# Patient Record
Sex: Male | Born: 1955 | Race: White | Hispanic: No | Marital: Single | Smoking: Former smoker
Health system: Southern US, Community
[De-identification: ages and names within clinical notes are randomized; demographics above are authoritative.]

## PROBLEM LIST (undated history)

## (undated) DIAGNOSIS — I2510 Atherosclerotic heart disease of native coronary artery without angina pectoris: Secondary | ICD-10-CM

## (undated) DIAGNOSIS — I209 Angina pectoris, unspecified: Secondary | ICD-10-CM

## (undated) DIAGNOSIS — I10 Essential (primary) hypertension: Secondary | ICD-10-CM

## (undated) DIAGNOSIS — J45909 Unspecified asthma, uncomplicated: Secondary | ICD-10-CM

## (undated) DIAGNOSIS — J449 Chronic obstructive pulmonary disease, unspecified: Secondary | ICD-10-CM

## (undated) DIAGNOSIS — D649 Anemia, unspecified: Secondary | ICD-10-CM

## (undated) DIAGNOSIS — G709 Myoneural disorder, unspecified: Secondary | ICD-10-CM

## (undated) DIAGNOSIS — G4730 Sleep apnea, unspecified: Secondary | ICD-10-CM

## (undated) HISTORY — PX: CORONARY ANGIOPLASTY: SHX604

## (undated) HISTORY — PX: BARIATRIC SURGERY: SHX1103

## (undated) HISTORY — PX: PNEUMONECTOMY: SHX168

## (undated) SURGERY — Surgical Case
Anesthesia: *Unknown

---

## 2008-10-10 ENCOUNTER — Inpatient Hospital Stay: Payer: Self-pay | Admitting: Internal Medicine

## 2008-10-10 ENCOUNTER — Ambulatory Visit: Payer: Self-pay | Admitting: Family Medicine

## 2008-10-25 ENCOUNTER — Ambulatory Visit: Payer: Self-pay | Admitting: Internal Medicine

## 2008-10-28 ENCOUNTER — Ambulatory Visit: Payer: Self-pay | Admitting: Internal Medicine

## 2008-12-21 ENCOUNTER — Ambulatory Visit: Payer: Self-pay | Admitting: Internal Medicine

## 2008-12-25 ENCOUNTER — Ambulatory Visit: Payer: Self-pay

## 2009-01-24 ENCOUNTER — Inpatient Hospital Stay: Payer: Self-pay | Admitting: General Surgery

## 2009-01-24 ENCOUNTER — Ambulatory Visit: Payer: Self-pay | Admitting: Internal Medicine

## 2009-02-06 ENCOUNTER — Ambulatory Visit: Payer: Self-pay | Admitting: General Surgery

## 2009-02-07 ENCOUNTER — Inpatient Hospital Stay: Payer: Self-pay | Admitting: General Surgery

## 2009-02-16 ENCOUNTER — Ambulatory Visit: Payer: Self-pay | Admitting: General Surgery

## 2009-02-19 ENCOUNTER — Ambulatory Visit: Payer: Self-pay | Admitting: General Surgery

## 2009-02-22 ENCOUNTER — Ambulatory Visit: Payer: Self-pay | Admitting: General Surgery

## 2009-02-26 ENCOUNTER — Ambulatory Visit: Payer: Self-pay | Admitting: General Surgery

## 2009-02-26 ENCOUNTER — Ambulatory Visit: Payer: Self-pay | Admitting: Unknown Physician Specialty

## 2009-02-27 ENCOUNTER — Ambulatory Visit: Payer: Self-pay | Admitting: General Surgery

## 2009-02-28 ENCOUNTER — Inpatient Hospital Stay: Payer: Self-pay | Admitting: General Surgery

## 2009-03-15 ENCOUNTER — Ambulatory Visit: Payer: Self-pay | Admitting: General Surgery

## 2009-04-13 ENCOUNTER — Ambulatory Visit: Payer: Self-pay | Admitting: General Surgery

## 2009-12-12 ENCOUNTER — Ambulatory Visit: Payer: Self-pay | Admitting: Pain Medicine

## 2010-01-18 IMAGING — CR DG CHEST 2V
1 series · 2 of 2 positions shown · non-contrast
Comparison: none

REASON FOR EXAM: pneumothorax, dyspnea
COMMENTS:

[Series 1: view not recorded · 0.17mm/px · 2 of 2 slices shown]
[im 1/2]
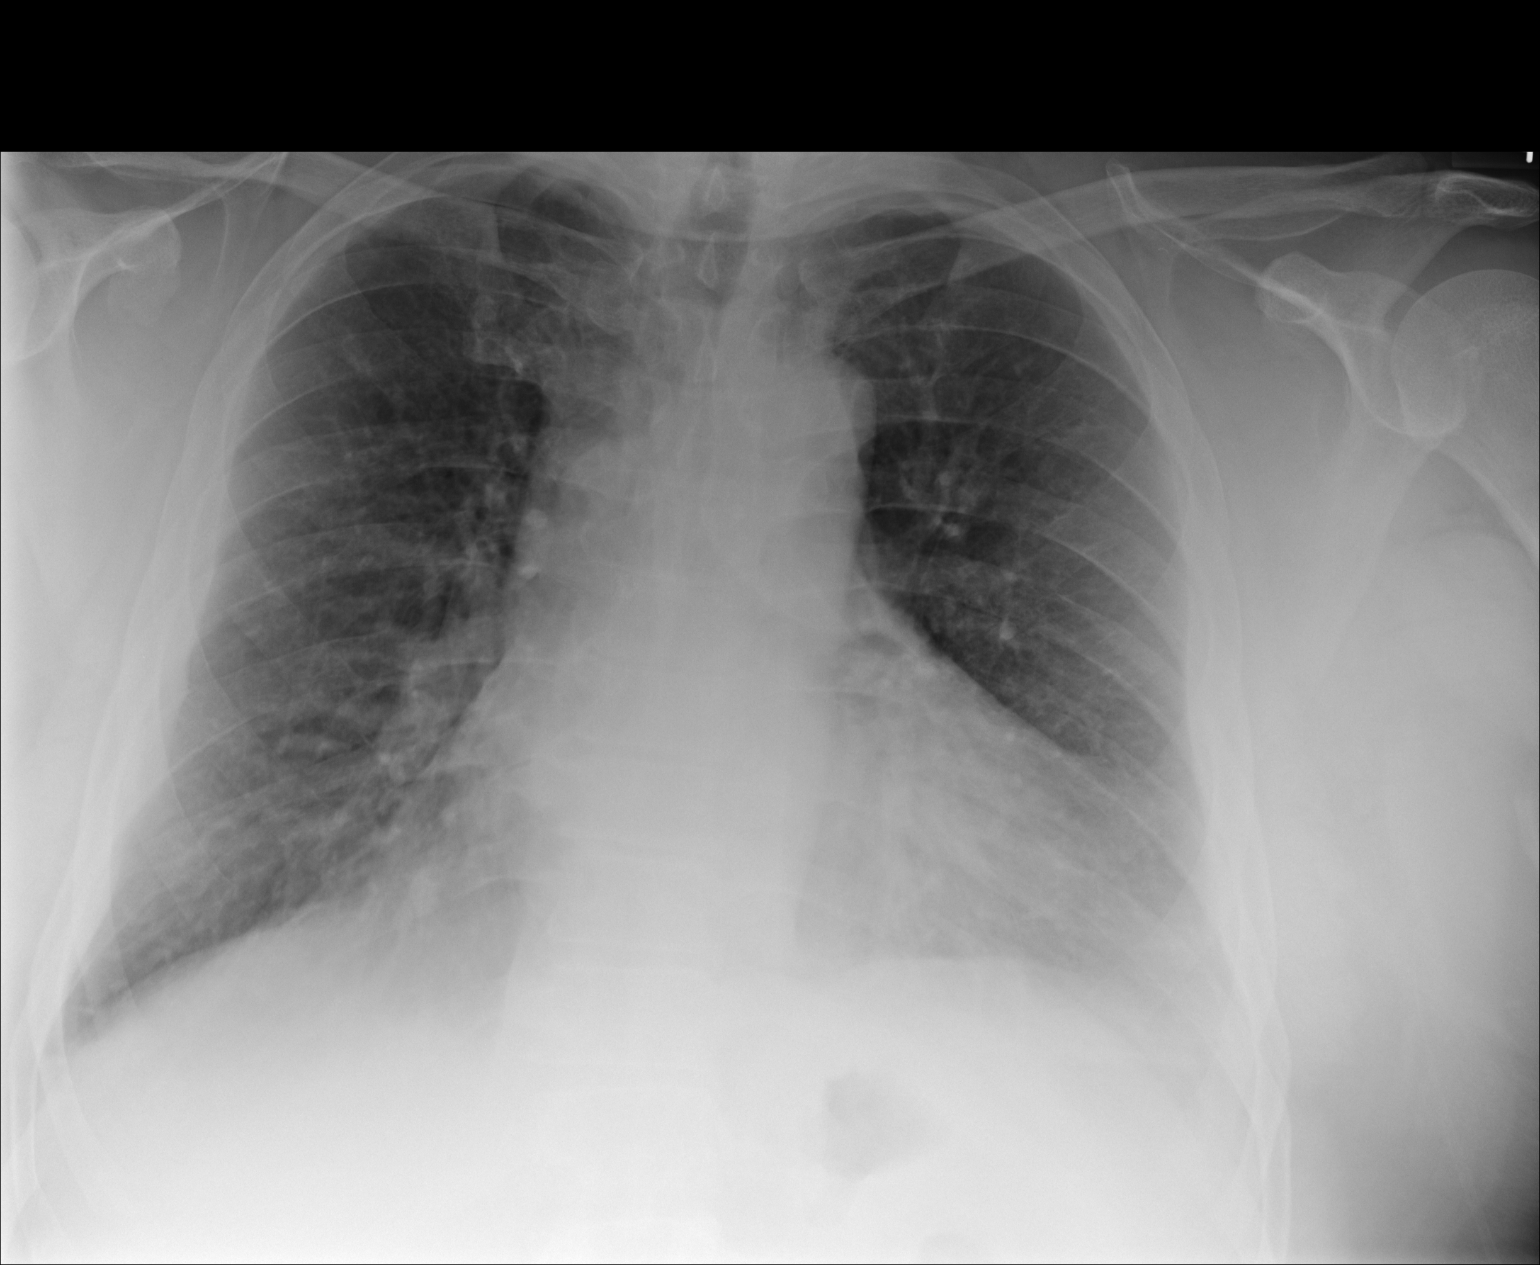
[im 2/2]
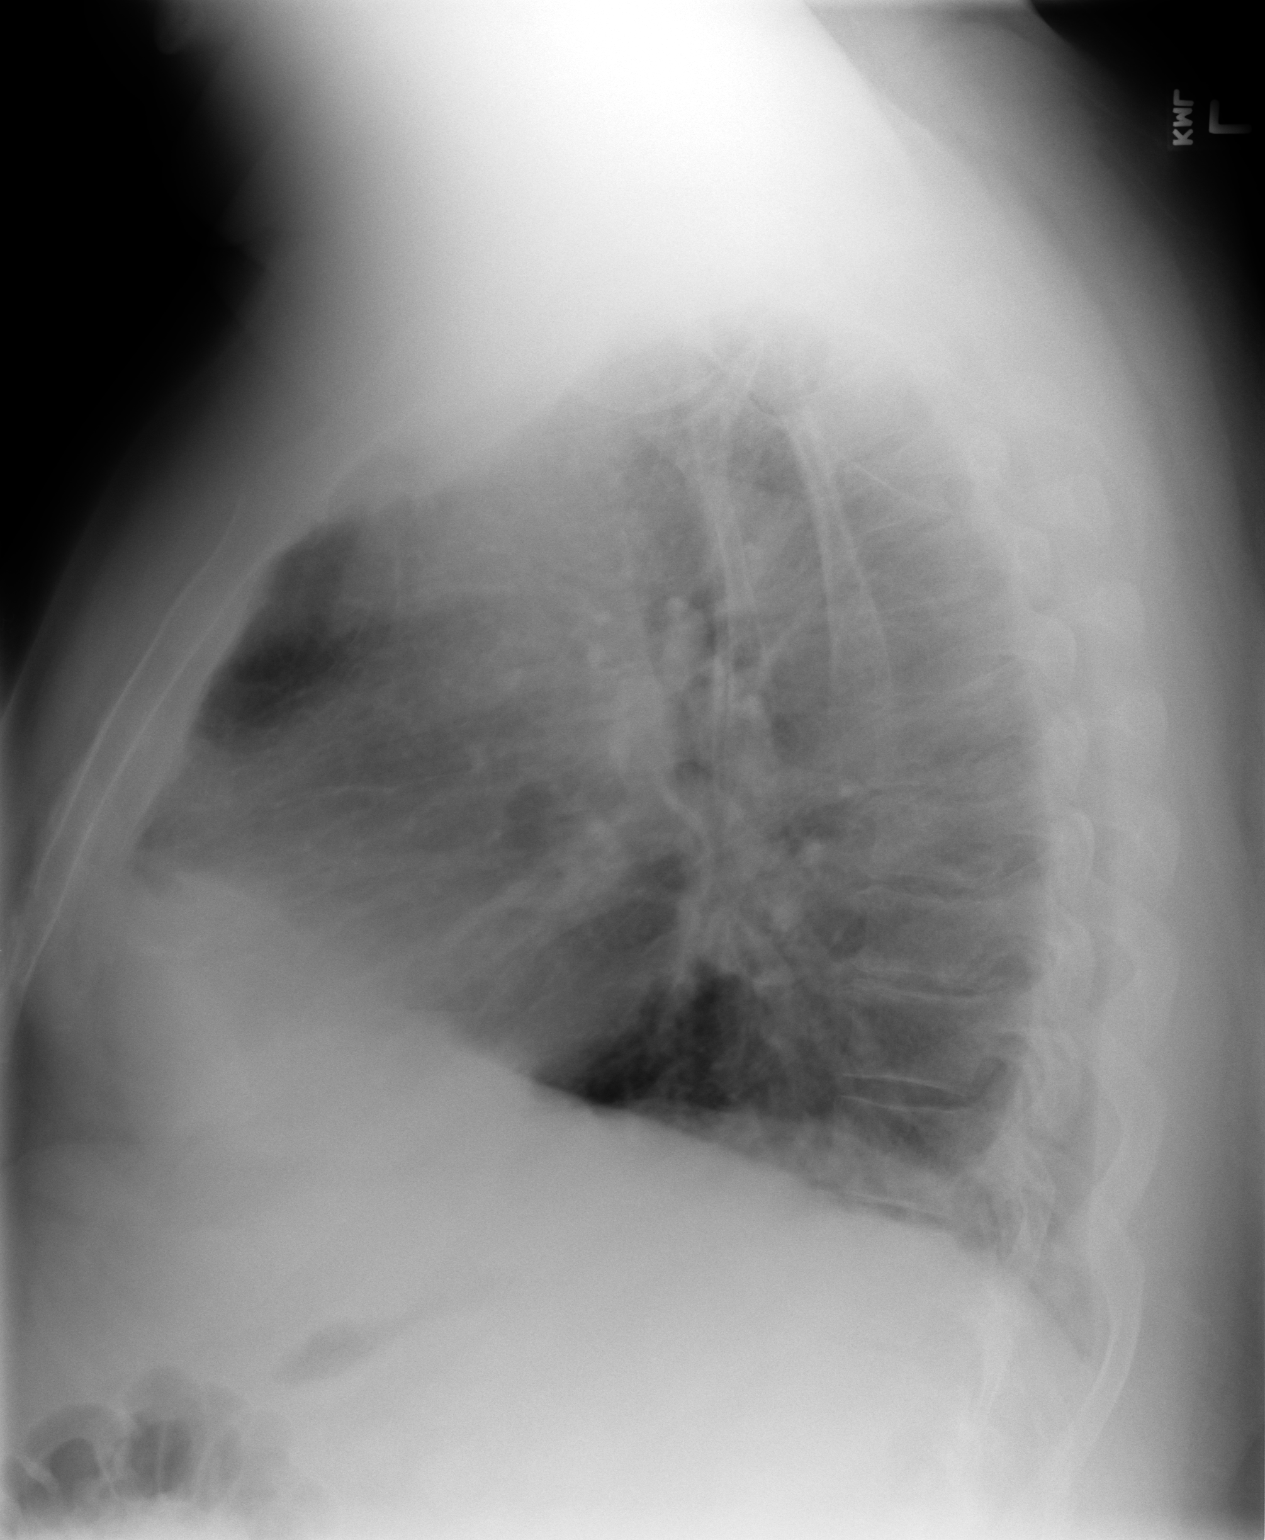

[2 of 2 positions shown; findings below may reference images not displayed]

PROCEDURE:     MDR - MDR CHEST PA(OR AP) AND LATERAL  - October 25, 2008  [DATE]

RESULT:     Comparison is made to the examination of 10/14/2008.

The cardiac silhouette is enlarged. The pulmonary markings are somewhat
prominent. There is some mild peribronchial cuffing and there is evidence of
interstitial edema. There is no significant effusion.
IMPRESSION: Cardiomegaly with mild pulmonary edema. No significant
change.

## 2010-05-05 IMAGING — CR DG CHEST 1V PORT
1 series · 1 of 1 positions shown · non-contrast
Comparison: none

REASON FOR EXAM: chest tube removed today, possible leak
COMMENTS:

PROCEDURE:     DXR - DXR PORTABLE CHEST SINGLE VIEW  - February 09, 2009  [DATE]
RESULT:     Comparison: 02/09/2009

[view not recorded]
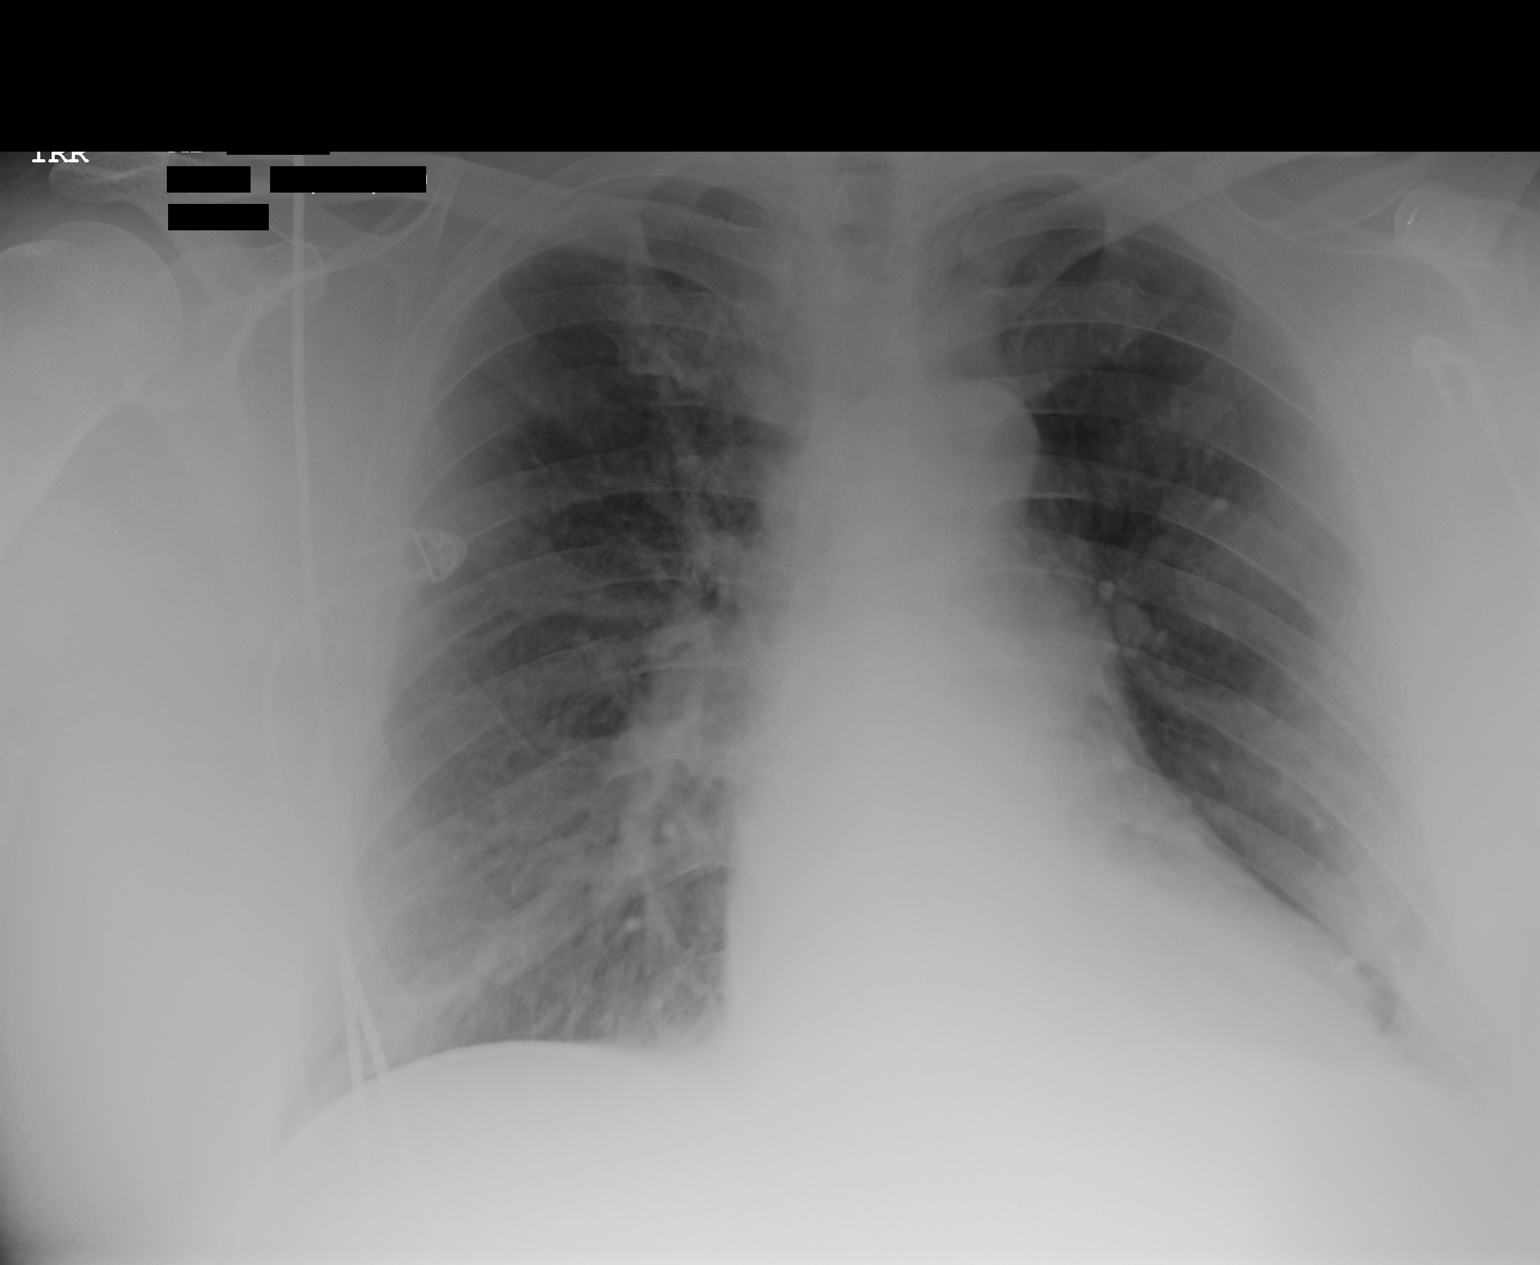

[1 of 1 positions shown; findings below may reference images not displayed]

FINDINGS: Single portable AP chest radiograph is provided. There has been interval
removal of an endotracheal tube. There is left basilar airspace disease
likely representing atelectasis. There is no focal parenchymal opacity,
pleural effusion, or pneumothorax. Normal cardiomediastinal silhouette. The
osseous structures are unremarkable.
IMPRESSION: No left pneumothorax.

## 2010-05-08 IMAGING — CR DG CHEST 1V PORT
1 series · 1 of 1 positions shown · non-contrast
Comparison: none

REASON FOR EXAM: hypoxia
COMMENTS:

PROCEDURE:     DXR - DXR PORTABLE CHEST SINGLE VIEW  - February 12, 2009 [DATE]
RESULT:     There is a minimal hazy density in the left mid lung field
compatible with mild atelectasis. No pneumothorax is seen. No acute changes
of the heart or pulmonary vasculature are identified.

[view not recorded]
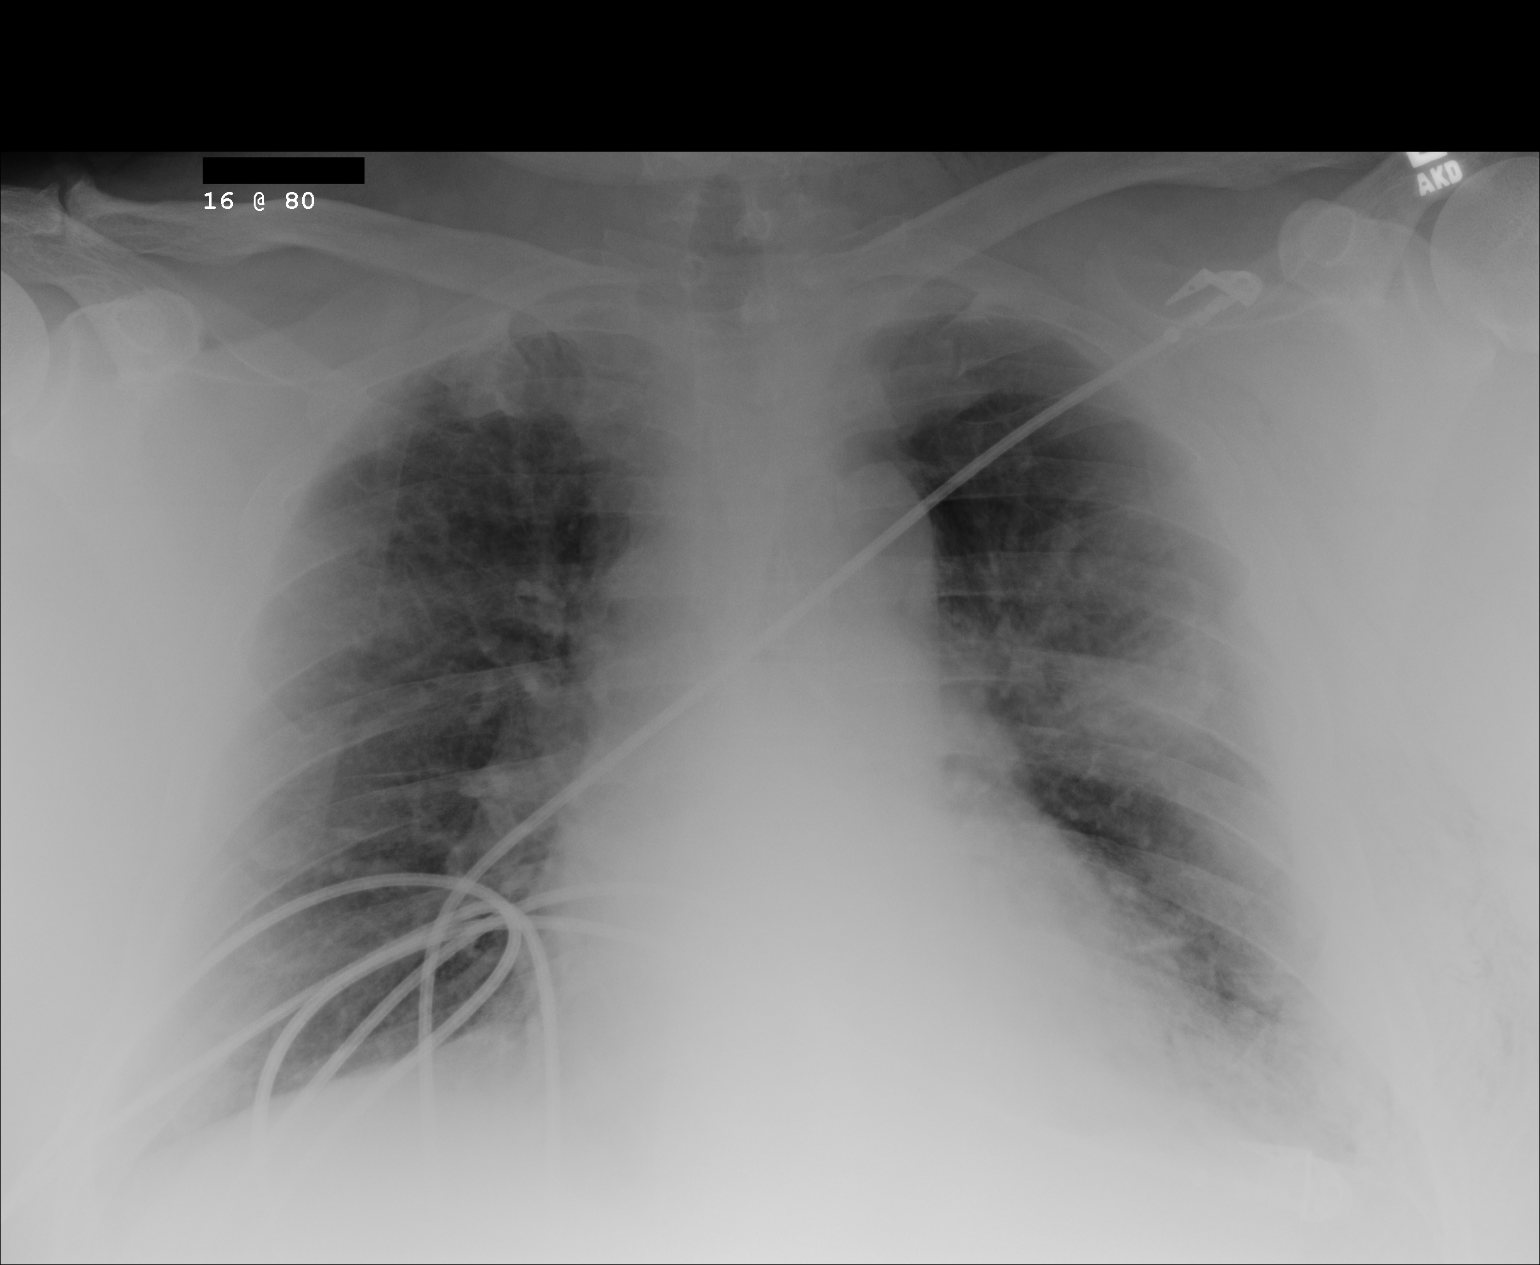

[1 of 1 positions shown; findings below may reference images not displayed]

IMPRESSION: There is a minimal hazy increase in density in the left mid
lung field most compatible with mild atelectasis. The lung fields otherwise
are clear.

## 2010-05-23 IMAGING — CR DG CHEST 2V
1 series · 3 of 3 positions shown · non-contrast
Comparison: none

REASON FOR EXAM: Shortness of breath, chest tube placement
COMMENTS:

[Series 1: view not recorded · 0.17mm/px · 3 of 3 slices shown]
[im 1/3]
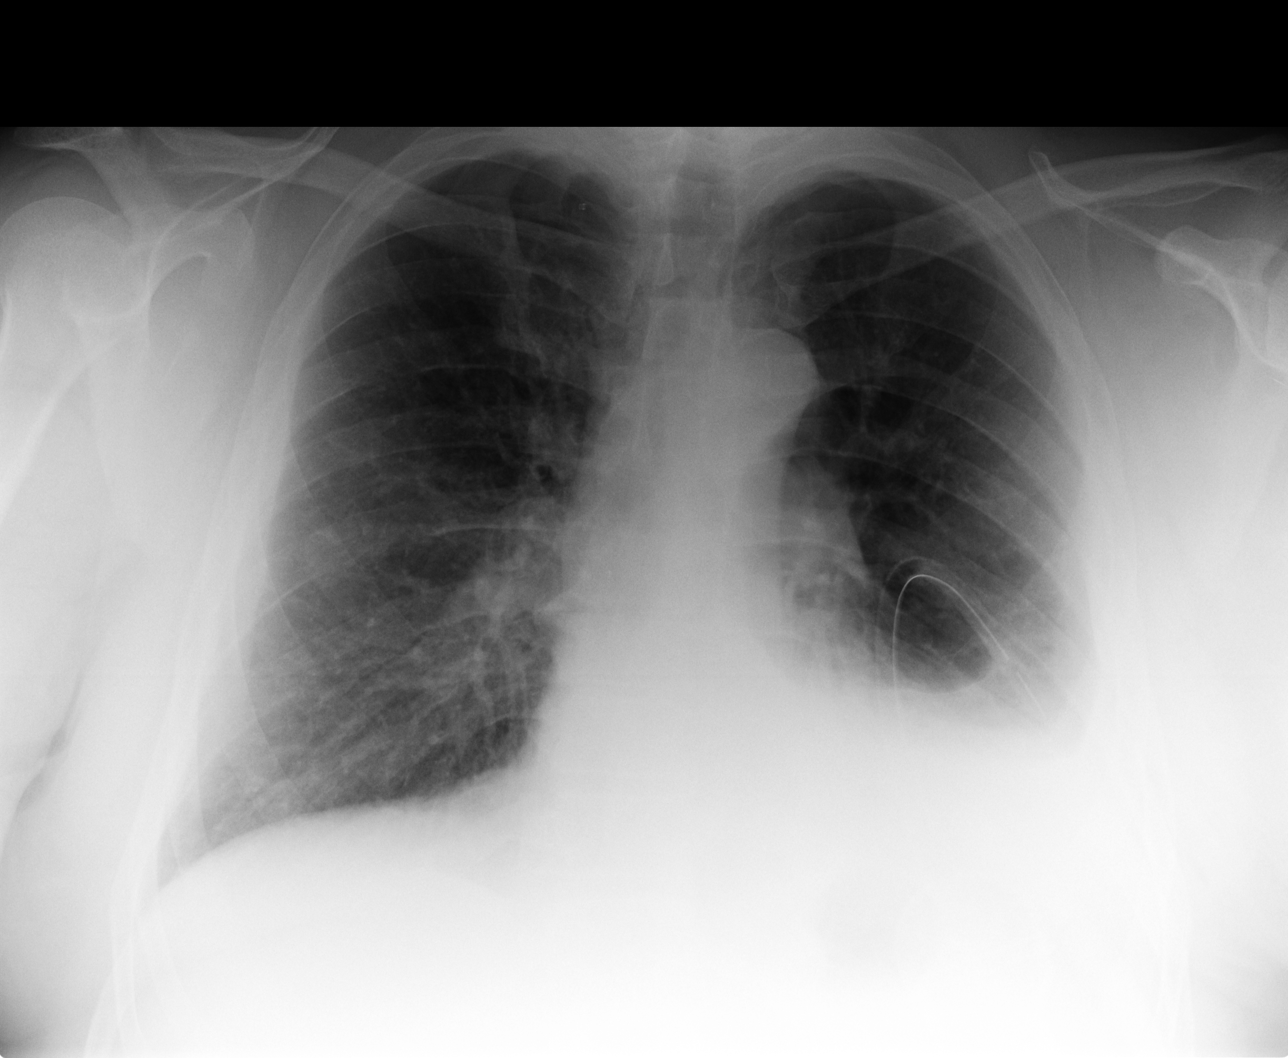
[im 2/3]
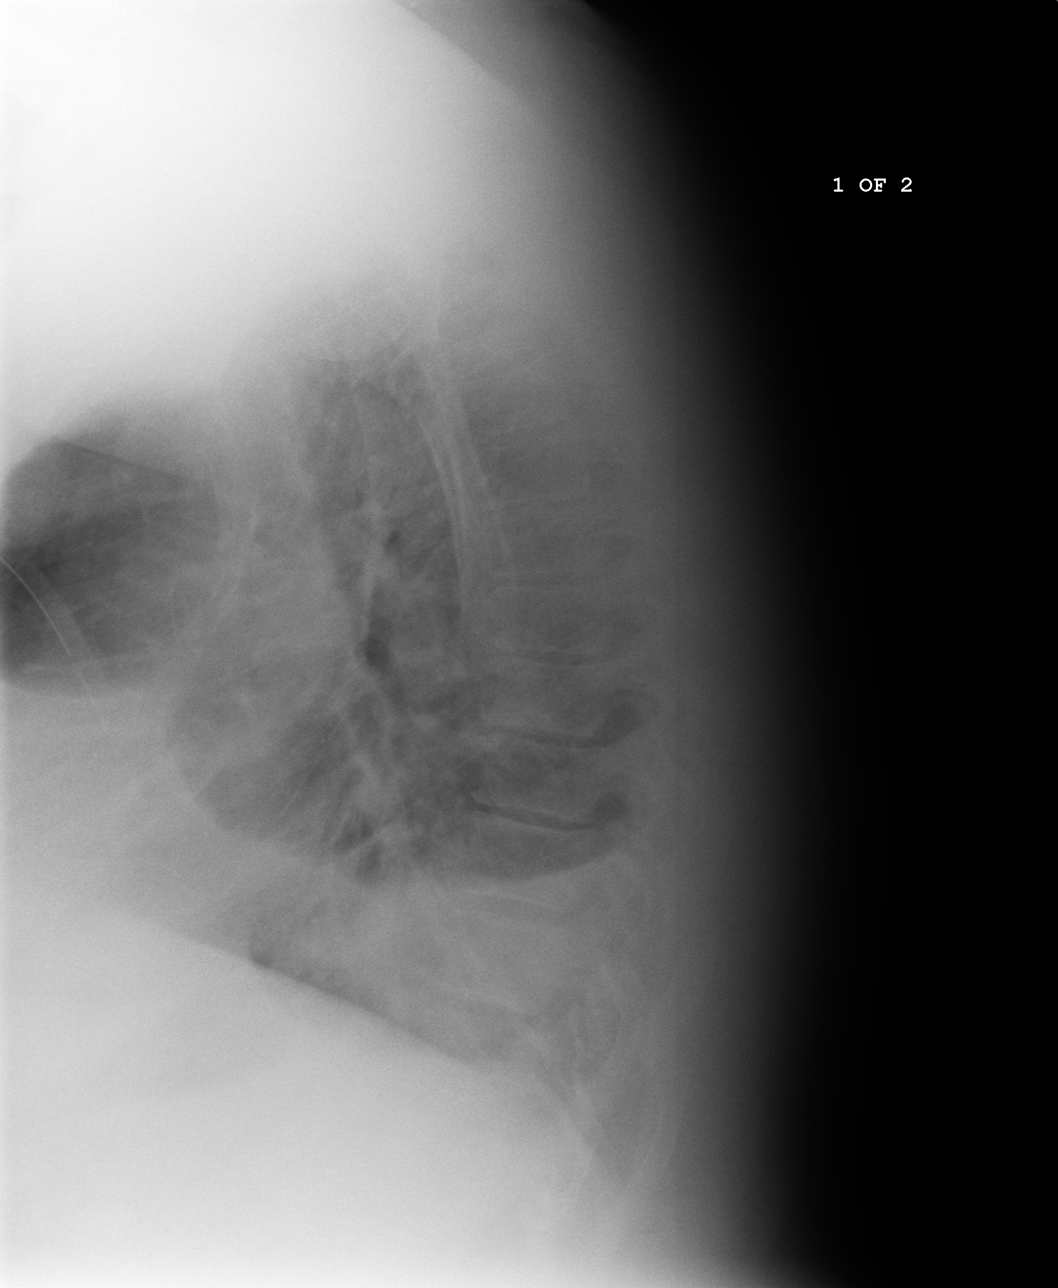
[im 3/3]
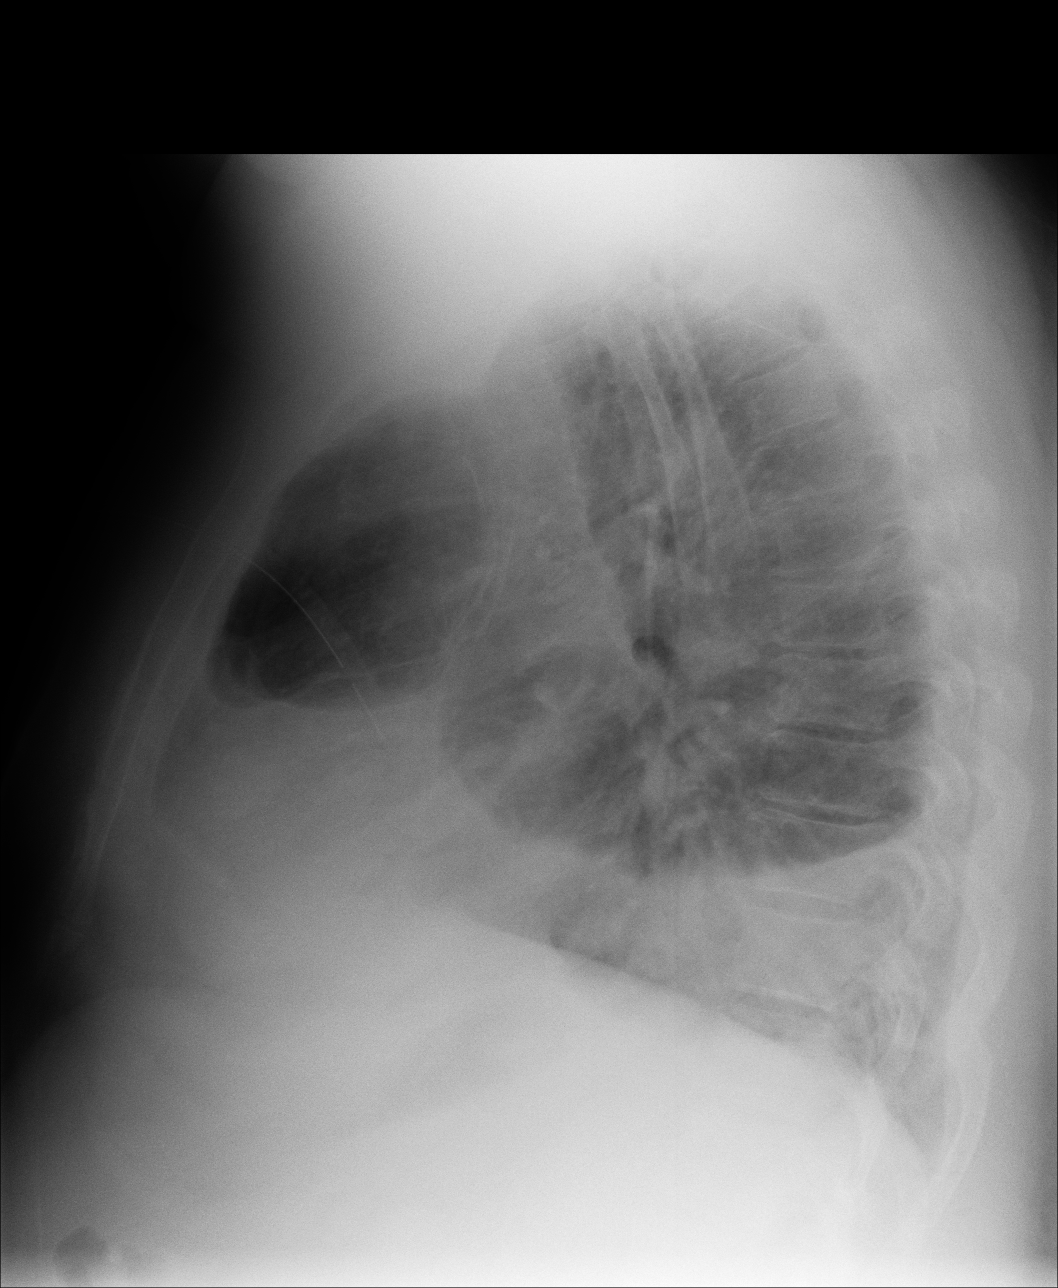

[3 of 3 positions shown; findings below may reference images not displayed]

PROCEDURE:     KDR - KDXR CHEST PA (OR AP) AND LAT  - February 27, 2009  [DATE]

RESULT:     Comparison is made to prior study dated 02/26/2009.

A left-sided chest tube has been placed in the interim. The tube tip is
positioned within the left lung base. A small residual left pleural effusion
is identified. The previously described pneumothorax is not appreciated on
the present study. The patient has taken a shallow inspiration. There is
thickening of the interstitial markings. The cardiac silhouette is within
the upper limits of normal. The visualized bony skeleton is unremarkable.
IMPRESSION: 1. Interval placement of left-sided chest tube.
2. Residual left pleural effusion without associated pneumothorax.
3. Pulmonary vascular congestion/mild edema.

## 2010-05-25 IMAGING — CR DG CHEST 1V PORT
1 series · 1 of 1 positions shown · non-contrast
Comparison: none

REASON FOR EXAM: left thoracoscopy
COMMENTS:

[view not recorded]
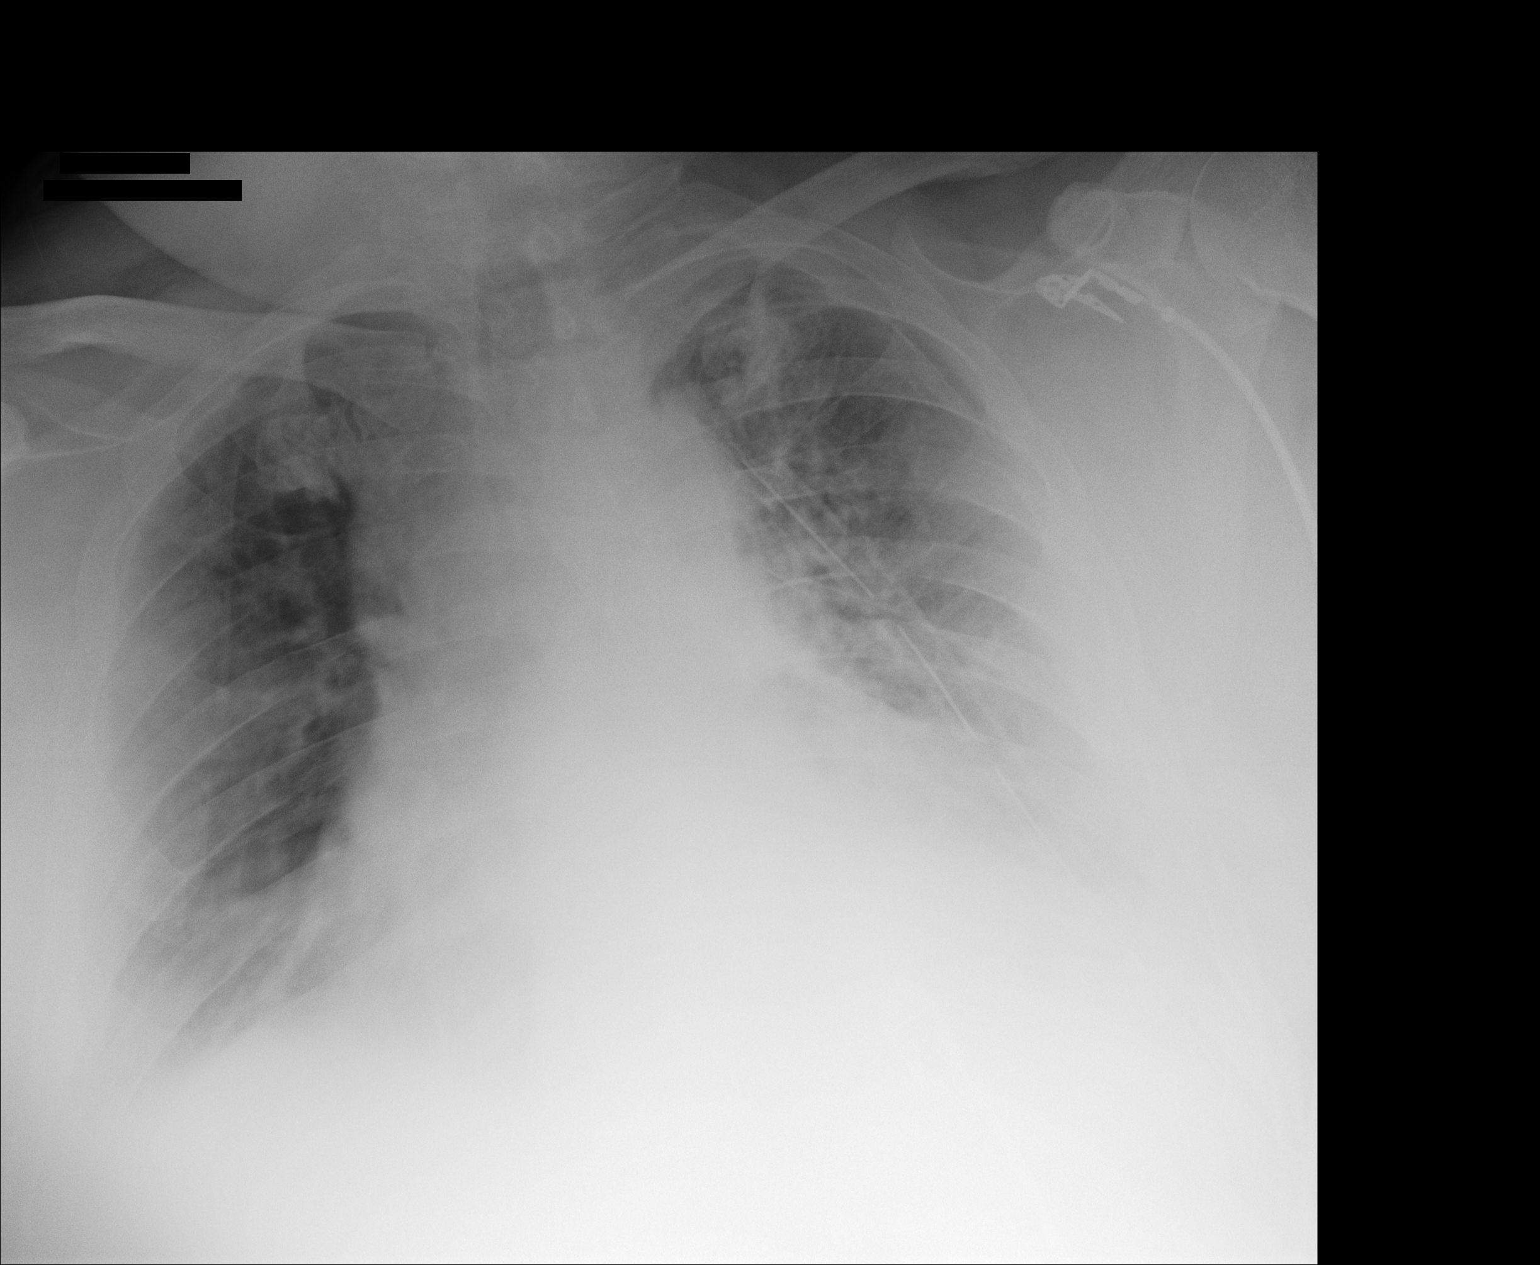

[1 of 1 positions shown; findings below may reference images not displayed]

PROCEDURE:     DXR - DXR PORTABLE CHEST SINGLE VIEW  - March 01, 2009  [DATE]

RESULT:     Portable AP view of the chest is compared to the prior exam of
02/28/2009. There persists prominence of the pulmonary vascularity compatible
with pulmonary vascular congestion and interstitial edema. The heart is
enlarged but stable in size as compared to the prior exam. A left chest tube
remains present. There is a trace pneumothorax on the left where there is
noted approximately 6 mm separation of the visceral and parietal pleura at
the left upper lobe laterally. The previously present endotracheal tube is
no longer seen.
IMPRESSION: 1.     Please see above.

## 2010-06-24 ENCOUNTER — Emergency Department: Payer: Self-pay | Admitting: Emergency Medicine

## 2011-09-12 ENCOUNTER — Ambulatory Visit: Payer: Self-pay | Admitting: Internal Medicine

## 2012-10-15 ENCOUNTER — Ambulatory Visit: Payer: Self-pay | Admitting: Specialist

## 2012-10-15 LAB — IRON AND TIBC
Iron: 96 ug/dL (ref 65–175)
Unbound Iron-Bind.Cap.: 375 ug/dL

## 2012-10-15 LAB — COMPREHENSIVE METABOLIC PANEL
Albumin: 3.8 g/dL (ref 3.4–5.0)
BUN: 21 mg/dL — ABNORMAL HIGH (ref 7–18)
Calcium, Total: 9.4 mg/dL (ref 8.5–10.1)
Co2: 30 mmol/L (ref 21–32)
Creatinine: 1.13 mg/dL (ref 0.60–1.30)
EGFR (African American): >60
EGFR (Non-African Amer.): >60
Potassium: 3.9 mmol/L (ref 3.5–5.1)
SGOT(AST): 23 U/L (ref 15–37)
Sodium: 137 mmol/L (ref 136–145)

## 2012-10-15 LAB — LIPASE, BLOOD: Lipase: 94 U/L (ref 73–393)

## 2012-10-15 LAB — MAGNESIUM: Magnesium: 1.8 mg/dL

## 2012-10-15 LAB — HEMOGLOBIN A1C: Hemoglobin A1C: 5.6 % (ref 4.2–6.3)

## 2012-10-15 LAB — TSH: Thyroid Stimulating Horm: 1.75 u[IU]/mL

## 2012-10-15 LAB — APTT: Activated PTT: 39 secs — ABNORMAL HIGH (ref 23.6–35.9)

## 2012-10-15 LAB — PROTIME-INR
INR: 1
Prothrombin Time: 13.6 secs (ref 11.5–14.7)

## 2012-11-17 ENCOUNTER — Ambulatory Visit: Payer: Self-pay | Admitting: Specialist

## 2012-11-26 ENCOUNTER — Ambulatory Visit: Payer: Self-pay | Admitting: Specialist

## 2012-12-04 ENCOUNTER — Emergency Department: Payer: Self-pay | Admitting: Emergency Medicine

## 2012-12-21 ENCOUNTER — Ambulatory Visit: Payer: Self-pay | Admitting: Internal Medicine

## 2012-12-23 ENCOUNTER — Ambulatory Visit: Payer: Self-pay | Admitting: Specialist

## 2012-12-23 LAB — CBC WITH DIFFERENTIAL/PLATELET
Basophil #: 0.1 10*3/uL (ref 0.0–0.1)
Eosinophil #: 0.2 10*3/uL (ref 0.0–0.7)
Eosinophil %: 1.6 %
HGB: 13 g/dL (ref 13.0–18.0)
Lymphocyte #: 2.8 10*3/uL (ref 1.0–3.6)
Lymphocyte %: 30.3 %
MCH: 28.1 pg (ref 26.0–34.0)
MCV: 83 fL (ref 80–100)
Monocyte #: 0.7 x10 3/mm (ref 0.2–1.0)
Monocyte %: 7.6 %
Neutrophil #: 5.6 10*3/uL (ref 1.4–6.5)
Neutrophil %: 59.8 %
Platelet: 265 10*3/uL (ref 150–440)
RDW: 15.4 % — ABNORMAL HIGH (ref 11.5–14.5)
WBC: 9.4 10*3/uL (ref 3.8–10.6)

## 2013-01-04 ENCOUNTER — Inpatient Hospital Stay: Payer: Self-pay | Admitting: Specialist

## 2013-01-05 LAB — CBC WITH DIFFERENTIAL/PLATELET
Basophil #: 0 10*3/uL (ref 0.0–0.1)
Eosinophil #: 0.2 10*3/uL (ref 0.0–0.7)
HGB: 11.9 g/dL — ABNORMAL LOW (ref 13.0–18.0)
Lymphocyte %: 27.1 %
Monocyte #: 0.8 x10 3/mm (ref 0.2–1.0)
Neutrophil #: 5.7 10*3/uL (ref 1.4–6.5)
Neutrophil %: 61.9 %
Platelet: 202 10*3/uL (ref 150–440)
RBC: 4.24 10*6/uL — ABNORMAL LOW (ref 4.40–5.90)
RDW: 15.4 % — ABNORMAL HIGH (ref 11.5–14.5)

## 2013-01-05 LAB — MAGNESIUM: Magnesium: 1.8 mg/dL

## 2013-01-05 LAB — BASIC METABOLIC PANEL
Chloride: 107 mmol/L (ref 98–107)
Co2: 29 mmol/L (ref 21–32)
EGFR (African American): >60
EGFR (Non-African Amer.): >60
Potassium: 3.6 mmol/L (ref 3.5–5.1)

## 2013-01-05 LAB — PATHOLOGY REPORT

## 2013-01-05 LAB — ALBUMIN: Albumin: 3.3 g/dL — ABNORMAL LOW (ref 3.4–5.0)

## 2013-01-31 ENCOUNTER — Ambulatory Visit: Payer: Self-pay | Admitting: Specialist

## 2013-02-26 ENCOUNTER — Ambulatory Visit: Payer: Self-pay | Admitting: Specialist

## 2013-07-13 ENCOUNTER — Emergency Department: Payer: Self-pay | Admitting: Emergency Medicine

## 2013-07-13 LAB — COMPREHENSIVE METABOLIC PANEL
ALBUMIN: 3.8 g/dL (ref 3.4–5.0)
Alkaline Phosphatase: 111 U/L
Anion Gap: 5 — ABNORMAL LOW (ref 7–16)
BUN: 14 mg/dL (ref 7–18)
Bilirubin,Total: 0.6 mg/dL (ref 0.2–1.0)
CO2: 30 mmol/L (ref 21–32)
Calcium, Total: 9.9 mg/dL (ref 8.5–10.1)
Chloride: 103 mmol/L (ref 98–107)
Creatinine: 0.84 mg/dL (ref 0.60–1.30)
EGFR (African American): >60
EGFR (Non-African Amer.): >60
Glucose: 95 mg/dL (ref 65–99)
Osmolality: 276 (ref 275–301)
Potassium: 3.7 mmol/L (ref 3.5–5.1)
SGOT(AST): 15 U/L (ref 15–37)
SGPT (ALT): 26 U/L (ref 12–78)
Sodium: 138 mmol/L (ref 136–145)
Total Protein: 8.1 g/dL (ref 6.4–8.2)

## 2013-07-13 LAB — CBC WITH DIFFERENTIAL/PLATELET
Basophil #: 0 10*3/uL (ref 0.0–0.1)
Basophil %: 0.3 %
Eosinophil #: 0.1 10*3/uL (ref 0.0–0.7)
Eosinophil %: 0.5 %
HCT: 41.3 % (ref 40.0–52.0)
HGB: 13.7 g/dL (ref 13.0–18.0)
LYMPHS ABS: 1.9 10*3/uL (ref 1.0–3.6)
Lymphocyte %: 12.2 %
MCH: 28.8 pg (ref 26.0–34.0)
MCHC: 33.2 g/dL (ref 32.0–36.0)
MCV: 87 fL (ref 80–100)
Monocyte #: 0.9 x10 3/mm (ref 0.2–1.0)
Monocyte %: 6 %
NEUTROS PCT: 81 %
Neutrophil #: 12.4 10*3/uL — ABNORMAL HIGH (ref 1.4–6.5)
PLATELETS: 242 10*3/uL (ref 150–440)
RBC: 4.76 10*6/uL (ref 4.40–5.90)
RDW: 13.8 % (ref 11.5–14.5)
WBC: 15.3 10*3/uL — ABNORMAL HIGH (ref 3.8–10.6)

## 2013-07-13 LAB — URINALYSIS, COMPLETE
Bacteria: NONE SEEN
Bilirubin,UR: NEGATIVE
Blood: NEGATIVE
Glucose,UR: NEGATIVE mg/dL (ref 0–75)
LEUKOCYTE ESTERASE: NEGATIVE
NITRITE: NEGATIVE
PH: 6 (ref 4.5–8.0)
PROTEIN: NEGATIVE
SPECIFIC GRAVITY: 1.021 (ref 1.003–1.030)
SQUAMOUS EPITHELIAL: NONE SEEN
WBC UR: <1 /HPF (ref 0–5)

## 2013-07-15 ENCOUNTER — Emergency Department: Payer: Self-pay | Admitting: Emergency Medicine

## 2013-07-15 LAB — URINALYSIS, COMPLETE
BILIRUBIN, UR: NEGATIVE
Bacteria: NONE SEEN
Glucose,UR: NEGATIVE mg/dL (ref 0–75)
KETONE: NEGATIVE
Nitrite: NEGATIVE
Ph: 6 (ref 4.5–8.0)
Protein: 30
SPECIFIC GRAVITY: 1.013 (ref 1.003–1.030)
Squamous Epithelial: NONE SEEN
WBC UR: 39 /HPF (ref 0–5)

## 2014-05-04 ENCOUNTER — Emergency Department: Payer: Self-pay | Admitting: Emergency Medicine

## 2014-05-04 LAB — CBC
HCT: 38.8 % — ABNORMAL LOW (ref 40.0–52.0)
HGB: 12.6 g/dL — ABNORMAL LOW (ref 13.0–18.0)
MCH: 28.1 pg (ref 26.0–34.0)
MCHC: 32.6 g/dL (ref 32.0–36.0)
MCV: 86 fL (ref 80–100)
Platelet: 168 10*3/uL (ref 150–440)
RBC: 4.49 10*6/uL (ref 4.40–5.90)
RDW: 13.7 % (ref 11.5–14.5)
WBC: 8.8 10*3/uL (ref 3.8–10.6)

## 2014-05-04 LAB — COMPREHENSIVE METABOLIC PANEL
ALT: 23 U/L
ANION GAP: 3 — AB (ref 7–16)
Albumin: 3.9 g/dL (ref 3.4–5.0)
Alkaline Phosphatase: 84 U/L
BUN: 21 mg/dL — AB (ref 7–18)
Bilirubin,Total: 0.4 mg/dL (ref 0.2–1.0)
CALCIUM: 9.6 mg/dL (ref 8.5–10.1)
CHLORIDE: 102 mmol/L (ref 98–107)
CO2: 32 mmol/L (ref 21–32)
Creatinine: 0.99 mg/dL (ref 0.60–1.30)
Glucose: 88 mg/dL (ref 65–99)
Osmolality: 276 (ref 275–301)
POTASSIUM: 5.5 mmol/L — AB (ref 3.5–5.1)
SGOT(AST): 15 U/L (ref 15–37)
Sodium: 137 mmol/L (ref 136–145)
Total Protein: 7.4 g/dL (ref 6.4–8.2)

## 2014-05-04 LAB — TROPONIN I: Troponin-I: <0.02 ng/mL

## 2014-08-18 NOTE — Op Note (Signed)
PATIENT NAME:  Spencer Herring, Spencer Herring MR#:  [id number] DATE OF BIRTH:  08-06-1955  DATE OF PROCEDURE:  01/04/2013  PREOPERATIVE DIAGNOSIS: Morbid obesity.   POSTOPERATIVE DIAGNOSIS: Morbid obesity.  PROCEDURE: Laparoscopic sleeve gastrectomy.   SURGEON: Kreg Shropshire, M.D.   ASSISTANT: Sarah (Dictation Anomaly) stout  COMPLICATIONS: None.   ANESTHESIA: General endotracheal.   CLINICAL HISTORY: See history and physical.   DETAILS OF PROCEDURE: PREOPERATIVE DIAGNOSIS: Morbid obesity.  POSTOPERATIVE DIAGNOSIS: Morbid obesity.  PROCEDURE:  Sleeve gastrectomy.  SURGEON: Kreg Shropshire, MD  ASSISTANT:  Zannie Kehr, PA  ANESTHESIA:  General endotracheal.  INDICATION:  See History and Physical.   COMPLICATIONS: None.  ESTIMATED BLOOD LOSS: None.  DETAILS OF PROCEDURE:  The patient was taken to the operating room and placed on the operating room table, in the supine position, with appropriate monitors and supplemental oxygen being delivered.  Broad spectrum IV antibiotics were administered. The patient was placed under general anesthesia without incident.  The abdomen was prepped and draped in the usual sterile fashion.  Access was obtained using 5 mm Optical trocar. Pneumoperitoneum was established without difficulty. Multiple other ports were placed in preparation for sleeve gastrectomy. A liver retractor was placed without incident. The entire stomach was mobilized from 5 cm from the pylorus all the way up to the fundus, and the fundus was mobilized off the left crura as well completely freeing up the posterior portion of the stomach. Posterior attachments were taken down so the crura could be visualized from both sides.  At that point, everything was removed from the stomach and a 62 [place] Bougie was placed down into the antrum. An Echelon green load stapler was used to bisect the antrum on first fire starting approximately 5 to 6 cm from the pylorus. I then continued up along the Bougie using a  blue load stapler with excellent affect with care not to get too close to the Bougie itself, with minimal traction. This continued all the way up to the left crura. The excess stomach was placed on the side and the Bougie was removed and endoscopy showed no evidence of obstruction at that time.  The excess stomach was removed through the abdominal cavity, and the wounds were closed using 4-0 Vicryl and Dermabond.   ____________________________ Kreg Shropshire, MD jb:aw D: 01/04/2013 08:56:29 ET T: 01/04/2013 09:03:29 ET JOB#: [id number]  cc: Kreg Shropshire, MD, <Dictator> Wille Glaser Langley Adie MD ELECTRONICALLY SIGNED 01/04/2013 11:17

## 2014-11-30 ENCOUNTER — Encounter: Payer: Self-pay | Admitting: *Deleted

## 2014-12-01 ENCOUNTER — Ambulatory Visit: Payer: No Typology Code available for payment source | Admitting: Anesthesiology

## 2014-12-01 ENCOUNTER — Encounter
Admission: RE | Disposition: A | Discharge status: Home / Self Care | Payer: Self-pay | Source: Ambulatory Visit | Attending: Gastroenterology

## 2014-12-01 ENCOUNTER — Ambulatory Visit: Admit: 2014-12-01 | Payer: Self-pay | Admitting: Gastroenterology

## 2014-12-01 ENCOUNTER — Encounter: Payer: Self-pay | Admitting: Anesthesiology

## 2014-12-01 ENCOUNTER — Ambulatory Visit
Admission: RE | Admit: 2014-12-01 | Discharge: 2014-12-01 | Disposition: A | Discharge status: Home / Self Care | Payer: No Typology Code available for payment source | Source: Ambulatory Visit | Attending: Gastroenterology | Admitting: Gastroenterology

## 2014-12-01 DIAGNOSIS — K5730 Diverticulosis of large intestine without perforation or abscess without bleeding: Secondary | ICD-10-CM | POA: Insufficient documentation

## 2014-12-01 DIAGNOSIS — J449 Chronic obstructive pulmonary disease, unspecified: Secondary | ICD-10-CM | POA: Insufficient documentation

## 2014-12-01 DIAGNOSIS — Z800 Family history of malignant neoplasm of digestive organs: Secondary | ICD-10-CM | POA: Insufficient documentation

## 2014-12-01 DIAGNOSIS — G4730 Sleep apnea, unspecified: Secondary | ICD-10-CM | POA: Diagnosis not present

## 2014-12-01 DIAGNOSIS — Z79899 Other long term (current) drug therapy: Secondary | ICD-10-CM | POA: Insufficient documentation

## 2014-12-01 DIAGNOSIS — J45909 Unspecified asthma, uncomplicated: Secondary | ICD-10-CM | POA: Insufficient documentation

## 2014-12-01 DIAGNOSIS — Z1211 Encounter for screening for malignant neoplasm of colon: Secondary | ICD-10-CM | POA: Diagnosis not present

## 2014-12-01 DIAGNOSIS — Z9884 Bariatric surgery status: Secondary | ICD-10-CM | POA: Diagnosis not present

## 2014-12-01 DIAGNOSIS — D649 Anemia, unspecified: Secondary | ICD-10-CM | POA: Insufficient documentation

## 2014-12-01 DIAGNOSIS — F329 Major depressive disorder, single episode, unspecified: Secondary | ICD-10-CM | POA: Insufficient documentation

## 2014-12-01 DIAGNOSIS — G709 Myoneural disorder, unspecified: Secondary | ICD-10-CM | POA: Insufficient documentation

## 2014-12-01 DIAGNOSIS — Z6830 Body mass index (BMI) 30.0-30.9, adult: Secondary | ICD-10-CM | POA: Diagnosis not present

## 2014-12-01 DIAGNOSIS — K644 Residual hemorrhoidal skin tags: Secondary | ICD-10-CM | POA: Insufficient documentation

## 2014-12-01 DIAGNOSIS — K633 Ulcer of intestine: Secondary | ICD-10-CM | POA: Insufficient documentation

## 2014-12-01 HISTORY — DX: Morbid (severe) obesity due to excess calories: E66.01

## 2014-12-01 HISTORY — DX: Anemia, unspecified: D64.9

## 2014-12-01 HISTORY — DX: Chronic obstructive pulmonary disease, unspecified: J44.9

## 2014-12-01 HISTORY — DX: Sleep apnea, unspecified: G47.30

## 2014-12-01 HISTORY — PX: COLONOSCOPY WITH PROPOFOL: SHX5780

## 2014-12-01 HISTORY — DX: Unspecified asthma, uncomplicated: J45.909

## 2014-12-01 HISTORY — DX: Myoneural disorder, unspecified: G70.9

## 2014-12-01 SURGERY — COLONOSCOPY WITH PROPOFOL
Anesthesia: General

## 2014-12-01 MED ORDER — GLYCOPYRROLATE 0.2 MG/ML IJ SOLN
INTRAMUSCULAR | Status: DC | PRN
  Administered 2014-12-01: 0.2 mg via INTRAVENOUS
  Administered 2014-12-01: .2 mg via INTRAVENOUS

## 2014-12-01 MED ORDER — LIDOCAINE HCL (CARDIAC) 20 MG/ML IV SOLN
INTRAVENOUS | Status: DC | PRN
  Administered 2014-12-01: 20 mg via INTRAVENOUS

## 2014-12-01 MED ORDER — PROPOFOL 10 MG/ML IV BOLUS
INTRAVENOUS | Status: DC | PRN
  Administered 2014-12-01: 80 mg via INTRAVENOUS

## 2014-12-01 MED ORDER — SODIUM CHLORIDE 0.9 % IV SOLN: INTRAVENOUS | Status: DC

## 2014-12-01 MED ORDER — PHENYLEPHRINE HCL 10 MG/ML IJ SOLN
INTRAMUSCULAR | Status: DC | PRN
  Administered 2014-12-01 (×4): 100 ug via INTRAVENOUS

## 2014-12-01 MED ORDER — PROPOFOL INFUSION 10 MG/ML OPTIME
INTRAVENOUS | Status: DC | PRN
  Administered 2014-12-01: 120 ug/kg/min via INTRAVENOUS

## 2014-12-01 MED ORDER — SODIUM CHLORIDE 0.9 % IV SOLN
INTRAVENOUS | Status: DC
  Administered 2014-12-01: 1000 mL via INTRAVENOUS

## 2014-12-01 NOTE — Op Note (Signed)
[facility] Gastroenterology Patient Name: Spencer Herring Procedure Date: 12/01/2014 11:50 AM MRN: [id number] Account #: [id number] Date of Birth: 26-Sep-1955 Admit Type: Outpatient Age: 59 Room: [facility] ENDO ROOM 1 Gender: Male Note Status: Finalized Procedure:         Colonoscopy Indications:       Screening in patient at increased risk: Family history of                     1st-degree relative with colorectal cancer before age 11                     years, Last colonoscopy: 1990 Patient Profile:   This is a 59 year old male. Providers:         Gerrit Heck. Rayann Heman, MD Referring MD:      Tracie Harrier, MD (Referring MD) Medicines:         Propofol per Anesthesia Complications:     No immediate complications. Procedure:         Pre-Anesthesia Assessment:                    - Prior to the procedure, a History and Physical was                     performed, and patient medications, allergies and                     sensitivities were reviewed. The patient's tolerance of                     previous anesthesia was reviewed.                    After obtaining informed consent, the colonoscope was                     passed under direct vision. Throughout the procedure, the                     patient's blood pressure, pulse, and oxygen saturations                     were monitored continuously. The Colonoscope was                     introduced through the anus and advanced to the the cecum,                     identified by appendiceal orifice and ileocecal valve. The                     colonoscopy was somewhat difficult due to a tortuous                     colon. Successful completion of the procedure was aided by                     using manual pressure. The patient tolerated the procedure                     well. The quality of the bowel preparation was excellent. Findings:      The perianal exam findings include non-thrombosed external hemorrhoids.      2 ten mm  ulcers were found at the hepatic flexure. No bleeding  was       present. Biopsies were taken with a cold forceps for histology. Area was       tattooed with an injection of 3 mL of [place] ink.      A few small-mouthed diverticula were found in the sigmoid colon.      The exam was otherwise without abnormality on direct and retroflexion       views.      Multiple biopsies were obtained with cold forceps for histology randomly       in the proximal transverse colon and in the ascending colon.      Multiple biopsies were obtained with cold forceps for histology randomly       in the rectum and in the sigmoid colon. Impression:        - Non-thrombosed external hemorrhoids found on perianal                     exam.                    - 2 ulcers at the hepatic flexure. Biopsied. Tattooed just                     distla to the ulcers. ? Ischemia vs IBD vs malignancy                    - Diverticulosis in the sigmoid colon.                    - The examination was otherwise normal on direct and                     retroflexion views.                    - Multiple biopsies were obtained in the proximal                     transverse colon and in the ascending colon.                    - Multiple biopsies were obtained in the rectum and in the                     sigmoid colon. Recommendation:    - Observe patient in GI recovery unit.                    - Resume regular diet.                    - Continue present medications.                    - Await pathology results.                    - If ulcer biopsies are non-diagnostic, will need CT vs                     repeat colonoscopy for re-biopsy.                    - Repeat colonoscopy in 5 years for screening purposes.                    - Return to GI clinic.                    -  The findings and recommendations were discussed with the                     patient.                    - The findings and recommendations were discussed with the                      patient's family. Procedure Code(s): --- Professional ---                    [id number], Colonoscopy, flexible; with biopsy, single or                     multiple                    45381, Colonoscopy, flexible; with directed submucosal                     injection(s), any substance CPT copyright 2014 American Medical Association. All rights reserved. The codes documented in this report are preliminary and upon coder review may  be revised to meet current compliance requirements. Mellody Life, MD 12/01/2014 12:36:52 PM This report has been signed electronically. Number of Addenda: 0 Note Initiated On: 12/01/2014 11:50 AM Scope Withdrawal Time: 0 hours 24 minutes 54 seconds  Total Procedure Duration: 0 hours 31 minutes 24 seconds       [facility]

## 2014-12-01 NOTE — Anesthesia Postprocedure Evaluation (Signed)
  Anesthesia Post-op Note  Patient: Spencer Herring  Procedure(s) Performed: Procedure(s): COLONOSCOPY WITH PROPOFOL (N/A)  Anesthesia type:General  Patient location: PACU  Post pain: Pain level controlled  Post assessment: Post-op Vital signs reviewed, Patient's Cardiovascular Status Stable, Respiratory Function Stable, Patent Airway and No signs of Nausea or vomiting  Post vital signs: Reviewed and stable  Last Vitals:  Filed Vitals:   12/01/14 1257  BP: 113/77  Pulse: 52  Temp:   Resp: 12    Level of consciousness: awake, alert  and patient cooperative  Complications: No apparent anesthesia complications

## 2014-12-01 NOTE — Anesthesia Preprocedure Evaluation (Addendum)
Anesthesia Evaluation  Patient identified by MRN, date of birth, ID band Patient awake    Reviewed: Allergy & Precautions, H&P , NPO status , Patient's Chart, lab work & pertinent test results, reviewed documented beta blocker date and time   Airway Mallampati: III  TM Distance: >3 FB Neck ROM: full    Dental  (+) Poor Dentition, Chipped   Pulmonary asthma , sleep apnea , COPD breath sounds clear to auscultation  Pulmonary exam normal       Cardiovascular negative cardio ROS Normal cardiovascular examRhythm:regular Rate:Normal     Neuro/Psych PSYCHIATRIC DISORDERS  Neuromuscular disease negative psych ROS   GI/Hepatic negative GI ROS, Neg liver ROS,   Endo/Other  negative endocrine ROS  Renal/GU negative Renal ROS  negative genitourinary   Musculoskeletal   Abdominal   Peds  Hematology negative hematology ROS (+)   Anesthesia Other Findings Past Medical History:   COPD (chronic obstructive pulmonary disease)                 Anemia                                                       Neuromuscular disorder                                       Sleep apnea                                                  Depression                                                   Asthma                                                       Morbid obesity                                               Reproductive/Obstetrics negative OB ROS                            Anesthesia Physical Anesthesia Plan  ASA: III  Anesthesia Plan: General   Post-op Pain Management:    Induction:   Airway Management Planned:   Additional Equipment:   Intra-op Plan:   Post-operative Plan:   Informed Consent: I have reviewed the patients History and Physical, chart, labs and discussed the procedure including the risks, benefits and alternatives for the proposed anesthesia with the patient or authorized  representative who has indicated his/her understanding and acceptance.   Dental Advisory Given  Plan Discussed with: Anesthesiologist, CRNA and  Surgeon  Anesthesia Plan Comments:         Anesthesia Quick Evaluation

## 2014-12-01 NOTE — H&P (Signed)
Primary Care Physician:  Tracie Harrier, MD  Pre-Procedure History & Physical: HPI:  Spencer Herring is a 59 y.o. male is here for an colonoscopy.   Past Medical History  Diagnosis Date  . COPD (chronic obstructive pulmonary disease)   . Anemia   . Neuromuscular disorder   . Sleep apnea   . Depression   . Asthma   . Morbid obesity     Past Surgical History  Procedure Laterality Date  . Bariatric surgery N/A     Prior to Admission medications   Medication Sig Start Date End Date Taking? Authorizing Provider  albuterol (ACCUNEB) 0.63 MG/3ML nebulizer solution Take 1 ampule by nebulization every 4 (four) hours as needed for wheezing.   Yes Historical Provider, MD  ALPRAZolam Duanne Moron) 0.25 MG tablet Take 0.25 mg by mouth at bedtime as needed for anxiety.   Yes Historical Provider, MD  ascorbic acid (VITAMIN C) 250 MG tablet Take 250 mg by mouth daily.   Yes Historical Provider, MD  budesonide-formoterol (SYMBICORT) 160-4.5 MCG/ACT inhaler Inhale 2 puffs into the lungs 2 (two) times daily.   Yes Historical Provider, MD  cholecalciferol (VITAMIN D) 1000 UNITS tablet Take 1,000 Units by mouth daily.   Yes Historical Provider, MD  doxycycline (VIBRAMYCIN) 100 MG capsule Take 100 mg by mouth 2 (two) times daily.   Yes Historical Provider, MD  escitalopram (LEXAPRO) 10 MG tablet Take 10 mg by mouth daily.   Yes Historical Provider, MD  etodolac (LODINE) 300 MG capsule Take 300 mg by mouth 3 (three) times daily as needed.   Yes Historical Provider, MD  ferrous sulfate 325 (65 FE) MG tablet Take 325 mg by mouth daily with breakfast.   Yes Historical Provider, MD  Multiple Vitamin (MULTIVITAMIN) tablet Take 1 tablet by mouth daily.   Yes Historical Provider, MD  oxyCODONE-acetaminophen (PERCOCET) 7.5-325 MG per tablet Take 1 tablet by mouth 2 (two) times daily as needed for severe pain.   Yes Historical Provider, MD  tamsulosin (FLOMAX) 0.4 MG CAPS capsule Take 0.4 mg by mouth daily.   Yes  Historical Provider, MD    Allergies as of 11/27/2014  . (Not on File)    History reviewed. No pertinent family history.  History   Social History  . Marital Status: Single    Spouse Name: N/A  . Number of Children: N/A  . Years of Education: N/A   Occupational History  . Not on file.   Social History Main Topics  . Smoking status: Not on file  . Smokeless tobacco: Not on file  . Alcohol Use: Not on file  . Drug Use: Not on file  . Sexual Activity: Not on file   Other Topics Concern  . Not on file   Social History Narrative     Physical Exam: BP 123/82 mmHg  Pulse 50  Temp(Src) 97.7 F (36.5 C) (Tympanic)  Resp 16  Ht 5\' 7"  (1.702 m)  Wt 87.998 kg (194 lb)  BMI 30.38 kg/m2  SpO2 99% General:   Alert,  pleasant and cooperative in NAD Head:  Normocephalic and atraumatic. Neck:  Supple; no masses or thyromegaly. Lungs:  Clear throughout to auscultation.    Heart:  Regular rate and rhythm. Abdomen:  Soft, nontender and nondistended. Normal bowel sounds, without guarding, and without rebound.   Neurologic:  Alert and  oriented x4;  grossly normal neurologically.  Impression/Plan: Spencer Herring is here for an colonoscopy to be performed for screening  Risks, benefits, limitations,  and alternatives regarding  colonoscopy have been reviewed with the patient.  Questions have been answered.  All parties agreeable.   Josefine Class, MD  12/01/2014, 11:55 AM

## 2014-12-01 NOTE — Discharge Instructions (Signed)

## 2014-12-01 NOTE — Transfer of Care (Signed)
Immediate Anesthesia Transfer of Care Note  Patient: Spencer Herring  Procedure(s) Performed: Procedure(s): COLONOSCOPY WITH PROPOFOL (N/A)  Patient Location: PACU and Endoscopy Unit  Anesthesia Type:General  Level of Consciousness: awake  Airway & Oxygen Therapy: Patient Spontanous Breathing and Patient connected to nasal cannula oxygen  Post-op Assessment: Report given to RN and Post -op Vital signs reviewed and stable  Post vital signs: Reviewed and stable  Last Vitals:  Filed Vitals:   12/01/14 1039  BP: 123/82  Pulse: 50  Temp: 36.5 C  Resp: 16    Complications: No apparent anesthesia complications

## 2014-12-02 ENCOUNTER — Encounter: Payer: Self-pay | Admitting: Gastroenterology

## 2014-12-05 LAB — SURGICAL PATHOLOGY

## 2015-01-23 DIAGNOSIS — D649 Anemia, unspecified: Secondary | ICD-10-CM | POA: Insufficient documentation

## 2015-04-02 ENCOUNTER — Ambulatory Visit: Payer: No Typology Code available for payment source | Admitting: Certified Registered"

## 2015-04-02 ENCOUNTER — Ambulatory Visit
Admission: RE | Admit: 2015-04-02 | Discharge: 2015-04-02 | Disposition: A | Discharge status: Home / Self Care | Payer: No Typology Code available for payment source | Source: Ambulatory Visit | Attending: Gastroenterology | Admitting: Gastroenterology

## 2015-04-02 ENCOUNTER — Encounter
Admission: RE | Disposition: A | Discharge status: Home / Self Care | Payer: Self-pay | Source: Ambulatory Visit | Attending: Gastroenterology

## 2015-04-02 ENCOUNTER — Encounter: Payer: Self-pay | Admitting: *Deleted

## 2015-04-02 DIAGNOSIS — K5730 Diverticulosis of large intestine without perforation or abscess without bleeding: Secondary | ICD-10-CM | POA: Insufficient documentation

## 2015-04-02 DIAGNOSIS — Z8711 Personal history of peptic ulcer disease: Secondary | ICD-10-CM | POA: Insufficient documentation

## 2015-04-02 DIAGNOSIS — G4733 Obstructive sleep apnea (adult) (pediatric): Secondary | ICD-10-CM | POA: Diagnosis not present

## 2015-04-02 DIAGNOSIS — G709 Myoneural disorder, unspecified: Secondary | ICD-10-CM | POA: Insufficient documentation

## 2015-04-02 DIAGNOSIS — Z79899 Other long term (current) drug therapy: Secondary | ICD-10-CM | POA: Diagnosis not present

## 2015-04-02 DIAGNOSIS — Z9884 Bariatric surgery status: Secondary | ICD-10-CM | POA: Diagnosis not present

## 2015-04-02 DIAGNOSIS — J45909 Unspecified asthma, uncomplicated: Secondary | ICD-10-CM | POA: Insufficient documentation

## 2015-04-02 DIAGNOSIS — F329 Major depressive disorder, single episode, unspecified: Secondary | ICD-10-CM | POA: Insufficient documentation

## 2015-04-02 DIAGNOSIS — Z79891 Long term (current) use of opiate analgesic: Secondary | ICD-10-CM | POA: Insufficient documentation

## 2015-04-02 DIAGNOSIS — Z6831 Body mass index (BMI) 31.0-31.9, adult: Secondary | ICD-10-CM | POA: Diagnosis not present

## 2015-04-02 DIAGNOSIS — Z800 Family history of malignant neoplasm of digestive organs: Secondary | ICD-10-CM | POA: Diagnosis not present

## 2015-04-02 DIAGNOSIS — J449 Chronic obstructive pulmonary disease, unspecified: Secondary | ICD-10-CM | POA: Insufficient documentation

## 2015-04-02 DIAGNOSIS — Z87891 Personal history of nicotine dependence: Secondary | ICD-10-CM | POA: Diagnosis not present

## 2015-04-02 HISTORY — PX: COLONOSCOPY: SHX5424

## 2015-04-02 SURGERY — COLONOSCOPY
Anesthesia: General

## 2015-04-02 MED ORDER — MIDAZOLAM HCL 5 MG/5ML IJ SOLN
INTRAMUSCULAR | Status: DC | PRN
  Administered 2015-04-02: 3 mg via INTRAVENOUS

## 2015-04-02 MED ORDER — FENTANYL CITRATE (PF) 100 MCG/2ML IJ SOLN
INTRAMUSCULAR | Status: DC | PRN
  Administered 2015-04-02: 25 ug via INTRAVENOUS

## 2015-04-02 MED ORDER — FENTANYL CITRATE (PF) 100 MCG/2ML IJ SOLN
INTRAMUSCULAR | Status: DC | PRN
  Administered 2015-04-02: 75 ug via INTRAVENOUS

## 2015-04-02 MED ORDER — FENTANYL CITRATE (PF) 100 MCG/2ML IJ SOLN
INTRAMUSCULAR | Status: AC
  Filled 2015-04-02: qty 4

## 2015-04-02 MED ORDER — MIDAZOLAM HCL 5 MG/5ML IJ SOLN
INTRAMUSCULAR | Status: DC | PRN
  Administered 2015-04-02: 2 mg via INTRAVENOUS

## 2015-04-02 MED ORDER — FENTANYL CITRATE (PF) 100 MCG/2ML IJ SOLN
INTRAMUSCULAR | Status: DC | PRN
  Administered 2015-04-02: 50 ug via INTRAVENOUS

## 2015-04-02 MED ORDER — MIDAZOLAM HCL 5 MG/5ML IJ SOLN
INTRAMUSCULAR | Status: AC
  Filled 2015-04-02: qty 10

## 2015-04-02 MED ORDER — SODIUM CHLORIDE 0.9 % IV SOLN
INTRAVENOUS | Status: DC
  Administered 2015-04-02: 11:00:00 via INTRAVENOUS

## 2015-04-02 NOTE — H&P (Signed)
Primary Care Physician:  Tracie Harrier, MD  Pre-Procedure History & Physical: HPI:  Spencer Herring is a 60 y.o. male is here for an colonoscopy.   Past Medical History  Diagnosis Date  . COPD (chronic obstructive pulmonary disease) ([place])   . Anemia   . Neuromuscular disorder ([place])   . Sleep apnea   . Depression   . Asthma   . Morbid obesity [facility])     Past Surgical History  Procedure Laterality Date  . Bariatric surgery N/A   . Colonoscopy with propofol N/A 12/01/2014    Procedure: COLONOSCOPY WITH PROPOFOL;  Surgeon: Josefine Class, MD;  Location: [facility] ENDOSCOPY;  Service: Endoscopy;  Laterality: N/A;    Prior to Admission medications   Medication Sig Start Date End Date Taking? Authorizing Provider  albuterol (ACCUNEB) 0.63 MG/3ML nebulizer solution Take 1 ampule by nebulization every 4 (four) hours as needed for wheezing.   Yes Historical Provider, MD  ALPRAZolam Duanne Moron) 0.25 MG tablet Take 0.25 mg by mouth at bedtime as needed for anxiety.   Yes Historical Provider, MD  ascorbic acid (VITAMIN C) 250 MG tablet Take 250 mg by mouth daily.   Yes Historical Provider, MD  budesonide-formoterol (SYMBICORT) 160-4.5 MCG/ACT inhaler Inhale 2 puffs into the lungs 2 (two) times daily.   Yes Historical Provider, MD  cholecalciferol (VITAMIN D) 1000 UNITS tablet Take 1,000 Units by mouth daily.   Yes Historical Provider, MD  escitalopram (LEXAPRO) 10 MG tablet Take 10 mg by mouth daily.   Yes Historical Provider, MD  ferrous sulfate 325 (65 FE) MG tablet Take 325 mg by mouth daily with breakfast.   Yes Historical Provider, MD  Multiple Vitamin (MULTIVITAMIN) tablet Take 1 tablet by mouth daily.   Yes Historical Provider, MD  oxyCODONE-acetaminophen (PERCOCET) 7.5-325 MG per tablet Take 1 tablet by mouth 2 (two) times daily as needed for severe pain.   Yes Historical Provider, MD  tamsulosin (FLOMAX) 0.4 MG CAPS capsule Take 0.4 mg by mouth daily.   Yes Historical Provider, MD   doxycycline (VIBRAMYCIN) 100 MG capsule Take 100 mg by mouth 2 (two) times daily.    Historical Provider, MD  etodolac (LODINE) 300 MG capsule Take 300 mg by mouth 3 (three) times daily as needed.    Historical Provider, MD    Allergies as of 03/26/2015  . (Not on File)    History reviewed. No pertinent family history.  Social History   Social History  . Marital Status: Single    Spouse Name: N/A  . Number of Children: N/A  . Years of Education: N/A   Occupational History  . Not on file.   Social History Main Topics  . Smoking status: Former Smoker    Types: Cigarettes    Quit date: 04/29/1999  . Smokeless tobacco: Never Used  . Alcohol Use: No  . Drug Use: No  . Sexual Activity: Not on file   Other Topics Concern  . Not on file   Social History Narrative     Physical Exam: BP 132/79 mmHg  Pulse 57  Temp(Src) 98.2 F (36.8 C) (Tympanic)  Resp 12  Ht 5\' 7"  (1.702 m)  Wt 90.266 kg (199 lb)  BMI 31.16 kg/m2  SpO2 99% General:   Alert,  pleasant and cooperative in NAD Head:  Normocephalic and atraumatic. Neck:  Supple; no masses or thyromegaly. Lungs:  Clear throughout to auscultation.    Heart:  Regular rate and rhythm. Abdomen:  Soft, nontender and nondistended. Normal  bowel sounds, without guarding, and without rebound.   Neurologic:  Alert and  oriented x4;  grossly normal neurologically.  Impression/Plan: Spencer Herring is here for an colonoscopy to be performed for f/u hepatic flexure ulcer  Risks, benefits, limitations, and alternatives regarding  colonoscopy have been reviewed with the patient.  Questions have been answered.  All parties agreeable.   Josefine Class, MD  04/02/2015, 10:57 AM

## 2015-04-02 NOTE — Discharge Instructions (Signed)

## 2015-04-02 NOTE — Op Note (Signed)
[facility] Gastroenterology Patient Name: Spencer Herring Procedure Date: 04/02/2015 11:07 AM MRN: [id number] Account #: [id number] Date of Birth: 06-04-1955 Admit Type: Outpatient Age: 59 Room: [facility] ENDO ROOM 3 Gender: Male Note Status: Finalized Procedure:         Colonoscopy Indications:       Family history of colon cancer in a first-degree relative,                     , Personal history of digestive disease ( Follow up of                     colonic ulcer, r/o malignancy), Last colon 11/2014 Patient Profile:   This is a 59 year old male. Providers:         Gerrit Heck. Rayann Heman, MD Referring MD:      Tracie Harrier, MD (Referring MD) Medicines:         Midazolam 4 mg IV, Fentanyl 150 micrograms IV Complications:     No immediate complications. Procedure:         Pre-Anesthesia Assessment:                    - Prior to the procedure, a History and Physical was                     performed, and patient medications, allergies and                     sensitivities were reviewed. The patient's tolerance of                     previous anesthesia was reviewed.                    After obtaining informed consent, the colonoscope was                     passed under direct vision. Throughout the procedure, the                     patient's blood pressure, pulse, and oxygen saturations                     were monitored continuously. The Colonoscope was                     introduced through the anus and advanced to the the                     terminal ileum. The colonoscopy was performed without                     difficulty. The patient tolerated the procedure well. The                     quality of the bowel preparation was excellent. Findings:      The perianal and digital rectal examinations were normal.      A few small and large-mouthed diverticula were found in the sigmoid       colon.      A tattoo was seen at the hepatic flexure. The tattoo site appeared      normal.      The exam was otherwise without abnormality on direct and retroflexion       views.      -  Ulcers no longer present. Impression:        - Diverticulosis in the sigmoid colon.                    - A tattoo was seen at the hepatic flexure. The tattoo                     site appeared normal.                    - The examination was otherwise normal on direct and                     retroflexion views.                    - No specimens collected.                    - Ulcers no longer present Recommendation:    - Observe patient in GI recovery unit.                    - High fiber diet.                    - Continue present medications.                    - Repeat colonoscopy in 5 years for screening purposes.                    - Return to referring physician.                    - The findings and recommendations were discussed with the                     patient.                    - The findings and recommendations were discussed with the                     patient's family. Procedure Code(s): --- Professional ---                    [phone], Colonoscopy, flexible; diagnostic, including                     collection of specimen(s) by brushing or washing, when                     performed (separate procedure) Diagnosis Code(s): --- Professional ---                    Z87.19, Personal history of other diseases of the                     digestive system                    K57.30, Diverticulosis of large intestine without                     perforation or abscess without bleeding CPT copyright 2014 American Medical Association. All rights reserved. The codes documented in this report are preliminary and upon coder review may  be revised to meet current compliance requirements. Mellody Life, MD 04/02/2015 11:42:24 AM This report has  been signed electronically. Number of Addenda: 0 Note Initiated On: 04/02/2015 11:07 AM Scope Withdrawal Time: 0 hours 11 minutes 36  seconds  Total Procedure Duration: 0 hours 17 minutes 15 seconds       [facility]

## 2015-08-31 DIAGNOSIS — M722 Plantar fascial fibromatosis: Secondary | ICD-10-CM | POA: Insufficient documentation

## 2015-08-31 DIAGNOSIS — J432 Centrilobular emphysema: Secondary | ICD-10-CM | POA: Insufficient documentation

## 2016-06-20 ENCOUNTER — Other Ambulatory Visit: Payer: Self-pay | Admitting: Internal Medicine

## 2016-06-20 DIAGNOSIS — M5440 Lumbago with sciatica, unspecified side: Principal | ICD-10-CM

## 2016-06-20 DIAGNOSIS — G8929 Other chronic pain: Secondary | ICD-10-CM

## 2016-08-28 ENCOUNTER — Other Ambulatory Visit: Payer: Self-pay | Admitting: Orthopedic Surgery

## 2017-11-02 DIAGNOSIS — K420 Umbilical hernia with obstruction, without gangrene: Secondary | ICD-10-CM | POA: Insufficient documentation

## 2017-11-03 DIAGNOSIS — E782 Mixed hyperlipidemia: Secondary | ICD-10-CM | POA: Insufficient documentation

## 2017-11-03 DIAGNOSIS — I2089 Other forms of angina pectoris: Secondary | ICD-10-CM | POA: Insufficient documentation

## 2017-11-24 DIAGNOSIS — I208 Other forms of angina pectoris: Secondary | ICD-10-CM | POA: Diagnosis present

## 2017-12-01 ENCOUNTER — Encounter: Payer: Self-pay | Admitting: *Deleted

## 2017-12-01 ENCOUNTER — Encounter
Admission: RE | Disposition: A | Discharge status: Home / Self Care | Payer: Self-pay | Source: Ambulatory Visit | Attending: Cardiology

## 2017-12-01 ENCOUNTER — Observation Stay
Admission: RE | Admit: 2017-12-01 | Discharge: 2017-12-02 | Disposition: A | Discharge status: Home / Self Care | Payer: BLUE CROSS/BLUE SHIELD | Source: Ambulatory Visit | Attending: Cardiology | Admitting: Cardiology

## 2017-12-01 ENCOUNTER — Other Ambulatory Visit: Payer: Self-pay

## 2017-12-01 DIAGNOSIS — I4510 Unspecified right bundle-branch block: Secondary | ICD-10-CM | POA: Diagnosis not present

## 2017-12-01 DIAGNOSIS — D649 Anemia, unspecified: Secondary | ICD-10-CM | POA: Insufficient documentation

## 2017-12-01 DIAGNOSIS — J449 Chronic obstructive pulmonary disease, unspecified: Secondary | ICD-10-CM | POA: Diagnosis not present

## 2017-12-01 DIAGNOSIS — Z6834 Body mass index (BMI) 34.0-34.9, adult: Secondary | ICD-10-CM | POA: Insufficient documentation

## 2017-12-01 DIAGNOSIS — I25119 Atherosclerotic heart disease of native coronary artery with unspecified angina pectoris: Secondary | ICD-10-CM | POA: Diagnosis not present

## 2017-12-01 DIAGNOSIS — Z9889 Other specified postprocedural states: Secondary | ICD-10-CM | POA: Diagnosis not present

## 2017-12-01 DIAGNOSIS — Z91040 Latex allergy status: Secondary | ICD-10-CM | POA: Diagnosis not present

## 2017-12-01 DIAGNOSIS — G4733 Obstructive sleep apnea (adult) (pediatric): Secondary | ICD-10-CM | POA: Diagnosis not present

## 2017-12-01 DIAGNOSIS — Z7982 Long term (current) use of aspirin: Secondary | ICD-10-CM | POA: Insufficient documentation

## 2017-12-01 DIAGNOSIS — Z87891 Personal history of nicotine dependence: Secondary | ICD-10-CM | POA: Diagnosis not present

## 2017-12-01 DIAGNOSIS — E782 Mixed hyperlipidemia: Secondary | ICD-10-CM | POA: Insufficient documentation

## 2017-12-01 DIAGNOSIS — F329 Major depressive disorder, single episode, unspecified: Secondary | ICD-10-CM | POA: Insufficient documentation

## 2017-12-01 DIAGNOSIS — Z8249 Family history of ischemic heart disease and other diseases of the circulatory system: Secondary | ICD-10-CM | POA: Diagnosis not present

## 2017-12-01 DIAGNOSIS — Z7951 Long term (current) use of inhaled steroids: Secondary | ICD-10-CM | POA: Diagnosis not present

## 2017-12-01 DIAGNOSIS — I208 Other forms of angina pectoris: Secondary | ICD-10-CM | POA: Diagnosis present

## 2017-12-01 DIAGNOSIS — Z79899 Other long term (current) drug therapy: Secondary | ICD-10-CM | POA: Insufficient documentation

## 2017-12-01 DIAGNOSIS — I2510 Atherosclerotic heart disease of native coronary artery without angina pectoris: Secondary | ICD-10-CM | POA: Diagnosis present

## 2017-12-01 DIAGNOSIS — R9430 Abnormal result of cardiovascular function study, unspecified: Secondary | ICD-10-CM | POA: Insufficient documentation

## 2017-12-01 DIAGNOSIS — R079 Chest pain, unspecified: Secondary | ICD-10-CM

## 2017-12-01 DIAGNOSIS — I2089 Other forms of angina pectoris: Secondary | ICD-10-CM | POA: Diagnosis present

## 2017-12-01 HISTORY — PX: CORONARY STENT INTERVENTION: CATH118234

## 2017-12-01 HISTORY — PX: LEFT HEART CATH AND CORONARY ANGIOGRAPHY: CATH118249

## 2017-12-01 LAB — POCT ACTIVATED CLOTTING TIME: ACTIVATED CLOTTING TIME: 318 s

## 2017-12-01 SURGERY — LEFT HEART CATH AND CORONARY ANGIOGRAPHY
Anesthesia: Moderate Sedation

## 2017-12-01 MED ORDER — ASPIRIN 81 MG PO CHEW: 81.0000 mg | CHEWABLE_TABLET | ORAL | Status: DC

## 2017-12-01 MED ORDER — LIDOCAINE HCL (PF) 1 % IJ SOLN
INTRAMUSCULAR | Status: AC
Start: 2017-12-01 — End: ?
  Filled 2017-12-01: qty 30

## 2017-12-01 MED ORDER — ONDANSETRON HCL 4 MG/2ML IJ SOLN
4.0000 mg | Freq: Four times a day (QID) | INTRAMUSCULAR | Status: DC | PRN
Start: 2017-12-01 — End: 2017-12-02

## 2017-12-01 MED ORDER — CLOPIDOGREL BISULFATE 75 MG PO TABS: 75.0000 mg | ORAL_TABLET | Freq: Every day | ORAL | 4 refills | Status: DC

## 2017-12-01 MED ORDER — FAMOTIDINE 20 MG PO TABS
ORAL_TABLET | ORAL | Status: AC
  Filled 2017-12-01: qty 1

## 2017-12-01 MED ORDER — SODIUM CHLORIDE 0.9% FLUSH: 3.0000 mL | INTRAVENOUS | Status: DC | PRN

## 2017-12-01 MED ORDER — ATORVASTATIN CALCIUM 40 MG PO TABS: 40.0000 mg | ORAL_TABLET | Freq: Every day | ORAL | 4 refills | Status: DC

## 2017-12-01 MED ORDER — MORPHINE SULFATE (PF) 2 MG/ML IV SOLN
2.0000 mg | Freq: Four times a day (QID) | INTRAVENOUS | Status: DC | PRN
  Administered 2017-12-01 – 2017-12-02 (×3): 2 mg via INTRAVENOUS
  Filled 2017-12-01 (×3): qty 1

## 2017-12-01 MED ORDER — MIDAZOLAM HCL 2 MG/2ML IJ SOLN
INTRAMUSCULAR | Status: AC
  Filled 2017-12-01: qty 2

## 2017-12-01 MED ORDER — HYDRALAZINE HCL 20 MG/ML IJ SOLN: 5.0000 mg | INTRAMUSCULAR | Status: AC | PRN

## 2017-12-01 MED ORDER — FENTANYL CITRATE (PF) 100 MCG/2ML IJ SOLN
INTRAMUSCULAR | Status: AC
  Filled 2017-12-01: qty 2

## 2017-12-01 MED ORDER — TAMSULOSIN HCL 0.4 MG PO CAPS
0.4000 mg | ORAL_CAPSULE | Freq: Every day | ORAL | Status: DC
  Administered 2017-12-01 – 2017-12-02 (×2): 0.4 mg via ORAL
  Filled 2017-12-01 (×2): qty 1

## 2017-12-01 MED ORDER — ACETAMINOPHEN 325 MG PO TABS: 650.0000 mg | ORAL_TABLET | ORAL | Status: DC | PRN

## 2017-12-01 MED ORDER — OXYCODONE-ACETAMINOPHEN 7.5-325 MG PO TABS
1.0000 | ORAL_TABLET | Freq: Three times a day (TID) | ORAL | Status: DC | PRN
  Administered 2017-12-01 – 2017-12-02 (×3): 1 via ORAL
  Filled 2017-12-01 (×4): qty 1

## 2017-12-01 MED ORDER — FAMOTIDINE 10 MG PO TABS
ORAL_TABLET | ORAL | Status: DC | PRN
  Administered 2017-12-01: 20 mg via ORAL

## 2017-12-01 MED ORDER — SODIUM CHLORIDE 0.9% FLUSH: 3.0000 mL | Freq: Two times a day (BID) | INTRAVENOUS | Status: DC

## 2017-12-01 MED ORDER — FENTANYL CITRATE (PF) 100 MCG/2ML IJ SOLN
INTRAMUSCULAR | Status: DC | PRN
  Administered 2017-12-01: 50 ug via INTRAVENOUS
  Administered 2017-12-01 (×2): 25 ug via INTRAVENOUS
  Administered 2017-12-01: 50 ug via INTRAVENOUS

## 2017-12-01 MED ORDER — OXYCODONE-ACETAMINOPHEN 7.5-325 MG PO TABS: 1.0000 | ORAL_TABLET | Freq: Once | ORAL | Status: DC

## 2017-12-01 MED ORDER — SODIUM CHLORIDE 0.9 % IV SOLN
0.2500 mg/kg/h | INTRAVENOUS | Status: AC
  Filled 2017-12-01: qty 250

## 2017-12-01 MED ORDER — SODIUM CHLORIDE 0.9 % IV SOLN: 250.0000 mL | INTRAVENOUS | Status: DC | PRN

## 2017-12-01 MED ORDER — IOPAMIDOL (ISOVUE-300) INJECTION 61%
INTRAVENOUS | Status: DC | PRN
  Administered 2017-12-01: 135 mL via INTRA_ARTERIAL
  Administered 2017-12-01: 230 mL via INTRA_ARTERIAL

## 2017-12-01 MED ORDER — AMLODIPINE BESYLATE 2.5 MG PO TABS: 2.5000 mg | ORAL_TABLET | Freq: Every day | ORAL | 4 refills | Status: DC

## 2017-12-01 MED ORDER — ASPIRIN 81 MG PO CHEW: 81.0000 mg | CHEWABLE_TABLET | Freq: Every day | ORAL | 4 refills | Status: DC

## 2017-12-01 MED ORDER — SODIUM CHLORIDE 0.9% FLUSH
3.0000 mL | Freq: Two times a day (BID) | INTRAVENOUS | Status: DC
  Administered 2017-12-01 – 2017-12-02 (×2): 3 mL via INTRAVENOUS

## 2017-12-01 MED ORDER — BIVALIRUDIN TRIFLUOROACETATE 250 MG IV SOLR
INTRAVENOUS | Status: AC
  Filled 2017-12-01: qty 250

## 2017-12-01 MED ORDER — TRAMADOL HCL 50 MG PO TABS
ORAL_TABLET | ORAL | Status: AC
  Filled 2017-12-01: qty 1

## 2017-12-01 MED ORDER — BIVALIRUDIN BOLUS VIA INFUSION - CUPID
INTRAVENOUS | Status: DC | PRN
  Administered 2017-12-01: 67.725 mg via INTRAVENOUS

## 2017-12-01 MED ORDER — NITROGLYCERIN 5 MG/ML IV SOLN
INTRAVENOUS | Status: AC
  Filled 2017-12-01: qty 10

## 2017-12-01 MED ORDER — CLOPIDOGREL BISULFATE 75 MG PO TABS
ORAL_TABLET | ORAL | Status: DC | PRN
  Administered 2017-12-01: 600 mg via ORAL

## 2017-12-01 MED ORDER — LIDOCAINE HCL (PF) 1 % IJ SOLN
INTRAMUSCULAR | Status: AC
  Filled 2017-12-01: qty 30

## 2017-12-01 MED ORDER — SODIUM CHLORIDE 0.9 % WEIGHT BASED INFUSION: 1.0000 mL/kg/h | INTRAVENOUS | Status: DC

## 2017-12-01 MED ORDER — SODIUM CHLORIDE 0.9 % WEIGHT BASED INFUSION
3.0000 mL/kg/h | INTRAVENOUS | Status: AC
  Administered 2017-12-01: 3 mL/kg/h via INTRAVENOUS

## 2017-12-01 MED ORDER — HEPARIN (PORCINE) IN NACL 1000-0.9 UT/500ML-% IV SOLN
INTRAVENOUS | Status: AC
  Filled 2017-12-01: qty 1000

## 2017-12-01 MED ORDER — ATORVASTATIN CALCIUM 20 MG PO TABS
80.0000 mg | ORAL_TABLET | Freq: Every day | ORAL | Status: DC
  Administered 2017-12-01: 80 mg via ORAL
  Filled 2017-12-01: qty 4

## 2017-12-01 MED ORDER — GABAPENTIN 300 MG PO CAPS
300.0000 mg | ORAL_CAPSULE | Freq: Every day | ORAL | Status: DC
  Administered 2017-12-01: 300 mg via ORAL
  Filled 2017-12-01: qty 1

## 2017-12-01 MED ORDER — BUDESONIDE 0.5 MG/2ML IN SUSP
0.5000 mg | Freq: Two times a day (BID) | RESPIRATORY_TRACT | Status: DC
  Administered 2017-12-01 – 2017-12-02 (×2): 0.5 mg via RESPIRATORY_TRACT
  Filled 2017-12-01 (×2): qty 2

## 2017-12-01 MED ORDER — ASPIRIN 81 MG PO CHEW
CHEWABLE_TABLET | ORAL | Status: AC
  Filled 2017-12-01: qty 4

## 2017-12-01 MED ORDER — SODIUM CHLORIDE 0.9 % IV SOLN
INTRAVENOUS | Status: AC | PRN
  Administered 2017-12-01 (×2): 1.75 mg/kg/h via INTRAVENOUS

## 2017-12-01 MED ORDER — LABETALOL HCL 5 MG/ML IV SOLN: 10.0000 mg | INTRAVENOUS | Status: AC | PRN

## 2017-12-01 MED ORDER — CLOPIDOGREL BISULFATE 75 MG PO TABS
75.0000 mg | ORAL_TABLET | Freq: Every day | ORAL | Status: DC
  Administered 2017-12-02: 75 mg via ORAL
  Filled 2017-12-01: qty 1

## 2017-12-01 MED ORDER — ASPIRIN 81 MG PO CHEW
81.0000 mg | CHEWABLE_TABLET | Freq: Every day | ORAL | Status: DC
  Administered 2017-12-02: 81 mg via ORAL
  Filled 2017-12-01: qty 1

## 2017-12-01 MED ORDER — MIDAZOLAM HCL 2 MG/2ML IJ SOLN
INTRAMUSCULAR | Status: DC | PRN
  Administered 2017-12-01 (×3): 1 mg via INTRAVENOUS

## 2017-12-01 MED ORDER — SODIUM CHLORIDE 0.9 % WEIGHT BASED INFUSION
1.0000 mL/kg/h | INTRAVENOUS | Status: AC
  Administered 2017-12-01: 1 mL/kg/h via INTRAVENOUS

## 2017-12-01 MED ORDER — ASPIRIN 81 MG PO CHEW
CHEWABLE_TABLET | ORAL | Status: DC | PRN
  Administered 2017-12-01: 324 mg via ORAL

## 2017-12-01 MED ORDER — TRAMADOL HCL 50 MG PO TABS
50.0000 mg | ORAL_TABLET | Freq: Two times a day (BID) | ORAL | Status: DC | PRN
  Administered 2017-12-01: 50 mg via ORAL

## 2017-12-01 MED ORDER — CLOPIDOGREL BISULFATE 75 MG PO TABS
ORAL_TABLET | ORAL | Status: AC
Start: 2017-12-01 — End: ?
  Filled 2017-12-01: qty 8

## 2017-12-01 SURGICAL SUPPLY — 23 items
BALLN TREK RX 2.5X12 (BALLOONS) ×4
BALLN ~~LOC~~ TREK RX 3.0X15 (BALLOONS) ×4
BALLOON TREK RX 2.5X12 (BALLOONS) ×2 IMPLANT
BALLOON ~~LOC~~ TREK RX 3.0X15 (BALLOONS) ×2 IMPLANT
CATH INFINITI 5FR ANG PIGTAIL (CATHETERS) ×4 IMPLANT
CATH INFINITI 5FR JL4 (CATHETERS) ×4 IMPLANT
CATH INFINITI JR4 5F (CATHETERS) ×4 IMPLANT
CATH LAUNCHER 6FR EBU3.5 (CATHETERS) ×4 IMPLANT
CATH VISTA GUIDE 6FR JL3.5 (CATHETERS) ×4 IMPLANT
CATH VISTA GUIDE 6FR JL4 (CATHETERS) ×4 IMPLANT
CATH VISTA GUIDE 6FR XB3 (CATHETERS) ×4 IMPLANT
CATH VISTA GUIDE 6FR XB3.5 (CATHETERS) ×4 IMPLANT
CATH VISTA GUIDE 6FR XBLAD3.5 (CATHETERS) ×4 IMPLANT
DEVICE CLOSURE MYNXGRIP 6/7F (Vascular Products) ×4 IMPLANT
DEVICE INFLAT 30 PLUS (MISCELLANEOUS) ×4 IMPLANT
KIT MANI 3VAL PERCEP (MISCELLANEOUS) ×4 IMPLANT
NEEDLE PERC 18GX7CM (NEEDLE) ×4 IMPLANT
PACK CARDIAC CATH (CUSTOM PROCEDURE TRAY) ×4 IMPLANT
SHEATH AVANTI 5FR X 11CM (SHEATH) ×4 IMPLANT
SHEATH AVANTI 6FR X 11CM (SHEATH) ×4 IMPLANT
STENT SIERRA 2.75 X 15 MM (Permanent Stent) ×4 IMPLANT
WIRE ASAHI PROWATER 180CM (WIRE) ×4 IMPLANT
WIRE GUIDERIGHT .035X150 (WIRE) ×4 IMPLANT

## 2017-12-01 NOTE — Discharge Summary (Signed)
[facility] Cardiology Discharge Summary  Patient ID: JEDADIAH ABDALLAH MRN: [id number] DOB/AGE: 60/06/1955 60 y.o.  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  Hospital Course: The patient was admitted to specials for cardiac cath with selective coronary angiogram after full consent, risk and benefits explained, and time out called with all approprate details voiced and discussed. The patient has had progressive canadian class 3 angina with high probability risk stress test consistent with ischemic chest pain and or anginal equivalent with coronary artery risk factors including high blood pressure and high cholesterol. The procedure was performed without complication and it revealed normal left ventricular function with ejection fraction of 55%.  It was found that the patient had severe 1 vessel coronary atherosclerosis with significant left anterior descending stenosis requiring further intervention. Therefore, the patient had a PCI and drug eluding stent placed without complication. The patient has been ambulating without further significant symptoms and has reached his maximal hospital benefit and will be discharged to home in good condition.  Cardiac rehabilitation has been discussed and recommended. Medication management of cardiovascular risk factors will be given post discharge and modified as an outpatient.   Discharge Exam: Blood pressure (!) 149/100, pulse 67, temperature 98.3 F (36.8 C), temperature source Oral, resp. rate 18, height 5\' 7"  (1.702 m), weight 219 lb 1.6 oz (99.4 kg), SpO2 99 %.  Constitutional:  [place] oriented to person, place, and time. No distress.  HENT: No nasal discharge.  Head: Normocephalic and atraumatic.  Eyes: Pupils are equal and round. No discharge.  Neck: Normal range of motion. Neck supple. No JVD present. No thyromegaly present.  Cardiovascular: Normal rate, regular rhythm, normal S1 S2, no gallop, no friction rub. No murmur Pulmonary/Chest: Effort normal, No stridor. No respiratory distress. no wheezes.  no rales.    Abdominal: Soft. Bowel sounds are normal.  no distension.  no tenderness. There is no rebound and no guarding.  Musculoskeletal: No edema, no cyanosis, normal pulses, no bleeding, Normal range of motion. no tenderness.  Neurological:  alert and oriented to person, place, and time. Coordination normal.  Skin: Skin is warm and dry. No rash noted. No erythema. No pallor.  Psychiatric:  normal mood and affect. behavior is normal.    Labs:   Lab Results  Component Value Date   WBC 8.8 05/04/2014   HGB 12.6 (L) 05/04/2014   HCT 38.8 (L) 05/04/2014   MCV 86 05/04/2014   PLT 168 05/04/2014   No results for input(s): NA, K, CL, CO2, BUN, CREATININE, CALCIUM, PROT, BILITOT, ALKPHOS, ALT, AST, GLUCOSE in the last 168 hours.  Invalid input(s): LABALBU  EKG: NSR without evidence of new changes  FOLLOW UP IN ONE TO TWO WEEKS Discharge Instructions    AMB Referral to Cardiac Rehabilitation - Phase II   Complete by:  As directed    Diagnosis:  Coronary Stents     Allergies as of 12/01/2017      Reactions   Latex Rash      Medication List    TAKE these medications   albuterol 0.63 MG/3ML nebulizer solution Commonly known as:  ACCUNEB Take 1 ampule by nebulization every 4 (four) hours as needed for  wheezing.   ALPRAZolam 0.25 MG tablet Commonly known as:  XANAX Take 0.25 mg by mouth at bedtime as needed for anxiety.   amLODipine 2.5 MG tablet Commonly known as:  NORVASC Take 1 tablet (2.5 mg total) by mouth daily.   ascorbic acid 250 MG  tablet Commonly known as:  VITAMIN C Take 250 mg by mouth daily.   aspirin 81 MG chewable tablet Chew 1 tablet (81 mg total) by mouth daily. Start taking on:  12/02/2017   atorvastatin 40 MG tablet Commonly known as:  LIPITOR Take 1 tablet (40 mg total) by mouth daily.   budesonide-formoterol 160-4.5 MCG/ACT inhaler Commonly known as:  SYMBICORT Inhale 2 puffs into the lungs daily as needed (SHORTNESS OF BREATH).   clopidogrel 75 MG tablet Commonly known as:  PLAVIX Take 1 tablet (75 mg total) by mouth daily with breakfast. Start taking on:  12/02/2017   escitalopram 10 MG tablet Commonly known as:  LEXAPRO Take 10 mg by mouth daily.   ferrous sulfate 325 (65 FE) MG tablet Take 325 mg by mouth 2 (two) times daily with a meal.   gabapentin 300 MG capsule Commonly known as:  NEURONTIN Take 300 mg by mouth at bedtime.   isosorbide mononitrate 30 MG 24 hr tablet Commonly known as:  IMDUR Take 30 mg by mouth daily.   multivitamin tablet Take 1 tablet by mouth daily.   oxyCODONE-acetaminophen 7.5-325 MG tablet Commonly known as:  PERCOCET Take 1 tablet by mouth 2 (two) times daily as needed for severe pain.   traMADol 50 MG tablet Commonly known as:  ULTRAM Take 50 mg by mouth every 12 (twelve) hours as needed for moderate pain.      Follow-up Information    Corey Skains, MD Follow up in 1 week(s).   Specialty:  Cardiology Contact information: [address] [facility] [place] [address] [phone]           THE PATIENT  SHALL BRING ALL MEDICATIONS TO FOLLOW UP APPOINTMENT  Signed:  Corey Skains MD, [facility] 12/01/2017, 1:32 PM

## 2017-12-01 NOTE — Progress Notes (Signed)
Pt. Received from cath lab. Noted right groin hematoma (semi-soft) : aprox. 9 cm x 8 cm in groin area and 9 cm x 8 cm in upper thigh area. Both sites marked now. Very tender to touch in groin/thigh area. Pt. Unable to urinate and states "it's painful." Dr. To Dr. Nehemiah Massed- orders received for I & O catheter.

## 2017-12-01 NOTE — Progress Notes (Signed)
Patient in room, alert and oriented x4. Complaining of pain 10/10 in lower back.PRN medications given by specials nurse has no relieved any pain. MD paged to notify. Verbal orders from [facility] to give morphine IV 2mg  Q6hrs PRN. Will administer and continue to monitor patient.

## 2017-12-01 NOTE — Progress Notes (Signed)
Pt. Med. With tramadol 50 mg p.o. Now secondary to pt. States "my back pain is still staying at a '6'.(on scale 1-10.

## 2017-12-01 NOTE — Progress Notes (Signed)
700 ml drained out from in & out cath. Pt. Tolerated well. 12 lead EKG done without any acute changes noted. Pt. Taking sips of H2O. Pt. In no acute distress at present. Angiomax gtt. Remains running per MD order.

## 2017-12-01 NOTE — Plan of Care (Signed)
  Problem: Education: Goal: Knowledge of General Education information will improve Description Including pain rating scale, medication(s)/side effects and non-pharmacologic comfort measures Outcome: Progressing   Problem: Health Behavior/Discharge Planning: Goal: Ability to manage health-related needs will improve Outcome: Progressing   Problem: Clinical Measurements: Goal: Ability to maintain clinical measurements within normal limits will improve Outcome: Progressing Goal: Will remain free from infection Outcome: Progressing   Problem: Nutrition: Goal: Adequate nutrition will be maintained Outcome: Progressing   Problem: Safety: Goal: Ability to remain free from injury will improve Outcome: Progressing   Problem: Cardiovascular: Goal: Vascular access site(s) Level 0-1 will be maintained Outcome: Progressing

## 2017-12-01 NOTE — Progress Notes (Signed)
Right groin soft except 3 cm "knot" around RFA site. Inner groin and prox. Upper thigh "soft " to touch now. Pt. States "back still hurts, but it's a little better." Pt. Repositioned to right side with pillow support.

## 2017-12-02 DIAGNOSIS — I25119 Atherosclerotic heart disease of native coronary artery with unspecified angina pectoris: Secondary | ICD-10-CM | POA: Diagnosis not present

## 2017-12-02 DIAGNOSIS — Z955 Presence of coronary angioplasty implant and graft: Secondary | ICD-10-CM | POA: Diagnosis not present

## 2017-12-02 LAB — CBC
HCT: 37.6 % — ABNORMAL LOW (ref 40.0–52.0)
HEMOGLOBIN: 13 g/dL (ref 13.0–18.0)
MCH: 29.5 pg (ref 26.0–34.0)
MCHC: 34.5 g/dL (ref 32.0–36.0)
MCV: 85.5 fL (ref 80.0–100.0)
Platelets: 200 10*3/uL (ref 150–440)
RBC: 4.4 MIL/uL (ref 4.40–5.90)
RDW: 13.3 % (ref 11.5–14.5)
WBC: 8.8 10*3/uL (ref 3.8–10.6)

## 2017-12-02 LAB — BASIC METABOLIC PANEL
ANION GAP: 7 (ref 5–15)
BUN: 18 mg/dL (ref 6–20)
CHLORIDE: 104 mmol/L (ref 98–111)
CO2: 30 mmol/L (ref 22–32)
Calcium: 9.2 mg/dL (ref 8.9–10.3)
Creatinine, Ser: 0.74 mg/dL (ref 0.61–1.24)
GFR calc non Af Amer: >60 mL/min (ref >60)
Glucose, Bld: 99 mg/dL (ref 70–99)
Potassium: 4.6 mmol/L (ref 3.5–5.1)
Sodium: 141 mmol/L (ref 135–145)

## 2017-12-02 NOTE — Progress Notes (Signed)
Patient given discharge instructions. Both IV's taken out and tele monitor off. Patient verbalized understanding with no further questions. Stent card given to patient. Patient called brother to come pick him up. Patient will be going home via family vehicle in stable condition.

## 2017-12-02 NOTE — Plan of Care (Signed)
Right groin pain and chronic back pain relieved with prn meds.  Right groin soft and without and active bleed or hematoma.

## 2017-12-02 NOTE — Plan of Care (Signed)

## 2017-12-02 NOTE — Progress Notes (Signed)
Patient feeling well. Denies chest pain, shortness of breath or palpitations. Has minor right groin discomfort, but no evidence of drainage or hematoma. Okay to discharge per Dr. Alveria Apley note.

## 2017-12-10 ENCOUNTER — Other Ambulatory Visit: Payer: Self-pay | Admitting: Nurse Practitioner

## 2017-12-10 DIAGNOSIS — S8011XD Contusion of right lower leg, subsequent encounter: Secondary | ICD-10-CM

## 2017-12-10 DIAGNOSIS — I2510 Atherosclerotic heart disease of native coronary artery without angina pectoris: Secondary | ICD-10-CM | POA: Insufficient documentation

## 2017-12-11 ENCOUNTER — Other Ambulatory Visit: Payer: Self-pay

## 2017-12-11 ENCOUNTER — Ambulatory Visit
Admission: RE | Admit: 2017-12-11 | Discharge: 2017-12-11 | Disposition: A | Discharge status: Home / Self Care | Payer: BLUE CROSS/BLUE SHIELD | Source: Ambulatory Visit | Attending: Nurse Practitioner | Admitting: Nurse Practitioner

## 2017-12-11 ENCOUNTER — Emergency Department
Admission: EM | Admit: 2017-12-11 | Discharge: 2017-12-11 | Disposition: A | Discharge status: Home / Self Care | Payer: BLUE CROSS/BLUE SHIELD | Attending: Emergency Medicine | Admitting: Emergency Medicine

## 2017-12-11 ENCOUNTER — Encounter: Payer: Self-pay | Admitting: Emergency Medicine

## 2017-12-11 DIAGNOSIS — R1031 Right lower quadrant pain: Secondary | ICD-10-CM | POA: Diagnosis present

## 2017-12-11 DIAGNOSIS — S8011XD Contusion of right lower leg, subsequent encounter: Secondary | ICD-10-CM

## 2017-12-11 DIAGNOSIS — Z7902 Long term (current) use of antithrombotics/antiplatelets: Secondary | ICD-10-CM | POA: Insufficient documentation

## 2017-12-11 DIAGNOSIS — X58XXXD Exposure to other specified factors, subsequent encounter: Secondary | ICD-10-CM | POA: Insufficient documentation

## 2017-12-11 DIAGNOSIS — Z87891 Personal history of nicotine dependence: Secondary | ICD-10-CM | POA: Insufficient documentation

## 2017-12-11 DIAGNOSIS — Z79899 Other long term (current) drug therapy: Secondary | ICD-10-CM | POA: Diagnosis not present

## 2017-12-11 DIAGNOSIS — I2510 Atherosclerotic heart disease of native coronary artery without angina pectoris: Secondary | ICD-10-CM | POA: Insufficient documentation

## 2017-12-11 DIAGNOSIS — I729 Aneurysm of unspecified site: Secondary | ICD-10-CM

## 2017-12-11 DIAGNOSIS — J449 Chronic obstructive pulmonary disease, unspecified: Secondary | ICD-10-CM | POA: Insufficient documentation

## 2017-12-11 DIAGNOSIS — I724 Aneurysm of artery of lower extremity: Secondary | ICD-10-CM | POA: Insufficient documentation

## 2017-12-11 NOTE — ED Notes (Signed)
Mild swelling and redness noted to cath site in right groin area. Pt c/o mild pain. No drainage noted. Pt also c/o mild chest pain intermittently since yesterday. MD made aware.

## 2017-12-11 NOTE — ED Notes (Signed)
Pt states he has been having mild chest pain since yesterday. Offered EKG, pt refused. MD aware of this.

## 2017-12-11 NOTE — ED Triage Notes (Addendum)
Pt to ED via POV was seen yesterday for follow up from cardiac cath operation last week, pt states nurse recommended ED evaluation due to [place] results showing pseudoaneurysm   . VSS, NAD noted . Infection not noted at site

## 2017-12-11 NOTE — ED Provider Notes (Signed)
[facility] Emergency Department Provider Note   ____________________________________________    I have reviewed the triage vital signs and the nursing notes.   HISTORY  Chief Complaint No chief complaint on file.     HPI Spencer Herring is a 62 y.o. male with a history of COPD presents with reports of a mild discomfort in his right groin.  Patient reports he had catheterization 2 to 3 weeks ago and had significant bruising afterwards.  Apparently had an ultrasound done yesterday which demonstrated a 5 cm mostly thrombosed pseudoaneurysm.  He was sent to the emergency department although he was somewhat confused about this because he states that he is feeling better and denies significant pain.  He states that he has been able to work and that the bruising is improving.   Past Medical History:  Diagnosis Date  . Anemia   . Asthma   . COPD (chronic obstructive pulmonary disease) ([place])   . Morbid obesity ([place])   . Neuromuscular disorder ([place])   . Sleep apnea     Patient Active Problem List   Diagnosis Date Noted  . CAD (coronary artery disease) 12/01/2017  . Angina decubitus ([place]) 11/24/2017    Past Surgical History:  Procedure Laterality Date  . BARIATRIC SURGERY N/A   . COLONOSCOPY N/A 04/02/2015   Procedure: COLONOSCOPY;  Surgeon: Josefine Class, MD;  Location: [facility] ENDOSCOPY;  Service: Endoscopy;  Laterality: N/A;  . COLONOSCOPY WITH PROPOFOL N/A 12/01/2014   Procedure: COLONOSCOPY WITH PROPOFOL;  Surgeon: Josefine Class, MD;  Location: [facility] ENDOSCOPY;  Service: Endoscopy;  Laterality: N/A;  . CORONARY STENT INTERVENTION N/A 12/01/2017   Procedure: CORONARY STENT INTERVENTION;  Surgeon: Isaias Cowman, MD;  Location: [place] CV LAB;  Service: Cardiovascular;  Laterality: N/A;  . LEFT HEART CATH AND CORONARY ANGIOGRAPHY Left 12/01/2017   Procedure: LEFT HEART CATH AND CORONARY ANGIOGRAPHY;  Surgeon: Corey Skains, MD;   Location: [place] CV LAB;  Service: Cardiovascular;  Laterality: Left;  . PNEUMONECTOMY Left     Prior to Admission medications   Medication Sig Start Date End Date Taking? Authorizing Provider  albuterol (ACCUNEB) 0.63 MG/3ML nebulizer solution Take 1 ampule by nebulization every 4 (four) hours as needed for wheezing.    [provider]  ALPRAZolam Duanne Moron) 0.25 MG tablet Take 0.25 mg by mouth at bedtime as needed for anxiety.    [provider]  amLODipine (NORVASC) 2.5 MG tablet Take 1 tablet (2.5 mg total) by mouth daily. 12/01/17   Corey Skains, MD  ascorbic acid (VITAMIN C) 250 MG tablet Take 250 mg by mouth daily.    [provider]  aspirin 81 MG chewable tablet Chew 1 tablet (81 mg total) by mouth daily. 12/02/17   Corey Skains, MD  atorvastatin (LIPITOR) 40 MG tablet Take 1 tablet (40 mg total) by mouth daily. 12/01/17   Corey Skains, MD  budesonide-formoterol (SYMBICORT) 160-4.5 MCG/ACT inhaler Inhale 2 puffs into the lungs daily as needed (SHORTNESS OF BREATH).     [provider]  clopidogrel (PLAVIX) 75 MG tablet Take 1 tablet (75 mg total) by mouth daily with breakfast. 12/02/17   Corey Skains, MD  escitalopram (LEXAPRO) 10 MG tablet Take 10 mg by mouth daily.    [provider]  ferrous sulfate 325 (65 FE) MG tablet Take 325 mg by mouth 2 (two) times daily with a meal.     [provider]  gabapentin (  NEURONTIN) 300 MG capsule Take 300 mg by mouth at bedtime.    [provider]  isosorbide mononitrate (IMDUR) 30 MG 24 hr tablet Take 30 mg by mouth daily.    [provider]  Multiple Vitamin (MULTIVITAMIN) tablet Take 1 tablet by mouth daily.    [provider]  oxyCODONE-acetaminophen (PERCOCET) 7.5-325 MG per tablet Take 1 tablet by mouth 2 (two) times daily as needed for severe pain.    [provider]  traMADol (ULTRAM) 50 MG tablet Take 50 mg by mouth every 12 (twelve)  hours as needed for moderate pain.    [provider]     Allergies Latex  No family history on file.  Social History Social History   Tobacco Use  . Smoking status: Former Smoker    Types: Cigarettes    Last attempt to quit: 04/29/1999    Years since quitting: 18.6  . Smokeless tobacco: Never Used  Substance Use Topics  . Alcohol use: No  . Drug use: No    Review of Systems  Constitutional: No fevers Eyes: No visual changes.  ENT: No neck pain Cardiovascular: Denies chest pain to me Respiratory: Denies shortness of breath. Gastrointestinal: No abdominal pain.  No nausea, no vomiting.   Genitourinary: Groin discomfort on the right primarily Musculoskeletal: Negative for back pain. Skin: Bruising to the right leg Neurological: Negative forweakness in the legs   ____________________________________________   PHYSICAL EXAM:  VITAL SIGNS: ED Triage Vitals  Enc Vitals Group     BP 12/11/17 1145 (!) 156/83     Pulse Rate 12/11/17 1145 69     Resp 12/11/17 1145 16     Temp 12/11/17 1145 98.2 F (36.8 C)     Temp Source 12/11/17 1145 Oral     SpO2 12/11/17 1145 99 %     Weight 12/11/17 1146 104.3 kg (230 lb)     Height 12/11/17 1146 1.702 m (5\' 7" )     Head Circumference --      Peak Flow --      Pain Score 12/11/17 1145 5     Pain Loc --      Pain Edu? --      Excl. in [place]? --     Constitutional: Alert and oriented. No acute distress. Pleasant and interactive Eyes: Conjunctivae are normal.   Nose: No congestion/rhinnorhea. Mouth/Throat: Mucous membranes are moist.    Cardiovascular: Normal rate, regular rhythm.   Good peripheral circulation. Respiratory: Normal respiratory effort.   Gastrointestinal: Soft and nontender. No distention.  Genitourinary: Right groin, resolving ecchymosis, no significant tenderness palpation.  No discharge.  No erythema to suggest infection  Musculoskeletal: No lower extremity tenderness nor edema.  Warm and well  perfused.  2+ DP pulses on the right Neurologic:  Normal speech and language. No gross focal neurologic deficits are appreciated.  Skin:  Skin is warm, dry,see above Psychiatric: Mood and affect are normal. Speech and behavior are normal.  ____________________________________________   LABS (all labs ordered are listed, but only abnormal results are displayed)  Labs Reviewed - No data to display ____________________________________________  EKG  None ____________________________________________  RADIOLOGY  None ____________________________________________   PROCEDURES  Procedure(s) performed: No  Procedures   Critical Care performed: No ____________________________________________   INITIAL IMPRESSION / ASSESSMENT AND PLAN / ED COURSE  Pertinent labs & imaging results that were available during my care of the patient were reviewed by me and considered in my medical decision making (see chart for  details).  Patient well-appearing in no acute distress.  Discussed ultrasound results with Dr. Lucky Cowboy, given that the patient is improving and mostly asymptomatic he would like to follow-up as an outpatient.  We are attempting to set up outpatient appointment for the patient.  Appt Tuesday 2:15 with Dr. Lucky Cowboy   ____________________________________________   FINAL CLINICAL IMPRESSION(S) / ED DIAGNOSES  Final diagnoses:  Pseudoaneurysm ([place])        Note:  This document was prepared using Dragon voice recognition software and may include unintentional dictation errors.    Lavonia Drafts, MD 12/11/17 1340

## 2017-12-11 NOTE — ED Notes (Signed)
MD Kinner aware of pt presentation and [place] results. No orders at this time

## 2017-12-11 NOTE — Discharge Instructions (Addendum)
Your appointment is scheduled at 2:15 PM with Dr. Lucky Cowboy on 8/20

## 2017-12-15 ENCOUNTER — Encounter (INDEPENDENT_AMBULATORY_CARE_PROVIDER_SITE_OTHER): Payer: Self-pay

## 2017-12-15 ENCOUNTER — Encounter (INDEPENDENT_AMBULATORY_CARE_PROVIDER_SITE_OTHER): Payer: Self-pay | Admitting: Vascular Surgery

## 2017-12-15 ENCOUNTER — Ambulatory Visit (INDEPENDENT_AMBULATORY_CARE_PROVIDER_SITE_OTHER): Payer: BLUE CROSS/BLUE SHIELD | Admitting: Vascular Surgery

## 2017-12-15 VITALS — BP 123/76 | HR 66 | Resp 16 | Ht 67.0 in | Wt 225.8 lb

## 2017-12-15 DIAGNOSIS — T81718A Complication of other artery following a procedure, not elsewhere classified, initial encounter: Secondary | ICD-10-CM

## 2017-12-15 DIAGNOSIS — I724 Aneurysm of artery of lower extremity: Secondary | ICD-10-CM

## 2017-12-15 DIAGNOSIS — I25118 Atherosclerotic heart disease of native coronary artery with other forms of angina pectoris: Secondary | ICD-10-CM | POA: Diagnosis not present

## 2017-12-15 NOTE — Assessment & Plan Note (Signed)
Pseudoaneurysm was after coronary intervention.  Follows with cardiology who is planning a repeat stress test.  Would not do the stress test until after his pseudoaneurysm is repair

## 2017-12-15 NOTE — Assessment & Plan Note (Signed)
[place] could have contributed to access issues creating pseudoaneurysm.

## 2017-12-15 NOTE — Progress Notes (Signed)
Patient ID: Spencer Herring, male   DOB: 1955/06/08, 62 y.o.   MRN: [id number]  Chief Complaint  Patient presents with  . New Patient (Initial Visit)    [place] for psuedo aneurysm    HPI Spencer Herring is a 62 y.o. male.  I am asked to see the patient by Dr. Nehemiah Massed and the [facility] ED for evaluation of right femoral pseudoaneurysm.  The patient reports a cardiac catheterization and intervention earlier this month.  Following this, he developed severe swelling and bruising of the right leg.  The soreness has gotten better but a recent duplex showed a large 5 cm right femoral pseudoaneurysm with flow in the pseudoaneurysm.  There is still a not at the groin site.  He has had significant right leg swelling as part of this process as well.  He was already bothered by painful varicose veins in that right leg.  He is still having chest pain after the procedure.   Past Medical History:  Diagnosis Date  . Anemia   . Asthma   . COPD (chronic obstructive pulmonary disease) ([place])   . Morbid obesity ([place])   . Neuromuscular disorder ([place])   . Sleep apnea     Past Surgical History:  Procedure Laterality Date  . BARIATRIC SURGERY N/A   . COLONOSCOPY N/A 04/02/2015   Procedure: COLONOSCOPY;  Surgeon: Josefine Class, MD;  Location: [facility] ENDOSCOPY;  Service: Endoscopy;  Laterality: N/A;  . COLONOSCOPY WITH PROPOFOL N/A 12/01/2014   Procedure: COLONOSCOPY WITH PROPOFOL;  Surgeon: Josefine Class, MD;  Location: [facility] ENDOSCOPY;  Service: Endoscopy;  Laterality: N/A;  . CORONARY STENT INTERVENTION N/A 12/01/2017   Procedure: CORONARY STENT INTERVENTION;  Surgeon: Isaias Cowman, MD;  Location: [place] CV LAB;  Service: Cardiovascular;  Laterality: N/A;  . LEFT HEART CATH AND CORONARY ANGIOGRAPHY Left 12/01/2017   Procedure: LEFT HEART CATH AND CORONARY ANGIOGRAPHY;  Surgeon: Corey Skains, MD;  Location: [place] CV LAB;  Service: Cardiovascular;  Laterality: Left;  .  PNEUMONECTOMY Left     Family History  Problem Relation Age of Onset  . Stroke Mother   . Heart disease Father   . Heart disease Paternal Grandfather   no bleeding or clotting disorders  Social History Social History   Tobacco Use  . Smoking status: Former Smoker    Types: Cigarettes    Last attempt to quit: 04/29/1999    Years since quitting: 18.6  . Smokeless tobacco: Never Used  Substance Use Topics  . Alcohol use: No  . Drug use: No    Allergies  Allergen Reactions  . Latex Rash    Current Outpatient Medications  Medication Sig Dispense Refill  . albuterol (ACCUNEB) 0.63 MG/3ML nebulizer solution Take 1 ampule by nebulization every 4 (four) hours as needed for wheezing.    Marland Kitchen ALPRAZolam (XANAX) 0.25 MG tablet Take 0.25 mg by mouth at bedtime as needed for anxiety.    Marland Kitchen amLODipine (NORVASC) 2.5 MG tablet Take 1 tablet (2.5 mg total) by mouth daily. 90 tablet 4  . ascorbic acid (VITAMIN C) 250 MG tablet Take 250 mg by mouth daily.    Marland Kitchen aspirin 81 MG chewable tablet Chew 1 tablet (81 mg total) by mouth daily. 90 tablet 4  . atorvastatin (LIPITOR) 40 MG tablet Take 1 tablet (40 mg total) by mouth daily. 90 tablet 4  . budesonide-formoterol (SYMBICORT) 160-4.5 MCG/ACT inhaler Inhale 2 puffs into the lungs daily as needed (SHORTNESS OF BREATH).     Marland Kitchen  clopidogrel (PLAVIX) 75 MG tablet Take 1 tablet (75 mg total) by mouth daily with breakfast. 90 tablet 4  . escitalopram (LEXAPRO) 10 MG tablet Take 10 mg by mouth daily.    . ferrous sulfate 325 (65 FE) MG tablet Take 325 mg by mouth 2 (two) times daily with a meal.     . gabapentin (NEURONTIN) 300 MG capsule Take 300 mg by mouth at bedtime.    . isosorbide mononitrate (IMDUR) 30 MG 24 hr tablet Take 30 mg by mouth daily.    . Multiple Vitamin (MULTIVITAMIN) tablet Take 1 tablet by mouth daily.    Marland Kitchen oxyCODONE-acetaminophen (PERCOCET) 7.5-325 MG per tablet Take 1 tablet by mouth 2 (two) times daily as needed for severe pain.    .  traMADol (ULTRAM) 50 MG tablet Take 50 mg by mouth every 12 (twelve) hours as needed for moderate pain.     No current facility-administered medications for this visit.       REVIEW OF SYSTEMS (Negative unless checked)  Constitutional: _0 Weight loss  _1 Fever  _2 Chills Cardiac: _3 Chest pain   _4 Chest pressure   _5 Palpitations   _6 Shortness of breath when laying flat   _7 Shortness of breath at rest   _8 Shortness of breath with exertion. Vascular:  _9 Pain in legs with walking   _10 Pain in legs at rest   _11 Pain in legs when laying flat   _12 Claudication   _13 Pain in feet when walking  _14 Pain in feet at rest  _15 Pain in feet when laying flat   _16 History of DVT   _17 Phlebitis   _18 Swelling in legs   _19 Varicose veins   _20 Non-healing ulcers Pulmonary:   _21 Uses home oxygen   _22 Productive cough   _23 Hemoptysis   _24 Wheeze  _25 COPD   _26 Asthma Neurologic:  _27 Dizziness  _28 Blackouts   _29 Seizures   _30 History of stroke   _31 History of TIA  _32 Aphasia   _33 Temporary blindness   _34 Dysphagia   _35 Weakness or numbness in arms   _36 Weakness or numbness in legs Musculoskeletal:  _37 Arthritis   _38 Joint swelling   _39 Joint pain   _40 Low back pain Hematologic:  _41 Easy bruising  _42 Easy bleeding   _43 Hypercoagulable state   _44 Anemic  _45 Hepatitis Gastrointestinal:  _46 Blood in stool   _47 Vomiting blood  _48 Gastroesophageal reflux/heartburn   _49 Abdominal pain Genitourinary:  _50 Chronic kidney disease   _51 Difficult urination  _52 Frequent urination  _53 Burning with urination   _54 Hematuria Skin:  _55 Rashes   _56 Ulcers   _57 Wounds Psychological:  _58 History of anxiety   _59  History of major depression.    Physical Exam BP 123/76 (BP Location: Right Arm)   Pulse 66   Resp 16   Ht _60  (1.702 m)   Wt 225 lb 12.8 oz (102.4 kg)   BMI 35.37 kg/m  Gen:  WD/WN, NAD Head: [place]/AT, No temporalis wasting.  Ear/Nose/Throat: Hearing grossly intact, nares w/o erythema or drainage, oropharynx w/o Erythema/Exudate Eyes: Conjunctiva clear, sclera  non-icteric  Neck: trachea midline.   Pulmonary:  Good air movement, respirations not labored, no use of accessory muscles Cardiac: RRR, no JVD Vascular:  Vessel Right Left  Radial Palpable Palpable                                   Gastrointestinal: soft, non-tender/non-distended.  Musculoskeletal: M/S 5/5 throughout.  Extremities without ischemic changes.  No deformity or atrophy. 2+ RLE edema, trace LLE edema. Bruit overlying right femoral artery with increased pulsatility. Neurologic: Sensation  grossly intact in extremities.  Symmetrical.  Speech is fluent. Motor exam as listed above. Psychiatric: Judgment intact, Mood & affect appropriate for pt's clinical situation. Dermatologic: No rashes or ulcers noted.  No cellulitis or open wounds.  Radiology [place] Lower Ext Art Right Ltd  Result Date: 12/11/2017 CLINICAL DATA:  Contusion post recent cardiac catheterization RIGHT EXAM: RIGHT LOWER EXTREMITY ARTERIAL DUPLEX SCAN TECHNIQUE: Ultrasound examination of the lower extremity soft tissues was performed in the area of clinical concern. COMPARISON:  09/12/2011 FINDINGS: 5 X 2.7 X 2.1 cm hematoma overlying the right common femoral artery. Color flow signal noted posteriorly within the collection, with a short narrow neck extending to the common femoral artery consistent with pseudoaneurysm. Smaller 4.5 x 0.9 x 2.5 cm hematoma overlying the distal common femoral vessels. Normal-sized inguinal lymph nodes incidentally noted. IMPRESSION: 1. Partially thrombosed 5 cm pseudoaneurysm from the right common femoral artery. Electronically Signed   By: Lucrezia Europe M.D.   On: 12/11/2017 10:59    Labs Recent Results (from the past 2160 hour(s))  POCT Activated clotting time     Status: None   Collection Time: 12/01/17  8:41 AM  Result Value Ref Range   Activated Clotting Time 318 seconds  Basic metabolic panel     Status: None   Collection Time: 12/02/17  5:12 AM  Result Value Ref Range   Sodium  141 135 - 145 mmol/L   Potassium 4.6 3.5 - 5.1 mmol/L   Chloride 104 98 - 111 mmol/L   CO2 30 22 - 32 mmol/L   Glucose, Bld 99 70 - 99 mg/dL   BUN 18 6 - 20 mg/dL   Creatinine, Ser 0.74 0.61 - 1.24 mg/dL   Calcium 9.2 8.9 - 10.3 mg/dL   GFR calc non Af Amer >60 >60 mL/min   GFR calc Af Amer >60 >60 mL/min    Comment: (NOTE) The eGFR has been calculated using the CKD EPI equation. This calculation has not been validated in all clinical situations. eGFR's persistently <60 mL/min signify possible Chronic Kidney Disease.    Anion gap 7 5 - 15    Comment: Performed at [facility], [address] [place], [place] [address]  CBC     Status: Abnormal   Collection Time: 12/02/17  5:12 AM  Result Value Ref Range   WBC 8.8 3.8 - 10.6 K/uL   RBC 4.40 4.40 - 5.90 MIL/uL   Hemoglobin 13.0 13.0 - 18.0 g/dL   HCT 37.6 (L) 40.0 - 52.0 %   MCV 85.5 80.0 - 100.0 fL   MCH 29.5 26.0 - 34.0 pg   MCHC 34.5 32.0 - 36.0 g/dL   RDW 13.3 11.5 - 14.5 %   Platelets 200 150 - 440 K/uL    Comment: Performed at [facility], [address], [place], [place] [address]    Assessment/Plan:  CAD (coronary artery disease) Pseudoaneurysm was after coronary intervention.  Follows with cardiology who is planning a repeat stress test.  Would not do the stress test until after his pseudoaneurysm is repair  Morbid obesity ([place]) [place] could have contributed to access issues creating pseudoaneurysm.  Pseudoaneurysm of femoral artery following procedure [facility]) The patient has a 5 cm right femoral pseudoaneurysm after cardiac intervention.  I have reviewed his ultrasound and there is flow within the pseudoaneurysm.  I have offered several different options for treatment.  I do not think observation is likely to work with such a large pseudoaneurysm.  I told  him that open surgical therapy would generally be reserved for failures of other means.  I think his best options for treatment would be either  a thrombin injection or a covered stent placement.  Covered stent placement has less ischemic risk, and if the anatomy is suitable that would be my preference.  If the pseudoaneurysm is to near the femoral bifurcation then a thrombin injection would be performed.  Risks and benefits of both procedures were discussed in detail and the patient is agreeable to proceed.  This has been scheduled for Thursday, August 22.      Leotis Pain 12/15/2017, 4:42 PM   This note was created with Dragon medical transcription system.  Any errors from dictation are unintentional.

## 2017-12-15 NOTE — Assessment & Plan Note (Signed)
The patient has a 5 cm right femoral pseudoaneurysm after cardiac intervention.  I have reviewed his ultrasound and there is flow within the pseudoaneurysm.  I have offered several different options for treatment.  I do not think observation is likely to work with such a large pseudoaneurysm.  I told him that open surgical therapy would generally be reserved for failures of other means.  I think his best options for treatment would be either a thrombin injection or a covered stent placement.  Covered stent placement has less ischemic risk, and if the anatomy is suitable that would be my preference.  If the pseudoaneurysm is to near the femoral bifurcation then a thrombin injection would be performed.  Risks and benefits of both procedures were discussed in detail and the patient is agreeable to proceed.  This has been scheduled for Thursday, August 22.

## 2017-12-15 NOTE — Patient Instructions (Signed)
Pseudoaneurysm An aneurysm is a bulge in an artery. A pseudoaneurysm happens when an artery is injured and blood leaks out to form a sac-like bulge. The bulge can break open, causing bleeding in the nearby tissues. What are the causes? The most common cause of this condition is a procedure such as an angiogram in which a thin tube (catheter) is inserted into an artery. After an angiogram, the insertion site on the artery should close back up all the way. If it does not, blood may leak out of the artery. Other causes of a pseudoaneurysminclude:  Trauma to the walls of an artery, such as from a stabbing injury or a deep cut.  Bypass artery grafting surgery, which is a type of surgery that makes blood flow to the heart better.  An infection that affects the walls of an artery.  A heart attack (myocardial infarction).  An aneurysm that bursts (ruptures).  What are the signs or symptoms? Symptoms of this condition include:  Pain, soreness, or tenderness at the site of the pseudoaneurysm.  Swelling.  Bruising or a change in skin color.  A throbbing mass or lump at the site.  How is this diagnosed? This condition may be diagnosed based on your symptoms, a physical exam, and an imaging test called a Doppler ultrasound. This imaging test uses sound waves to show the blood flow in the arteries and the pseudoaneurysm. How is this treated? This condition may go away on its own without treatment. To help prevent bleeding that cannot be controlled, or to help prevent other problems, your health care provider may suggest one of these treatments:  Injecting a blood-clotting enzyme, such as thrombin, into the site.  Fixing the artery with surgery.  Putting pressure (compression) on the pseudoaneurysm.  Follow these instructions at home:  Take over-the-counter and prescription medicines only as told by your health care provider.  Return to your normal activities as told by your health care  provider. Ask your health care provider what activities are safe for you.  Keep all follow-up visits as told by your health care provider. This is important. Contact a health care provider if:  Your pain, soreness, or tenderness at the pseudoaneurysm site keeps getting worse.  You have swelling at the site. Get help right away if:  You have severe or ongoing (persistent) pain at the site of the pseudoaneurysm.  There is bleeding or drainage from the site.  The part of your body where the pseudoaneurysm is located changes color or becomes painful, cold, or numb.  You have chest pain or shortness of breath.  You feel like you might faint or you faint. This information is not intended to replace advice given to you by your health care provider. Make sure you discuss any questions you have with your health care provider. Document Released: 10/01/2007 Document Revised: 01/25/2016 Document Reviewed: 01/25/2016 Elsevier Interactive Patient Education  2018 Elsevier Inc.  

## 2017-12-16 ENCOUNTER — Other Ambulatory Visit (INDEPENDENT_AMBULATORY_CARE_PROVIDER_SITE_OTHER): Payer: Self-pay

## 2017-12-16 MED ORDER — CEFAZOLIN SODIUM-DEXTROSE 2-4 GM/100ML-% IV SOLN
2.0000 g | Freq: Once | INTRAVENOUS | Status: AC
  Administered 2017-12-17: 2 g via INTRAVENOUS

## 2017-12-17 ENCOUNTER — Ambulatory Visit
Admission: RE | Admit: 2017-12-17 | Discharge: 2017-12-17 | Disposition: A | Discharge status: Home / Self Care | Payer: BLUE CROSS/BLUE SHIELD | Source: Ambulatory Visit | Attending: Vascular Surgery | Admitting: Vascular Surgery

## 2017-12-17 ENCOUNTER — Encounter
Admission: RE | Disposition: A | Discharge status: Home / Self Care | Payer: Self-pay | Source: Ambulatory Visit | Attending: Vascular Surgery

## 2017-12-17 DIAGNOSIS — Z9889 Other specified postprocedural states: Secondary | ICD-10-CM | POA: Diagnosis not present

## 2017-12-17 DIAGNOSIS — G709 Myoneural disorder, unspecified: Secondary | ICD-10-CM | POA: Insufficient documentation

## 2017-12-17 DIAGNOSIS — Z91040 Latex allergy status: Secondary | ICD-10-CM | POA: Diagnosis not present

## 2017-12-17 DIAGNOSIS — Z87891 Personal history of nicotine dependence: Secondary | ICD-10-CM | POA: Diagnosis not present

## 2017-12-17 DIAGNOSIS — D649 Anemia, unspecified: Secondary | ICD-10-CM | POA: Diagnosis not present

## 2017-12-17 DIAGNOSIS — Z7982 Long term (current) use of aspirin: Secondary | ICD-10-CM | POA: Diagnosis not present

## 2017-12-17 DIAGNOSIS — I25118 Atherosclerotic heart disease of native coronary artery with other forms of angina pectoris: Secondary | ICD-10-CM | POA: Insufficient documentation

## 2017-12-17 DIAGNOSIS — Z6835 Body mass index (BMI) 35.0-35.9, adult: Secondary | ICD-10-CM | POA: Insufficient documentation

## 2017-12-17 DIAGNOSIS — Z823 Family history of stroke: Secondary | ICD-10-CM | POA: Insufficient documentation

## 2017-12-17 DIAGNOSIS — I724 Aneurysm of artery of lower extremity: Secondary | ICD-10-CM

## 2017-12-17 DIAGNOSIS — Z8249 Family history of ischemic heart disease and other diseases of the circulatory system: Secondary | ICD-10-CM | POA: Insufficient documentation

## 2017-12-17 DIAGNOSIS — J449 Chronic obstructive pulmonary disease, unspecified: Secondary | ICD-10-CM | POA: Diagnosis not present

## 2017-12-17 DIAGNOSIS — Z79899 Other long term (current) drug therapy: Secondary | ICD-10-CM | POA: Insufficient documentation

## 2017-12-17 DIAGNOSIS — Z7902 Long term (current) use of antithrombotics/antiplatelets: Secondary | ICD-10-CM | POA: Diagnosis not present

## 2017-12-17 DIAGNOSIS — Z9884 Bariatric surgery status: Secondary | ICD-10-CM | POA: Diagnosis not present

## 2017-12-17 DIAGNOSIS — Z955 Presence of coronary angioplasty implant and graft: Secondary | ICD-10-CM | POA: Diagnosis not present

## 2017-12-17 HISTORY — PX: LOWER EXTREMITY ANGIOGRAPHY: CATH118251

## 2017-12-17 SURGERY — LOWER EXTREMITY ANGIOGRAPHY
Anesthesia: Moderate Sedation | Laterality: Right

## 2017-12-17 MED ORDER — MORPHINE SULFATE (PF) 2 MG/ML IV SOLN
1.0000 mg | Freq: Once | INTRAVENOUS | Status: AC
  Administered 2017-12-17: 1 mg via INTRAVENOUS

## 2017-12-17 MED ORDER — SODIUM CHLORIDE 0.9 % IV SOLN: 250.0000 mL | INTRAVENOUS | Status: DC | PRN

## 2017-12-17 MED ORDER — LIDOCAINE-EPINEPHRINE (PF) 1 %-1:200000 IJ SOLN
INTRAMUSCULAR | Status: AC
  Filled 2017-12-17: qty 30

## 2017-12-17 MED ORDER — ONDANSETRON HCL 4 MG/2ML IJ SOLN: 4.0000 mg | Freq: Four times a day (QID) | INTRAMUSCULAR | Status: DC | PRN

## 2017-12-17 MED ORDER — SODIUM CHLORIDE 0.9% FLUSH: 3.0000 mL | INTRAVENOUS | Status: DC | PRN

## 2017-12-17 MED ORDER — MIDAZOLAM HCL 5 MG/5ML IJ SOLN
INTRAMUSCULAR | Status: AC
  Filled 2017-12-17: qty 5

## 2017-12-17 MED ORDER — HYDRALAZINE HCL 20 MG/ML IJ SOLN: 5.0000 mg | INTRAMUSCULAR | Status: DC | PRN

## 2017-12-17 MED ORDER — SODIUM CHLORIDE 0.9 % IV SOLN: INTRAVENOUS | Status: DC

## 2017-12-17 MED ORDER — SODIUM CHLORIDE 0.9 % IV SOLN
INTRAVENOUS | Status: DC
  Administered 2017-12-17: 10:00:00 via INTRAVENOUS

## 2017-12-17 MED ORDER — MIDAZOLAM HCL 2 MG/2ML IJ SOLN
INTRAMUSCULAR | Status: DC | PRN
  Administered 2017-12-17: 1 mg via INTRAVENOUS
  Administered 2017-12-17: 2 mg via INTRAVENOUS

## 2017-12-17 MED ORDER — SODIUM CHLORIDE 0.9% FLUSH: 3.0000 mL | Freq: Two times a day (BID) | INTRAVENOUS | Status: DC

## 2017-12-17 MED ORDER — FENTANYL CITRATE (PF) 100 MCG/2ML IJ SOLN
INTRAMUSCULAR | Status: DC | PRN
  Administered 2017-12-17: 50 ug via INTRAVENOUS
  Administered 2017-12-17: 25 ug via INTRAVENOUS

## 2017-12-17 MED ORDER — IOPAMIDOL (ISOVUE-300) INJECTION 61%
INTRAVENOUS | Status: DC | PRN
  Administered 2017-12-17: 25 mL via INTRA_ARTERIAL

## 2017-12-17 MED ORDER — LABETALOL HCL 5 MG/ML IV SOLN: 10.0000 mg | INTRAVENOUS | Status: DC | PRN

## 2017-12-17 MED ORDER — HEPARIN (PORCINE) IN NACL 1000-0.9 UT/500ML-% IV SOLN
INTRAVENOUS | Status: AC
  Filled 2017-12-17: qty 1000

## 2017-12-17 MED ORDER — ACETAMINOPHEN 325 MG PO TABS: 650.0000 mg | ORAL_TABLET | ORAL | Status: DC | PRN

## 2017-12-17 MED ORDER — FENTANYL CITRATE (PF) 100 MCG/2ML IJ SOLN
INTRAMUSCULAR | Status: AC
  Filled 2017-12-17: qty 2

## 2017-12-17 MED ORDER — MORPHINE SULFATE (PF) 2 MG/ML IV SOLN
INTRAVENOUS | Status: AC
  Administered 2017-12-17: 1 mg via INTRAVENOUS
  Filled 2017-12-17: qty 1

## 2017-12-17 MED ORDER — HEPARIN SODIUM (PORCINE) 1000 UNIT/ML IJ SOLN
INTRAMUSCULAR | Status: AC
  Filled 2017-12-17: qty 1

## 2017-12-17 SURGICAL SUPPLY — 9 items
CATH PIG 70CM (CATHETERS) ×3 IMPLANT
COVER PROBE U/S 5X48 (MISCELLANEOUS) ×3 IMPLANT
DEVICE STARCLOSE SE CLOSURE (Vascular Products) ×3 IMPLANT
PACK ANGIOGRAPHY (CUSTOM PROCEDURE TRAY) ×3 IMPLANT
SHEATH BRITE TIP 5FRX11 (SHEATH) ×3 IMPLANT
SYR MEDRAD MARK V 150ML (SYRINGE) ×3 IMPLANT
TOWEL OR 17X26 4PK STRL BLUE (TOWEL DISPOSABLE) ×3 IMPLANT
TUBING CONTRAST HIGH PRESS 72 (TUBING) ×3 IMPLANT
WIRE J 3MM .035X145CM (WIRE) ×3 IMPLANT

## 2017-12-17 NOTE — H&P (Signed)
[facility] History & Physical Update  The patient was interviewed and re-examined.  The patient's previous History and Physical has been reviewed and is unchanged.  There is no change in the plan of care. We plan to proceed with the scheduled procedure.  Leotis Pain, MD  12/17/2017, 8:50 AM

## 2017-12-17 NOTE — Op Note (Signed)
[facility]  Percutaneous Study/Intervention Procedural Note   Date of Surgery: 12/17/2017  Surgeon(s):Joesiah Lonon    Assistants:none  Pre-operative Diagnosis: Pseudoaneurysm right femoral artery after cardiac catheterization  Post-operative diagnosis:  Resolution of pseudoaneurysm right femoral artery with hematoma without active flow  Procedure(s) Performed:             1.  Ultrasound guidance for vascular access left femoral artery             2.  Catheter placement into right common femoral artery from left femoral approach             3.  Aortogram and selective right lower extremity angiogram             4.  StarClose closure device left femoral artery  EBL: 5 cc  Contrast: 25 cc  Fluoro Time: 0.9 minutes  Moderate Conscious Sedation Time: approximately 15 minutes using 3 mg of Versed and 75 Mcg of Fentanyl              Indications:  Patient is a 62 y.o.male with marked bruising and swelling after a cardiac catheterization using right femoral access. The patient has noninvasive study showing a 5 cm right femoral pseudoaneurysm. The patient is brought in for angiography for further evaluation and potential treatment.  Risks and benefits are discussed and informed consent is obtained.   Procedure:  The patient was identified and appropriate procedural time out was performed.  The patient was then placed supine on the table and prepped and draped in the usual sterile fashion. Moderate conscious sedation was administered during a face to face encounter with the patient throughout the procedure with my supervision of the RN administering medicines and monitoring the patient's vital signs, pulse oximetry, telemetry and mental status throughout from the start of the procedure until the patient was taken to the recovery room. Ultrasound was used to evaluate the left common femoral artery.  It was patent .  A digital ultrasound image was acquired.  A Seldinger needle  was used to access the left common femoral artery under direct ultrasound guidance and a permanent image was performed.  A 0.035 J wire was advanced without resistance and a 5Fr sheath was placed.  Pigtail catheter was placed into the aorta and an AP aortogram was performed. This demonstrated normal renal arteries and normal aorta and iliac segments without significant stenosis. I then crossed the aortic bifurcation and advanced to the right femoral head. Selective right lower extremity angiogram was then performed to evaluate the femoral bifurcation for pseudoaneurysm. This demonstrated normal common femoral artery, profunda femoris artery, and proximal superficial femoral artery with no significant atherosclerotic disease.  No extravasation or pseudoaneurysm was seen and multiple different oblique angles were performed of the femoral bifurcation to evaluate for this.  I then used our ultrasound machine that we had gained left femoral access with to further evaluate the right femoral artery.  The area of previous pseudoaneurysm appeared to have no flow detectable on interrogation with only a significant hematoma remaining present.  At this point, it appeared that the pseudoaneurysm had resolved and only a residual hematoma was seen and no intervention would be required.  I elected to terminate the procedure. The sheath was removed and StarClose closure device was deployed in the left femoral artery with excellent hemostatic result. The patient was taken to the recovery room in stable condition having tolerated the procedure well.  Findings:  Aortogram:  This demonstrated normal renal arteries and normal aorta and iliac segments without significant stenosis.             Right lower Extremity:  This demonstrated normal common femoral artery, profunda femoris artery, and proximal superficial femoral artery with no significant atherosclerotic disease.  No extravasation or pseudoaneurysm was seen and  multiple different oblique angles were performed of the femoral bifurcation to evaluate for this.   Disposition: Patient was taken to the recovery room in stable condition having tolerated the procedure well.  Complications: None  Leotis Pain 12/17/2017 10:42 AM   This note was created with Dragon Medical transcription system. Any errors in dictation are purely unintentional.

## 2018-01-14 ENCOUNTER — Ambulatory Visit (INDEPENDENT_AMBULATORY_CARE_PROVIDER_SITE_OTHER): Payer: BLUE CROSS/BLUE SHIELD | Admitting: Vascular Surgery

## 2018-01-14 ENCOUNTER — Encounter (INDEPENDENT_AMBULATORY_CARE_PROVIDER_SITE_OTHER): Payer: Self-pay | Admitting: Vascular Surgery

## 2018-01-14 VITALS — BP 134/77 | HR 55 | Resp 16 | Ht 67.0 in | Wt 220.0 lb

## 2018-01-14 DIAGNOSIS — R600 Localized edema: Secondary | ICD-10-CM

## 2018-01-14 DIAGNOSIS — I83813 Varicose veins of bilateral lower extremities with pain: Secondary | ICD-10-CM

## 2018-01-14 DIAGNOSIS — T81718A Complication of other artery following a procedure, not elsewhere classified, initial encounter: Secondary | ICD-10-CM | POA: Diagnosis not present

## 2018-01-14 DIAGNOSIS — I724 Aneurysm of artery of lower extremity: Secondary | ICD-10-CM

## 2018-01-14 NOTE — Progress Notes (Signed)
Subjective:    Patient ID: Spencer Herring, male    DOB: [birth year], 62 y.o.   MRN: [id number]  Patient presents for his first post procedure follow-up.  The patient underwent a right lower extremity angiogram on December 17, 2017.  The procedure was notable for no pseudo-aneurysm found.  The patient presents today with a chief complaint of bilateral lower extremity edema.  The patient endorses a history of long-standing bilateral lower extremity edema and discomfort along the varicosities located to his bilateral legs for years.  The patient does engage in conservative therapy including wearing medical grade 1 compression socks, elevating his legs and remaining active with minimal improvement to his symptoms.  The patient notes that he does a lot of standing for work.  The patient's edema and discomfort worsens towards the end of the day or with sitting and standing for long periods of time.  The patient denies any claudication-like symptoms, rest pain or ulcer formation to the bilateral legs however feels that the edema and discomfort along his varicosities have progressed to the point that he is unable to function on a daily basis and they have become lifestyle limiting.  The patient denies any recent surgery or trauma to the bilateral legs.  The patient denies any DVT history.  Patient denies any recent bouts of cellulitis.  The patient denies any fever, nausea vomiting.  The patient notes that his access sites have healed well.  Review of Systems  Constitutional: Negative.   HENT: Negative.   Eyes: Negative.   Respiratory: Negative.   Cardiovascular: Positive for leg swelling.  Gastrointestinal: Negative.   Endocrine: Negative.   Genitourinary: Negative.   Musculoskeletal: Negative.   Skin: Negative.   Allergic/Immunologic: Negative.   Neurological: Negative.   Hematological: Negative.   Psychiatric/Behavioral: Negative.       Objective:   Physical Exam  Constitutional: He is oriented  to person, place, and time. He appears well-developed and well-nourished. No distress.  HENT:  Head: Normocephalic and atraumatic.  Right Ear: External ear normal.  Left Ear: External ear normal.  Eyes: Pupils are equal, round, and reactive to light. Conjunctivae and EOM are normal.  Neck: Normal range of motion.  Cardiovascular: Normal rate, regular rhythm, normal heart sounds and intact distal pulses.  Pulses:      Radial pulses are 2+ on the right side, and 2+ on the left side.  Hard to palpate pedal pulses due to body habitus and edema however the bilateral feet are warm.  Good capillary refill bilaterally.  Pulmonary/Chest: Effort normal and breath sounds normal.  Musculoskeletal: Normal range of motion. He exhibits edema (Mild to moderate nonpitting edema noted bilaterally).  Neurological: He is alert and oriented to person, place, and time.  Skin: He is not diaphoretic.  Greater than 1 cm less than 1 cm scattered varicosities noted to the bilateral legs.  There is no stasis dermatitis, fibrosis, cellulitis or active ulcerations noted at this time.  Psychiatric: He has a normal mood and affect. His behavior is normal. Judgment and thought content normal.  Vitals reviewed.  BP 134/77 (BP Location: Right Arm)   Pulse (!) 55   Resp 16   Ht 5\' 7"  (1.702 m)   Wt 220 lb (99.8 kg)   BMI 34.46 kg/m   Past Medical History:  Diagnosis Date  . Anemia   . Asthma   . COPD (chronic obstructive pulmonary disease) ([place])   . Morbid obesity ([place])   . Neuromuscular disorder ([place])   .  Sleep apnea    Social History   Socioeconomic History  . Marital status: Single    Spouse name: Not on file  . Number of children: Not on file  . Years of education: Not on file  . Highest education level: Not on file  Occupational History  . Not on file  Social Needs  . Financial resource strain: Not on file  . Food insecurity:    Worry: Not on file    Inability: Not on file  . Transportation  needs:    Medical: Not on file    Non-medical: Not on file  Tobacco Use  . Smoking status: Former Smoker    Types: Cigarettes    Last attempt to quit: 04/29/1999    Years since quitting: 18.7  . Smokeless tobacco: Never Used  Substance and Sexual Activity  . Alcohol use: No  . Drug use: No  . Sexual activity: Not on file  Lifestyle  . Physical activity:    Days per week: Not on file    Minutes per session: Not on file  . Stress: Not on file  Relationships  . Social connections:    Talks on phone: Not on file    Gets together: Not on file    Attends religious service: Not on file    Active member of club or organization: Not on file    Attends meetings of clubs or organizations: Not on file    Relationship status: Not on file  . Intimate partner violence:    Fear of current or ex partner: Not on file    Emotionally abused: Not on file    Physically abused: Not on file    Forced sexual activity: Not on file  Other Topics Concern  . Not on file  Social History Narrative  . Not on file   Past Surgical History:  Procedure Laterality Date  . BARIATRIC SURGERY N/A   . COLONOSCOPY N/A 04/02/2015   Procedure: COLONOSCOPY;  Surgeon: Josefine Class, MD;  Location: [facility] ENDOSCOPY;  Service: Endoscopy;  Laterality: N/A;  . COLONOSCOPY WITH PROPOFOL N/A 12/01/2014   Procedure: COLONOSCOPY WITH PROPOFOL;  Surgeon: Josefine Class, MD;  Location: [facility] ENDOSCOPY;  Service: Endoscopy;  Laterality: N/A;  . CORONARY STENT INTERVENTION N/A 12/01/2017   Procedure: CORONARY STENT INTERVENTION;  Surgeon: Isaias Cowman, MD;  Location: [place] CV LAB;  Service: Cardiovascular;  Laterality: N/A;  . LEFT HEART CATH AND CORONARY ANGIOGRAPHY Left 12/01/2017   Procedure: LEFT HEART CATH AND CORONARY ANGIOGRAPHY;  Surgeon: Corey Skains, MD;  Location: [place] CV LAB;  Service: Cardiovascular;  Laterality: Left;  . LOWER EXTREMITY ANGIOGRAPHY Right 12/17/2017   Procedure: LOWER  EXTREMITY ANGIOGRAPHY;  Surgeon: Algernon Huxley, MD;  Location: [place] [facility] LAB;  Service: Cardiovascular;  Laterality: Right;  . PNEUMONECTOMY Left    Family History  Problem Relation Age of Onset  . Stroke Mother   . Heart disease Father   . Heart disease Paternal Grandfather    Allergies  Allergen Reactions  . Latex Rash      Assessment & Plan:  Patient presents for his first post procedure follow-up.  The patient underwent a right lower extremity angiogram on December 17, 2017.  The procedure was notable for no pseudo-aneurysm found.  The patient presents today with a chief complaint of bilateral lower extremity edema.  The patient endorses a history of long-standing bilateral lower extremity edema and discomfort along the varicosities located to his bilateral  legs for years.  The patient does engage in conservative therapy including wearing medical grade 1 compression socks, elevating his legs and remaining active with minimal improvement to his symptoms.  The patient notes that he does a lot of standing for work.  The patient's edema and discomfort worsens towards the end of the day or with sitting and standing for long periods of time.  The patient denies any claudication-like symptoms, rest pain or ulcer formation to the bilateral legs however feels that the edema and discomfort along his varicosities have progressed to the point that he is unable to function on a daily basis and they have become lifestyle limiting.  The patient denies any recent surgery or trauma to the bilateral legs.  The patient denies any DVT history.  Patient denies any recent bouts of cellulitis.  The patient denies any fever, nausea vomiting.  The patient notes that his access sites have healed well.  1. Pseudoaneurysm of femoral artery following procedure ([place]) - Stable Patient underwent a right lower extremity angiogram on December 17, 2017 No pseudoaneurysm was found during the procedure. He denies any  claudication-like symptoms, rest pain or ulcer formation to the bilateral lower extremity. No PAD was found on angiogram  2. Bilateral lower extremity edema - New The patient endorses a long-standing history of bilateral lower extremity edema and discomfort along the varicosities noted to the bilateral legs. The patient has been engaging conservative therapy including wearing medical grade 1 compression socks, elevating his legs and remaining active for many years with minimal improvement to his symptoms The patient feels that his symptoms have progressed to the point that they have become lifestyle limiting. Bring the patient back at his convenience to undergo bilateral lower extremity venous duplex to rule out any contributing venous versus lymphatic disease The patient should continue engaging conservative therapy until that ultrasound The patient was instructed to call the office in the interim if any worsening edema or ulcerations to the legs, feet or toes occurs. The patient expresses their understanding.  - VAS [place] LOWER EXTREMITY VENOUS REFLUX; Future  3. Varicose veins of bilateral lower extremities with pain - New As above  - VAS [place] LOWER EXTREMITY VENOUS REFLUX; Future  Current Outpatient Medications on File Prior to Visit  Medication Sig Dispense Refill  . albuterol (ACCUNEB) 0.63 MG/3ML nebulizer solution Take 1 ampule by nebulization every 4 (four) hours as needed for wheezing.    Marland Kitchen ALPRAZolam (XANAX) 0.25 MG tablet Take 0.25 mg by mouth at bedtime as needed for anxiety.    Marland Kitchen amLODipine (NORVASC) 2.5 MG tablet Take 1 tablet (2.5 mg total) by mouth daily. 90 tablet 4  . ascorbic acid (VITAMIN C) 250 MG tablet Take 250 mg by mouth daily.    Marland Kitchen aspirin 81 MG chewable tablet Chew 1 tablet (81 mg total) by mouth daily. 90 tablet 4  . atorvastatin (LIPITOR) 40 MG tablet Take 1 tablet (40 mg total) by mouth daily. 90 tablet 4  . budesonide-formoterol (SYMBICORT) 160-4.5 MCG/ACT  inhaler Inhale 2 puffs into the lungs daily as needed (SHORTNESS OF BREATH).     Marland Kitchen clopidogrel (PLAVIX) 75 MG tablet Take 1 tablet (75 mg total) by mouth daily with breakfast. 90 tablet 4  . escitalopram (LEXAPRO) 10 MG tablet Take 10 mg by mouth daily.    . ferrous sulfate 325 (65 FE) MG tablet Take 325 mg by mouth 2 (two) times daily with a meal.     . gabapentin (NEURONTIN) 300 MG  capsule Take 300 mg by mouth at bedtime.    . isosorbide mononitrate (IMDUR) 30 MG 24 hr tablet Take 30 mg by mouth daily.    . Multiple Vitamin (MULTIVITAMIN) tablet Take 1 tablet by mouth daily.    Marland Kitchen oxyCODONE-acetaminophen (PERCOCET) 7.5-325 MG per tablet Take 1 tablet by mouth 2 (two) times daily as needed for severe pain.    . traMADol (ULTRAM) 50 MG tablet Take 50 mg by mouth every 12 (twelve) hours as needed for moderate pain.     No current facility-administered medications on file prior to visit.    There are no Patient Instructions on file for this visit. No follow-ups on file.  Annie Saephan A Tykerria Mccubbins, PA-C

## 2018-01-22 ENCOUNTER — Encounter (INDEPENDENT_AMBULATORY_CARE_PROVIDER_SITE_OTHER): Payer: Self-pay | Admitting: Vascular Surgery

## 2018-01-22 ENCOUNTER — Ambulatory Visit (INDEPENDENT_AMBULATORY_CARE_PROVIDER_SITE_OTHER): Payer: BLUE CROSS/BLUE SHIELD | Admitting: Vascular Surgery

## 2018-01-22 ENCOUNTER — Ambulatory Visit (INDEPENDENT_AMBULATORY_CARE_PROVIDER_SITE_OTHER): Payer: BLUE CROSS/BLUE SHIELD

## 2018-01-22 VITALS — BP 123/74 | HR 60 | Resp 16 | Ht 67.0 in | Wt 219.6 lb

## 2018-01-22 DIAGNOSIS — I724 Aneurysm of artery of lower extremity: Secondary | ICD-10-CM | POA: Diagnosis not present

## 2018-01-22 DIAGNOSIS — I25118 Atherosclerotic heart disease of native coronary artery with other forms of angina pectoris: Secondary | ICD-10-CM | POA: Diagnosis not present

## 2018-01-22 DIAGNOSIS — T81718A Complication of other artery following a procedure, not elsewhere classified, initial encounter: Secondary | ICD-10-CM

## 2018-01-22 DIAGNOSIS — I83813 Varicose veins of bilateral lower extremities with pain: Secondary | ICD-10-CM

## 2018-01-22 DIAGNOSIS — R600 Localized edema: Secondary | ICD-10-CM

## 2018-01-22 NOTE — Patient Instructions (Signed)

## 2018-01-22 NOTE — Assessment & Plan Note (Signed)
Venous duplex today showed no evidence of deep venous thrombosis, superficial thrombophlebitis, or significant venous reflux in either lower extremity. Likely lymphedema from chronic scarring of the lymphatic channels, stage 1.  I believe the patient would benefit from a lymphedema pump and we will try to obtain that as a adjuvant therapy.  He should continue with compression and elevation.  I will see him back in about 6 months.

## 2018-01-22 NOTE — Progress Notes (Signed)
MRN : [id number]  Spencer Herring is a 62 y.o. ([birth year]) male who presents with chief complaint of  Chief Complaint  Patient presents with  . Follow-up    bil venous reflux ultrasound  .  History of Present Illness: Patient returns today in follow up of leg pain and swelling.  Compression stockings and elevation really have not provided much in the way of relief of his symptoms.  His legs are about the same.  No new ulceration or infection.  Venous duplex today showed no evidence of deep venous thrombosis, superficial thrombophlebitis, or significant venous reflux in either lower extremity.  Current Outpatient Medications  Medication Sig Dispense Refill  . albuterol (ACCUNEB) 0.63 MG/3ML nebulizer solution Take 1 ampule by nebulization every 4 (four) hours as needed for wheezing.    Marland Kitchen ALPRAZolam (XANAX) 0.25 MG tablet Take 0.25 mg by mouth at bedtime as needed for anxiety.    Marland Kitchen amLODipine (NORVASC) 2.5 MG tablet Take 1 tablet (2.5 mg total) by mouth daily. 90 tablet 4  . ascorbic acid (VITAMIN C) 250 MG tablet Take 250 mg by mouth daily.    Marland Kitchen aspirin 81 MG chewable tablet Chew 1 tablet (81 mg total) by mouth daily. 90 tablet 4  . atorvastatin (LIPITOR) 40 MG tablet Take 1 tablet (40 mg total) by mouth daily. 90 tablet 4  . budesonide-formoterol (SYMBICORT) 160-4.5 MCG/ACT inhaler Inhale 2 puffs into the lungs daily as needed (SHORTNESS OF BREATH).     Marland Kitchen clopidogrel (PLAVIX) 75 MG tablet Take 1 tablet (75 mg total) by mouth daily with breakfast. 90 tablet 4  . escitalopram (LEXAPRO) 10 MG tablet Take 10 mg by mouth daily.    . ferrous sulfate 325 (65 FE) MG tablet Take 325 mg by mouth 2 (two) times daily with a meal.     . gabapentin (NEURONTIN) 300 MG capsule Take 300 mg by mouth at bedtime.    . isosorbide mononitrate (IMDUR) 30 MG 24 hr tablet Take 30 mg by mouth daily.    . Multiple Vitamin (MULTIVITAMIN) tablet Take 1 tablet by mouth daily.    Marland Kitchen oxyCODONE-acetaminophen (PERCOCET)  7.5-325 MG per tablet Take 1 tablet by mouth 2 (two) times daily as needed for severe pain.    . traMADol (ULTRAM) 50 MG tablet Take 50 mg by mouth every 12 (twelve) hours as needed for moderate pain.     No current facility-administered medications for this visit.     Past Medical History:  Diagnosis Date  . Anemia   . Asthma   . COPD (chronic obstructive pulmonary disease) ([place])   . Morbid obesity ([place])   . Neuromuscular disorder ([place])   . Sleep apnea     Past Surgical History:  Procedure Laterality Date  . BARIATRIC SURGERY N/A   . COLONOSCOPY N/A 04/02/2015   Procedure: COLONOSCOPY;  Surgeon: Josefine Class, MD;  Location: [facility] ENDOSCOPY;  Service: Endoscopy;  Laterality: N/A;  . COLONOSCOPY WITH PROPOFOL N/A 12/01/2014   Procedure: COLONOSCOPY WITH PROPOFOL;  Surgeon: Josefine Class, MD;  Location[facility] ENDOSCOPY;  Service: Endoscopy;  Laterality: N/A;  . CORONARY STENT INTERVENTION N/A 12/01/2017   Procedure: CORONARY STENT INTERVENTION;  Surgeon: Isaias Cowman, MD;  Location: [place] CV LAB;  Service: Cardiovascular;  Laterality: N/A;  . LEFT HEART CATH AND CORONARY ANGIOGRAPHY Left 12/01/2017   Procedure: LEFT HEART CATH AND CORONARY ANGIOGRAPHY;  Surgeon: Corey Skains, MD;  Location: [place] CV LAB;  Service: Cardiovascular;  Laterality: Left;  . LOWER EXTREMITY ANGIOGRAPHY Right 12/17/2017   Procedure: LOWER EXTREMITY ANGIOGRAPHY;  Surgeon: Algernon Huxley, MD;  Location: [place] CV LAB;  Service: Cardiovascular;  Laterality: Right;  . PNEUMONECTOMY Left     Family History  Problem Relation Age of Onset  . Stroke Mother   . Heart disease Father   . Heart disease Paternal Grandfather   no bleeding or clotting disorders  Social History Social History        Tobacco Use  . Smoking status: Former Smoker    Types: Cigarettes    Last attempt to quit: 04/29/1999    Years since quitting: 18.6  . Smokeless tobacco: Never Used    Substance Use Topics  . Alcohol use: No  . Drug use: No     Allergies  Allergen Reactions  . Latex Rash    REVIEW OF SYSTEMS (Negative unless checked)  Constitutional: [] Weight loss  [] Fever  [] Chills Cardiac: [x] Chest pain   [x] Chest pressure   [] Palpitations   [] Shortness of breath when laying flat   [] Shortness of breath at rest   [x] Shortness of breath with exertion. Vascular:  [] Pain in legs with walking   [] Pain in legs at rest   [] Pain in legs when laying flat   [] Claudication   [] Pain in feet when walking  [] Pain in feet at rest  [] Pain in feet when laying flat   [] History of DVT   [] Phlebitis   [x] Swelling in legs   [x] Varicose veins   [] Non-healing ulcers Pulmonary:   [] Uses home oxygen   [] Productive cough   [] Hemoptysis   [] Wheeze  [x] COPD   [] Asthma Neurologic:  [] Dizziness  [] Blackouts   [] Seizures   [] History of stroke   [] History of TIA  [] Aphasia   [] Temporary blindness   [] Dysphagia   [] Weakness or numbness in arms   [] Weakness or numbness in legs Musculoskeletal:  [x] Arthritis   [] Joint swelling   [] Joint pain   [] Low back pain Hematologic:  [] Easy bruising  [] Easy bleeding   [] Hypercoagulable state   [x] Anemic  [] Hepatitis Gastrointestinal:  [] Blood in stool   [] Vomiting blood  [] Gastroesophageal reflux/heartburn   [] Abdominal pain Genitourinary:  [] Chronic kidney disease   [] Difficult urination  [] Frequent urination  [] Burning with urination   [] Hematuria Skin:  [] Rashes   [] Ulcers   [] Wounds Psychological:  [] History of anxiety   []  History of major depression.   Physical Examination  BP 123/74 (BP Location: Right Arm)   Pulse 60   Resp 16   Ht 5' 7"  (1.702 m)   Wt 219 lb 9.6 oz (99.6 kg)   BMI 34.39 kg/m  Gen:  WD/WN, NAD Head: [place]/AT, No temporalis wasting. Ear/Nose/Throat: Hearing grossly intact, nares w/o erythema or drainage Eyes: Conjunctiva clear. Sclera non-icteric Neck: Supple.  Trachea midline Pulmonary:  Good air movement, no use of  accessory muscles.  Cardiac: RRR, no JVD Vascular:  Vessel Right Left  Radial Palpable Palpable                           Musculoskeletal: M/S 5/5 throughout.  No deformity or atrophy. Mild BLE edema. Neurologic: Sensation grossly intact in extremities.  Symmetrical.  Speech is fluent.  Psychiatric: Judgment intact, Mood & affect appropriate for pt's clinical situation. Dermatologic: No rashes or ulcers noted.  No cellulitis or open wounds.       Labs Recent Results (from the past 2160 hour(s))  POCT Activated clotting time  Status: None   Collection Time: 12/01/17  8:41 AM  Result Value Ref Range   Activated Clotting Time 318 seconds  Basic metabolic panel     Status: None   Collection Time: 12/02/17  5:12 AM  Result Value Ref Range   Sodium 141 135 - 145 mmol/L   Potassium 4.6 3.5 - 5.1 mmol/L   Chloride 104 98 - 111 mmol/L   CO2 30 22 - 32 mmol/L   Glucose, Bld 99 70 - 99 mg/dL   BUN 18 6 - 20 mg/dL   Creatinine, Ser 0.74 0.61 - 1.24 mg/dL   Calcium 9.2 8.9 - 10.3 mg/dL   GFR calc non Af Amer >60 >60 mL/min   GFR calc Af Amer >60 >60 mL/min    Comment: (NOTE) The eGFR has been calculated using the CKD EPI equation. This calculation has not been validated in all clinical situations. eGFR's persistently <60 mL/min signify possible Chronic Kidney Disease.    Anion gap 7 5 - 15    Comment: Performed at [facility], [address] [place], [place] [address]  CBC     Status: Abnormal   Collection Time: 12/02/17  5:12 AM  Result Value Ref Range   WBC 8.8 3.8 - 10.6 K/uL   RBC 4.40 4.40 - 5.90 MIL/uL   Hemoglobin 13.0 13.0 - 18.0 g/dL   HCT 37.6 (L) 40.0 - 52.0 %   MCV 85.5 80.0 - 100.0 fL   MCH 29.5 26.0 - 34.0 pg   MCHC 34.5 32.0 - 36.0 g/dL   RDW 13.3 11.5 - 14.5 %   Platelets 200 150 - 440 K/uL    Comment: Performed at [facility], [address] [place],  [address]    Radiology No results found.  Assessment/Plan CAD  (coronary artery disease) Pseudoaneurysm was after coronary intervention.  Follows with cardiology who is planning a repeat stress test.  pseudoaneurysm resolved now.   Morbid obesity ([place]) [place] could have contributed to access issues creating pseudoaneurysm.  Bilateral lower extremity edema Venous duplex today showed no evidence of deep venous thrombosis, superficial thrombophlebitis, or significant venous reflux in either lower extremity. Likely lymphedema from chronic scarring of the lymphatic channels, stage 1.  I believe the patient would benefit from a lymphedema pump and we will try to obtain that as a adjuvant therapy.  He should continue with compression and elevation.  I will see him back in about 6 months.    Leotis Pain, MD  01/22/2018 4:15 PM    This note was created with Dragon medical transcription system.  Any errors from dictation are purely unintentional

## 2018-01-26 DIAGNOSIS — I10 Essential (primary) hypertension: Secondary | ICD-10-CM | POA: Insufficient documentation

## 2018-06-08 ENCOUNTER — Inpatient Hospital Stay: Payer: BLUE CROSS/BLUE SHIELD

## 2018-06-08 ENCOUNTER — Inpatient Hospital Stay: Payer: BLUE CROSS/BLUE SHIELD | Attending: Hematology and Oncology | Admitting: Hematology and Oncology

## 2018-06-08 ENCOUNTER — Encounter: Payer: Self-pay | Admitting: Hematology and Oncology

## 2018-06-08 VITALS — BP 125/78 | HR 56 | Temp 97.5°F | Resp 18 | Ht 67.0 in | Wt 222.7 lb

## 2018-06-08 DIAGNOSIS — Z959 Presence of cardiac and vascular implant and graft, unspecified: Secondary | ICD-10-CM | POA: Insufficient documentation

## 2018-06-08 DIAGNOSIS — Z87891 Personal history of nicotine dependence: Secondary | ICD-10-CM | POA: Diagnosis not present

## 2018-06-08 DIAGNOSIS — D508 Other iron deficiency anemias: Secondary | ICD-10-CM | POA: Insufficient documentation

## 2018-06-08 DIAGNOSIS — J449 Chronic obstructive pulmonary disease, unspecified: Secondary | ICD-10-CM | POA: Diagnosis not present

## 2018-06-08 DIAGNOSIS — Z9884 Bariatric surgery status: Secondary | ICD-10-CM | POA: Diagnosis not present

## 2018-06-08 DIAGNOSIS — D509 Iron deficiency anemia, unspecified: Secondary | ICD-10-CM | POA: Diagnosis present

## 2018-06-08 LAB — CBC WITH DIFFERENTIAL/PLATELET
Abs Immature Granulocytes: 0.03 10*3/uL (ref 0.00–0.07)
BASOS PCT: 1 %
Basophils Absolute: 0 10*3/uL (ref 0.0–0.1)
EOS PCT: 3 %
Eosinophils Absolute: 0.2 10*3/uL (ref 0.0–0.5)
HCT: 36.4 % — ABNORMAL LOW (ref 39.0–52.0)
HEMOGLOBIN: 11.8 g/dL — AB (ref 13.0–17.0)
Immature Granulocytes: 1 %
LYMPHS PCT: 25 %
Lymphs Abs: 1.7 10*3/uL (ref 0.7–4.0)
MCH: 28.5 pg (ref 26.0–34.0)
MCHC: 32.4 g/dL (ref 30.0–36.0)
MCV: 87.9 fL (ref 80.0–100.0)
Monocytes Absolute: 0.6 10*3/uL (ref 0.1–1.0)
Monocytes Relative: 9 %
NEUTROS ABS: 4.1 10*3/uL (ref 1.7–7.7)
NRBC: 0 % (ref 0.0–0.2)
Neutrophils Relative %: 61 %
Platelets: 200 10*3/uL (ref 150–400)
RBC: 4.14 MIL/uL — AB (ref 4.22–5.81)
RDW: 13.7 % (ref 11.5–15.5)
WBC: 6.6 10*3/uL (ref 4.0–10.5)

## 2018-06-08 LAB — IRON AND TIBC
Iron: 82 ug/dL (ref 45–182)
Saturation Ratios: 21 % (ref 17.9–39.5)
TIBC: 394 ug/dL (ref 250–450)
UIBC: 312 ug/dL

## 2018-06-08 LAB — FOLATE: Folate: 26 ng/mL (ref >5.9)

## 2018-06-08 LAB — FERRITIN: Ferritin: 16 ng/mL — ABNORMAL LOW (ref 24–336)

## 2018-06-08 NOTE — Progress Notes (Signed)
[facility] [facility] day:  06/08/2018  Chief Complaint: Spencer Herring is a 63 y.o. male s/p gastric bypass surgery and iron deficiency anemia who is referred in consultation by Dr. Tracie Harrier for assessment and management.  HPI:  Patient underwent laparoscopic gastric sleeve surgery on 01/04/2013.  Post surgery, patient was on multi-vitamins.  He was on B12 pills then parental B12 x 3 years.  He has been on oral iron BID x 10 years.  He was diagnosed with iron deficiency.  He underwent EGD on 01/04/2013 which revealed a normal esophagus with non-bleeding erosive gastropathy.  Gastric remnant biopsies revealed focal superficial vascular congestion and increase in parietal cell size with atypical protrusions.  There was no dysplasia or malignancy.  Colonoscopy on 12/01/2014 revealed non-thrombosed external hemorrhoids on perianal exam.  There were 2 ulcers at the hepatic flexure. Biopsied. Tattooed just distal to the ulcers. ? Ischemia vs IBD vs malignancy.  There was diverticulosis in the sigmoid colon.  Multiple biopsies were obtained in the proximal transverse colon, ascending colon, rectum and sigmoid colon.  Pathology from the ulcers at the hepatic flexure revealed focally inflamed with ischemia and reactive epithelial atypia.  There were separate fragment of fibrinopurulent debris c/w ulceration.  There was no malignancy.  Additional biopsies were negative.  Colonoscopy on 04/02/2015 by Dr. Arther Dames revealed diverticulosis in the sigmoid colon.  Ulcers were no longer present.  Repeat colonoscopy planned for 5 years.  CBC on 05/14/2018 revealed a hematocrit of 35.7, hemoglobin 11.4, MCV 89.3, platelets 201,000, WBC 7300 with an ANC of 4690.  Creatinine was 0.8, albumen 3.9, protein 6.3, and normal LFTs.  TSH was 2.145.  Urinalysis revealed no hematuria.  Ferritin has been followed:  20 on 06/30/2014, 16 on 11/03/2014, 37 on 02/28/2015, 22 on  12/28/2015, and 19 on 02/13/2017.  Guaiac cards were negative on 01/09/2016 and 10/24/2016.  Symptomatically, he has been increasingly fatigued over the last 6-12 months. He notes that he is essentially anergic. 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 He notes frequent headaches. Patient has episodes of chest pain and associated shortness of breath that are mainly associated with increased stress.  Of note, patient has undergone PCI with stent placement in the past. He is followed by cardiology. He is on chronic anticoagulation therapy (rivaroxaban) daily.   He complains of chronic BILATERAL lower extremity edema (R>L). Patient with polyarthralgia related to an OA diagnosis. He takes oxycodone on a PRN basis for the pain. Patient denies bleeding; no hematochezia, melena, or gross hematuria.   Stool is dark with oral iron.  He has never required IV iron infusions or blood transfusions.   Patient advises that he maintains an adequate appetite. He is eating well. Weight today is 222 lb 10.6 oz (101 kg). He denies ice pica. He has had restless legs since he was younger.   Patient denies pain in the clinic today.  His mother has a history of MDS.  She received "iron shots and plasma".   Past Medical History:  Diagnosis Date  . Anemia   . Asthma   . COPD (chronic obstructive pulmonary disease) ([place])   . Morbid obesity ([place])   . Neuromuscular disorder ([place])   . Sleep apnea     Past Surgical History:  Procedure Laterality Date  . BARIATRIC SURGERY N/A   . COLONOSCOPY N/A 04/02/2015   Procedure: COLONOSCOPY;  Surgeon: Josefine Class, MD;  Location: [facility] ENDOSCOPY;  Service: Endoscopy;  Laterality: N/A;  . COLONOSCOPY WITH PROPOFOL N/A 12/01/2014   Procedure: COLONOSCOPY WITH PROPOFOL;  Surgeon: Josefine Class, MD;  Location: [facility] ENDOSCOPY;  Service: Endoscopy;  Laterality: N/A;  . CORONARY STENT INTERVENTION N/A 12/01/2017   Procedure: CORONARY STENT INTERVENTION;   Surgeon: Isaias Cowman, MD;  Location: [facility] CV LAB;  Service: Cardiovascular;  Laterality: N/A;  . LEFT HEART CATH AND CORONARY ANGIOGRAPHY Left 12/01/2017   Procedure: LEFT HEART CATH AND CORONARY ANGIOGRAPHY;  Surgeon: Corey Skains, MD;  Location: [facility];  Service: Cardiovascular;  Laterality: Left;  . LOWER EXTREMITY ANGIOGRAPHY Right 12/17/2017   Procedure: LOWER EXTREMITY ANGIOGRAPHY;  Surgeon: Algernon Huxley, MD;  Location: [facility];  Service: Cardiovascular;  Laterality: Right;  . PNEUMONECTOMY Left     Family History  Problem Relation Age of Onset  . Stroke Mother   . Heart disease Father   . Heart disease Paternal Grandfather     Social History:  reports that he quit smoking about 19 years ago. His smoking use included cigarettes. He has never used smokeless tobacco. He reports that he does not drink alcohol or use drugs.  Former smoker; quit x 19 years ago. He does not use alcohol. Employed full time, as a Librarian, academic, in a Writer. He lives in [place].  The patient is alone today.  Allergies:  Allergies  Allergen Reactions  . Latex Rash    Current Medications: Current Outpatient Medications  Medication Sig Dispense Refill  . amLODipine (NORVASC) 2.5 MG tablet Take 1 tablet (2.5 mg total) by mouth daily. 90 tablet 4  . ascorbic acid (VITAMIN C) 250 MG tablet Take 250 mg by mouth daily.    Marland Kitchen aspirin 81 MG chewable tablet Chew 1 tablet (81 mg total) by mouth daily. 90 tablet 4  . atorvastatin (LIPITOR) 40 MG tablet Take 1 tablet (40 mg total) by mouth daily. 90 tablet 4  . clopidogrel (PLAVIX) 75 MG tablet Take 1 tablet (75 mg total) by mouth daily with breakfast. 90 tablet 4  . escitalopram (LEXAPRO) 10 MG tablet Take 10 mg by mouth daily.    . ferrous sulfate 325 (65 FE) MG tablet Take 325 mg by mouth 2 (two) times daily with a meal.     . gabapentin (NEURONTIN) 300 MG capsule Take 300 mg by mouth at bedtime.    . isosorbide  mononitrate (IMDUR) 30 MG 24 hr tablet Take 30 mg by mouth daily.    . Multiple Vitamin (MULTIVITAMIN) tablet Take 1 tablet by mouth daily.    Marland Kitchen oxyCODONE-acetaminophen (PERCOCET) 7.5-325 MG per tablet Take 1 tablet by mouth 2 (two) times daily as needed for severe pain.    . traMADol (ULTRAM) 50 MG tablet Take 50 mg by mouth every 12 (twelve) hours as needed for moderate pain.    Marland Kitchen albuterol (ACCUNEB) 0.63 MG/3ML nebulizer solution Take 1 ampule by nebulization every 4 (four) hours as needed for wheezing.    Marland Kitchen ALPRAZolam (XANAX) 0.25 MG tablet Take 0.25 mg by mouth at bedtime as needed for anxiety.    . budesonide-formoterol (SYMBICORT) 160-4.5 MCG/ACT inhaler Inhale 2 puffs into the lungs daily as needed (SHORTNESS OF BREATH).      No current facility-administered medications for this visit.     Review of Systems:  GENERAL:  Tired.  "Doesn't feel like doing anything".  No fevers, sweats.  Weight loss s/p gastric bypass surgery. PERFORMANCE STATUS (ECOG):  1 HEENT:  No visual changes, runny nose, sore throat, mouth sores or tenderness. Lungs: No shortness of breath or cough.  No hemoptysis. Cardiac:  Chest pain induced by stress. s/p cardiac stent.  No palpitations, orthopnea, or PND. GI:  No nausea, vomiting, diarrhea, constipation, melena or hematochezia. GU:  No urgency, frequency, dysuria, or hematuria. Musculoskeletal:  No back pain.  Arthritis.  Feet and legs hurt continuously.  No muscle tenderness. Extremities:  No pain or swelling. Skin:  No rashes or skin changes. Neuro:  Headaches.  No numbness or weakness, balance or coordination issues. Endocrine:  No diabetes, thyroid issues, hot flashes or night sweats. Psych:  No mood changes, depression or anxiety. Pain:  No focal pain. Review of systems:  All other systems reviewed and found to be negative.  Physical Exam: Blood pressure 125/78, pulse (!) 56, temperature (!) 97.5 F (36.4 C), temperature source Tympanic, resp. rate  18, height 5' 7" (1.702 m), weight 222 lb 10.6 oz (101 kg), SpO2 100 %. GENERAL:  Well developed, well nourished, gentleman sitting comfortably in the exam room in no acute distress. MENTAL STATUS:  Alert and oriented to person, place and time. HEAD:  Pearline Cables hair.  Goatee.  Normocephalic, atraumatic, face symmetric, no Cushingoid features. EYES:  Glasses.  Blue eyes.  Pupils equal round and reactive to light and accomodation.  No conjunctivitis or scleral icterus. ENT:  Oropharynx clear without lesion.  Tongue normal. Mucous membranes moist.  RESPIRATORY:  Clear to auscultation without rales, wheezes or rhonchi. CARDIOVASCULAR:  Regular rate and rhythm without murmur, rub or gallop. ABDOMEN:  Umbilical hernia.  Soft, non-tender, with active bowel sounds, and no hepatosplenomegaly.  No masses. SKIN:  Few abdominal bruises.  No rashes, ulcers or lesions. EXTREMITIES: 1+ right lower extremity edema below knee.  No skin discoloration or tenderness.  No palpable cords. LYMPH NODES: No palpable cervical, supraclavicular, axillary or inguinal adenopathy  NEUROLOGICAL: Unremarkable. PSYCH:  Appropriate.   No visits with results within 3 Day(s) from this visit.  Latest known visit with results is:  Admission on 12/01/2017, Discharged on 12/02/2017  Component Date Value Ref Range Status  . Activated Clotting Time 12/01/2017 318  seconds Final  . Sodium 12/02/2017 141  135 - 145 mmol/L Final  . Potassium 12/02/2017 4.6  3.5 - 5.1 mmol/L Final  . Chloride 12/02/2017 104  98 - 111 mmol/L Final  . CO2 12/02/2017 30  22 - 32 mmol/L Final  . Glucose, Bld 12/02/2017 99  70 - 99 mg/dL Final  . BUN 12/02/2017 18  6 - 20 mg/dL Final  . Creatinine, Ser 12/02/2017 0.74  0.61 - 1.24 mg/dL Final  . Calcium 12/02/2017 9.2  8.9 - 10.3 mg/dL Final  . GFR calc non Af Amer 12/02/2017 >60  >60 mL/min Final  . GFR calc Af Amer 12/02/2017 >60  >60 mL/min Final   Comment: (NOTE) The eGFR has been calculated using the  CKD EPI equation. This calculation has not been validated in all clinical situations. eGFR's persistently <60 mL/min signify possible Chronic Kidney Disease.   Georgiann Hahn gap 12/02/2017 7  5 - 15 Final   Performed at [facility], [address] [place], [place] [address]  . WBC 12/02/2017 8.8  3.8 - 10.6 K/uL Final  . RBC 12/02/2017 4.40  4.40 - 5.90 MIL/uL Final  . Hemoglobin 12/02/2017 13.0  13.0 - 18.0 g/dL Final  . HCT 12/02/2017 37.6* 40.0 - 52.0 % Final  . MCV 12/02/2017 85.5  80.0 -  100.0 fL Final  . MCH 12/02/2017 29.5  26.0 - 34.0 pg Final  . MCHC 12/02/2017 34.5  32.0 - 36.0 g/dL Final  . RDW 12/02/2017 13.3  11.5 - 14.5 % Final  . Platelets 12/02/2017 200  150 - 440 K/uL Final   Performed at [facility], [address] [place], [place] [address]    Assessment:  CORDARREL STIEFEL is a 63 y.o. male with iron deficiency anemia secondary to gastric bypass surgery.  Diet is good.  Labs on 05/14/2018 revealed a hematocrit of 35.7, hemoglobin 11.4, MCV 89.3, platelets 201,000, WBC 7300 with an ANC of 4690.  Normal studies included: creatinine, albumen, protein, LFTs, and TSH.  Urinalysis revealed no hematuria.  Ferritin has been followed:  20 on 06/30/2014, 16 on 11/03/2014, 37 on 02/28/2015, 22 on 12/28/2015, and 19 on 02/13/2017.  EGD on 01/04/2013 revealed a normal esophagus with non-bleeding erosive gastropathy.  Gastric remnant biopsies revealed focal superficial vascular congestion and increase in parietal cell size with atypical protrusions.  There was no dysplasia or malignancy.  Colonoscopy on 12/01/2014 revealed 2 ulcers at the hepatic flexure. There was diverticulosis in the sigmoid colon.  Pathology from the ulcers at the hepatic flexure revealed focally inflamed with ischemia and reactive epithelial atypia.  There were separate fragment of fibrinopurulent debris c/w ulceration.  There was no malignancy.  Additional biopsies were negative. Colonoscopy on  04/02/2015 revealed diverticulosis in the sigmoid colon.  Ulcers were no longer present.  Repeat colonoscopy is planned for 5 years.  Guaiac cards were negative on 01/09/2016 and 10/24/2016.  Symptomatically, he is fatigued.  He denies any melena, hematochezia or hematuria.  Stool is dark on oral iron.  Hemoglobin is 11.8.  Ferritin is 16.  Plan: 1.   Labs today:  CBC with diff, ferritin, iron studies, folate. 2.   Iron deficiency anemia   Patient is s/p gastric bypass surgery with poor absorption of oral ion.  Patient has a history of erosive gastropathy and ulcers in the hepatic flexure.  Urinalysis revealed no hematuria.  No clinical evidence of bleeding.  Patient has been on oral iron x 10 years.  Discuss plan for Feraheme weekly x 2 to replete iron stores.    Ferritin goal 100.  Discuss maintaining ferritin > 30 in the future to prevent symptoms. 3.   RTC in 6 weeks for labs (CBC with diff, ferritin). 4.   RTC in 3 months for MD assessment, labs (CBC with diff, ferritin- day before), and +/- Feraheme.   Honor Loh, NP  06/08/2018, 11:56 AM   I saw and evaluated the patient, participating in the key portions of the service and reviewing pertinent diagnostic studies and records.  I reviewed the nurse practitioner's note and agree with the findings and the plan.  The assessment and plan were discussed with the patient. Multiple questions were asked by the patient and answered.   Nolon Stalls, MD 06/08/2018,11:56 AM

## 2018-06-11 ENCOUNTER — Ambulatory Visit: Payer: BLUE CROSS/BLUE SHIELD

## 2018-06-11 ENCOUNTER — Inpatient Hospital Stay: Payer: BLUE CROSS/BLUE SHIELD

## 2018-06-11 VITALS — BP 133/63 | HR 59 | Temp 97.4°F | Resp 18

## 2018-06-11 DIAGNOSIS — D509 Iron deficiency anemia, unspecified: Secondary | ICD-10-CM

## 2018-06-11 DIAGNOSIS — D508 Other iron deficiency anemias: Secondary | ICD-10-CM

## 2018-06-11 MED ORDER — SODIUM CHLORIDE 0.9 % IV SOLN
Freq: Once | INTRAVENOUS | Status: AC
  Administered 2018-06-11: 11:00:00 via INTRAVENOUS
  Filled 2018-06-11: qty 250

## 2018-06-11 MED ORDER — SODIUM CHLORIDE 0.9% FLUSH
3.0000 mL | Freq: Once | INTRAVENOUS | Status: DC | PRN
  Filled 2018-06-11: qty 3

## 2018-06-11 MED ORDER — SODIUM CHLORIDE 0.9 % IV SOLN
510.0000 mg | Freq: Once | INTRAVENOUS | Status: AC
  Administered 2018-06-11: 510 mg via INTRAVENOUS
  Filled 2018-06-11: qty 17

## 2018-06-11 MED ORDER — SODIUM CHLORIDE 0.9% FLUSH
10.0000 mL | Freq: Once | INTRAVENOUS | Status: DC | PRN
  Filled 2018-06-11: qty 10

## 2018-06-11 NOTE — Patient Instructions (Signed)

## 2018-06-18 ENCOUNTER — Inpatient Hospital Stay: Payer: BLUE CROSS/BLUE SHIELD

## 2018-06-18 VITALS — BP 109/63 | HR 54 | Temp 95.9°F | Resp 18

## 2018-06-18 DIAGNOSIS — D509 Iron deficiency anemia, unspecified: Secondary | ICD-10-CM | POA: Diagnosis not present

## 2018-06-18 DIAGNOSIS — K9589 Other complications of other bariatric procedure: Secondary | ICD-10-CM

## 2018-06-18 MED ORDER — SODIUM CHLORIDE 0.9% FLUSH
3.0000 mL | Freq: Once | INTRAVENOUS | Status: DC | PRN
  Filled 2018-06-18: qty 3

## 2018-06-18 MED ORDER — SODIUM CHLORIDE 0.9 % IV SOLN
510.0000 mg | Freq: Once | INTRAVENOUS | Status: AC
  Administered 2018-06-18: 510 mg via INTRAVENOUS
  Filled 2018-06-18: qty 17

## 2018-06-18 MED ORDER — SODIUM CHLORIDE 0.9 % IV SOLN
Freq: Once | INTRAVENOUS | Status: AC
  Administered 2018-06-18: 11:00:00 via INTRAVENOUS
  Filled 2018-06-18: qty 250

## 2018-06-18 MED ORDER — SODIUM CHLORIDE 0.9% FLUSH
10.0000 mL | Freq: Once | INTRAVENOUS | Status: DC | PRN
  Filled 2018-06-18: qty 10

## 2018-06-18 NOTE — Patient Instructions (Signed)

## 2018-07-23 ENCOUNTER — Ambulatory Visit (INDEPENDENT_AMBULATORY_CARE_PROVIDER_SITE_OTHER): Payer: BLUE CROSS/BLUE SHIELD | Admitting: Vascular Surgery

## 2018-07-30 ENCOUNTER — Other Ambulatory Visit: Payer: Self-pay

## 2018-07-30 ENCOUNTER — Inpatient Hospital Stay: Payer: BLUE CROSS/BLUE SHIELD | Attending: Hematology and Oncology

## 2018-07-30 DIAGNOSIS — D509 Iron deficiency anemia, unspecified: Secondary | ICD-10-CM

## 2018-07-30 DIAGNOSIS — K9589 Other complications of other bariatric procedure: Secondary | ICD-10-CM

## 2018-07-30 LAB — CBC WITH DIFFERENTIAL/PLATELET
Abs Immature Granulocytes: 0.03 10*3/uL (ref 0.00–0.07)
Basophils Absolute: 0 10*3/uL (ref 0.0–0.1)
Basophils Relative: 1 %
Eosinophils Absolute: 0.2 10*3/uL (ref 0.0–0.5)
Eosinophils Relative: 3 %
HCT: 33.6 % — ABNORMAL LOW (ref 39.0–52.0)
Hemoglobin: 11 g/dL — ABNORMAL LOW (ref 13.0–17.0)
Immature Granulocytes: 0 %
Lymphocytes Relative: 29 %
Lymphs Abs: 2.2 10*3/uL (ref 0.7–4.0)
MCH: 29 pg (ref 26.0–34.0)
MCHC: 32.7 g/dL (ref 30.0–36.0)
MCV: 88.7 fL (ref 80.0–100.0)
Monocytes Absolute: 0.7 10*3/uL (ref 0.1–1.0)
Monocytes Relative: 10 %
Neutro Abs: 4.4 10*3/uL (ref 1.7–7.7)
Neutrophils Relative %: 57 %
Platelets: 206 10*3/uL (ref 150–400)
RBC: 3.79 MIL/uL — ABNORMAL LOW (ref 4.22–5.81)
RDW: 13.4 % (ref 11.5–15.5)
WBC: 7.6 10*3/uL (ref 4.0–10.5)
nRBC: 0 % (ref 0.0–0.2)

## 2018-07-30 LAB — FERRITIN: Ferritin: 60 ng/mL (ref 24–336)

## 2018-08-17 DIAGNOSIS — M25473 Effusion, unspecified ankle: Secondary | ICD-10-CM | POA: Insufficient documentation

## 2018-08-23 DIAGNOSIS — M76829 Posterior tibial tendinitis, unspecified leg: Secondary | ICD-10-CM | POA: Insufficient documentation

## 2018-08-23 DIAGNOSIS — M19079 Primary osteoarthritis, unspecified ankle and foot: Secondary | ICD-10-CM | POA: Insufficient documentation

## 2018-08-23 DIAGNOSIS — G8929 Other chronic pain: Secondary | ICD-10-CM | POA: Insufficient documentation

## 2018-08-23 DIAGNOSIS — R600 Localized edema: Secondary | ICD-10-CM | POA: Insufficient documentation

## 2018-08-23 DIAGNOSIS — M25579 Pain in unspecified ankle and joints of unspecified foot: Secondary | ICD-10-CM | POA: Insufficient documentation

## 2018-08-23 DIAGNOSIS — M1990 Unspecified osteoarthritis, unspecified site: Secondary | ICD-10-CM | POA: Insufficient documentation

## 2018-09-06 ENCOUNTER — Inpatient Hospital Stay: Payer: BLUE CROSS/BLUE SHIELD

## 2018-09-06 ENCOUNTER — Other Ambulatory Visit: Payer: Self-pay

## 2018-09-06 DIAGNOSIS — Z87891 Personal history of nicotine dependence: Secondary | ICD-10-CM | POA: Insufficient documentation

## 2018-09-06 DIAGNOSIS — R600 Localized edema: Secondary | ICD-10-CM | POA: Diagnosis not present

## 2018-09-06 DIAGNOSIS — M2550 Pain in unspecified joint: Secondary | ICD-10-CM | POA: Diagnosis not present

## 2018-09-06 DIAGNOSIS — R079 Chest pain, unspecified: Secondary | ICD-10-CM | POA: Insufficient documentation

## 2018-09-06 DIAGNOSIS — E538 Deficiency of other specified B group vitamins: Secondary | ICD-10-CM | POA: Diagnosis not present

## 2018-09-06 DIAGNOSIS — D509 Iron deficiency anemia, unspecified: Secondary | ICD-10-CM | POA: Diagnosis present

## 2018-09-06 DIAGNOSIS — Z9884 Bariatric surgery status: Secondary | ICD-10-CM | POA: Diagnosis not present

## 2018-09-06 DIAGNOSIS — D508 Other iron deficiency anemias: Secondary | ICD-10-CM

## 2018-09-06 LAB — CBC WITH DIFFERENTIAL/PLATELET
Abs Immature Granulocytes: 0.04 10*3/uL (ref 0.00–0.07)
Basophils Absolute: 0 10*3/uL (ref 0.0–0.1)
Basophils Relative: 1 %
Eosinophils Absolute: 0.2 10*3/uL (ref 0.0–0.5)
Eosinophils Relative: 2 %
HCT: 32.7 % — ABNORMAL LOW (ref 39.0–52.0)
Hemoglobin: 10.8 g/dL — ABNORMAL LOW (ref 13.0–17.0)
Immature Granulocytes: 1 %
Lymphocytes Relative: 26 %
Lymphs Abs: 2.2 10*3/uL (ref 0.7–4.0)
MCH: 29.4 pg (ref 26.0–34.0)
MCHC: 33 g/dL (ref 30.0–36.0)
MCV: 89.1 fL (ref 80.0–100.0)
Monocytes Absolute: 0.8 10*3/uL (ref 0.1–1.0)
Monocytes Relative: 9 %
Neutro Abs: 5.3 10*3/uL (ref 1.7–7.7)
Neutrophils Relative %: 61 %
Platelets: 206 10*3/uL (ref 150–400)
RBC: 3.67 MIL/uL — ABNORMAL LOW (ref 4.22–5.81)
RDW: 13.6 % (ref 11.5–15.5)
WBC: 8.5 10*3/uL (ref 4.0–10.5)
nRBC: 0 % (ref 0.0–0.2)

## 2018-09-06 LAB — FERRITIN: Ferritin: 23 ng/mL — ABNORMAL LOW (ref 24–336)

## 2018-09-07 NOTE — Progress Notes (Signed)
[facility]  [address], [address] [place], [place] [address] Phone: [phone]  Fax: [phone]   Clinic Day:  09/08/2018  Referring physician: Tracie Harrier, MD  Chief Complaint: Spencer Herring is a 63 y.o. male s/p gastric bypass surgery and iron deficiency anemia who is seen for 3 month assessment  HPI: The patient was last seen in the hematology clinic on 06/08/2018. At that time, he was fatigued. He denied any melena, hematochezia or hematuria. Stool was dark on oral iron. Hemoglobin was 11.8.  Ferritin was 16. He received IV Feraheme on 06/11/2018 and 06/18/2018.  Urinalysis on 05/14/2018 was negative for hemoglobin and otherwise unremarkable.  CBCs followed: 07/30/2018: WBC 7,600, hemoglobin 11.0, hematocrit 33.6. Ferritin was 60.  09/06/2018: WBC 8,500, hemoglobin 10.8, hematocrit 32.7. Ferritin was 23.   During the interim, he has been "fair." He is fatigued and has had no energy today.  He reports chest pain induced by stress. He denies blood in his stools or urine. He has arthritis and pain in his feet and legs. Imaging of his legs was recently done at [facility].   He takes an oral B12 supplement daily, since his surgery in 2014. He gets a B12 injection about every 6 months when he sees his PCP. His last injection was in 04/2018.   He states that [facility] is no longer covered by his insurance, and he is in need of a new GI doctor.  He previously saw Dr. Arther Dames.   Past Medical History:  Diagnosis Date  . Anemia   . Asthma   . COPD (chronic obstructive pulmonary disease) ([place])   . Morbid obesity ([place])   . Neuromuscular disorder ([place])   . Sleep apnea     Past Surgical History:  Procedure Laterality Date  . BARIATRIC SURGERY N/A   . COLONOSCOPY N/A 04/02/2015   Procedure: COLONOSCOPY;  Surgeon: Josefine Class, MD;  Location: [facility] ENDOSCOPY;  Service: Endoscopy;  Laterality: N/A;  . COLONOSCOPY WITH PROPOFOL N/A  12/01/2014   Procedure: COLONOSCOPY WITH PROPOFOL;  Surgeon: Josefine Class, MD;  Location: [facility] ENDOSCOPY;  Service: Endoscopy;  Laterality: N/A;  . CORONARY STENT INTERVENTION N/A 12/01/2017   Procedure: CORONARY STENT INTERVENTION;  Surgeon: Isaias Cowman, MD;  Location: [place] CV LAB;  Service: Cardiovascular;  Laterality: N/A;  . LEFT HEART CATH AND CORONARY ANGIOGRAPHY Left 12/01/2017   Procedure: LEFT HEART CATH AND CORONARY ANGIOGRAPHY;  Surgeon: Corey Skains, MD;  Location: [place] [facility];  Service: Cardiovascular;  Laterality: Left;  . LOWER EXTREMITY ANGIOGRAPHY Right 12/17/2017   Procedure: LOWER EXTREMITY ANGIOGRAPHY;  Surgeon: Algernon Huxley, MD;  Location: [place] [facility];  Service: Cardiovascular;  Laterality: Right;  . PNEUMONECTOMY Left     Family History  Problem Relation Age of Onset  . Stroke Mother   . Heart disease Father   . Heart disease Paternal Grandfather     Social History:  reports that he quit smoking about 19 years ago. His smoking use included cigarettes. He has never used smokeless tobacco. He reports that he does not drink alcohol or use drugs. Former smoker; quit x 19 years ago. He does not use alcohol. Employed full time, as a Librarian, academic, in a Writer. He lives in [place].  The patient is alone today.  Allergies:  Allergies  Allergen Reactions  . Latex Rash    Current Medications: Current Outpatient Medications  Medication Sig Dispense Refill  . albuterol (ACCUNEB)  0.63 MG/3ML nebulizer solution Take 1 ampule by nebulization every 4 (four) hours as needed for wheezing.    Marland Kitchen albuterol (VENTOLIN HFA) 108 (90 Base) MCG/ACT inhaler Ventolin HFA 90 mcg/actuation aerosol inhaler  INHALE 2 INHALATIONS INTO THE LUNGS EVERY 4 (FOUR) HOURS AS NEEDED FOR WHEEZING    . ALPRAZolam (XANAX) 0.25 MG tablet Take 0.25 mg by mouth at bedtime as needed for anxiety.    Marland Kitchen amLODipine (NORVASC) 2.5 MG tablet Take 1 tablet (2.5 mg total) by  mouth daily. 90 tablet 4  . ascorbic acid (VITAMIN C) 250 MG tablet Take 250 mg by mouth daily.    Marland Kitchen aspirin 81 MG chewable tablet Chew 1 tablet (81 mg total) by mouth daily. 90 tablet 4  . atorvastatin (LIPITOR) 40 MG tablet Take 1 tablet (40 mg total) by mouth daily. 90 tablet 4  . budesonide-formoterol (SYMBICORT) 160-4.5 MCG/ACT inhaler Inhale 2 puffs into the lungs daily as needed (SHORTNESS OF BREATH).     Marland Kitchen cyclobenzaprine (FLEXERIL) 5 MG tablet Take 5 mg by mouth as needed.    . ferrous sulfate 325 (65 FE) MG tablet Take 325 mg by mouth 2 (two) times daily with a meal.     . gabapentin (NEURONTIN) 300 MG capsule Take 300 mg by mouth at bedtime.    Marland Kitchen losartan (COZAAR) 25 MG tablet Take 1 tablet by mouth daily.    . Multiple Vitamin (MULTIVITAMIN) tablet Take 1 tablet by mouth daily.    . nitroGLYCERIN (NITROSTAT) 0.4 MG SL tablet Place 0.4 mg under the tongue every 5 (five) minutes as needed.     Marland Kitchen oxyCODONE-acetaminophen (PERCOCET) 7.5-325 MG per tablet Take 1 tablet by mouth 2 (two) times daily as needed for severe pain.    . potassium chloride (KLOR-CON M10) 10 MEQ tablet Take 10 mEq by mouth daily.     . traMADol (ULTRAM) 50 MG tablet Take 50 mg by mouth every 12 (twelve) hours as needed for moderate pain.     No current facility-administered medications for this visit.     Review of Systems  Constitutional: Positive for weight loss (s/p gastric bypass surgery; up 1 pound). Negative for chills, diaphoresis, fever and malaise/fatigue.       Feels "fair".  No energy.  HENT: Negative for congestion, hearing loss, nosebleeds and sore throat.   Eyes: Negative for blurred vision and double vision.  Respiratory: Negative for cough, shortness of breath and wheezing.   Cardiovascular: Positive for chest pain (stress induced). Negative for palpitations, orthopnea, leg swelling and PND.       S/p cardiac stent  Gastrointestinal: Negative for abdominal pain, blood in stool, constipation,  diarrhea, heartburn, melena, nausea and vomiting.  Genitourinary: Negative for dysuria, frequency, hematuria and urgency.  Musculoskeletal: Positive for joint pain (feet and legs). Negative for myalgias.       Arthritis.  Feet and legs hurt continuously  Skin: Negative for itching and rash.  Neurological: Positive for headaches. Negative for dizziness, tingling, sensory change and weakness.  Endo/Heme/Allergies: Bruises/bleeds easily (bruises easily on arms).  Psychiatric/Behavioral: Negative for depression and memory loss. The patient is not nervous/anxious and does not have insomnia.    Performance status (ECOG): 1  Physical Exam  Constitutional: He is oriented to person, place, and time. He appears well-developed and well-nourished. No distress.  HENT:  Head: Normocephalic and atraumatic.  Mouth/Throat: Oropharynx is clear and moist.  Gray hair.  Mask.  Eyes: Pupils are equal, round, and reactive to light. Conjunctivae  and EOM are normal. No scleral icterus.  Glasses.  Blue eyes.  Neck: Normal range of motion. Neck supple. No JVD present.  Cardiovascular: Normal rate, regular rhythm and normal heart sounds. Exam reveals no gallop and no friction rub.  No murmur heard. Pulmonary/Chest: Effort normal and breath sounds normal. No respiratory distress. He has no wheezes. He has no rales.  Abdominal: Soft. Bowel sounds are normal. He exhibits no distension and no mass. There is no abdominal tenderness. There is no rebound and no guarding.  Umbilical hernia.  Musculoskeletal: Normal range of motion.        General: Edema (1+ right lower extremity edema below knee) present.  Lymphadenopathy:    He has no cervical adenopathy.  Neurological: He is alert and oriented to person, place, and time.  Skin: Skin is warm. No rash noted. He is not diaphoretic. No erythema. No pallor.  Upper extremity ecchymosis.  Psychiatric: He has a normal mood and affect. His behavior is normal. Judgment and  thought content normal.  Nursing note and vitals reviewed.    Appointment on 09/06/2018  Component Date Value Ref Range Status  . Ferritin 09/06/2018 23* 24 - 336 ng/mL Final   Performed at [facility], [address] [place], [place] [address]  . WBC 09/06/2018 8.5  4.0 - 10.5 K/uL Final  . RBC 09/06/2018 3.67* 4.22 - 5.81 MIL/uL Final  . Hemoglobin 09/06/2018 10.8* 13.0 - 17.0 g/dL Final  . HCT 09/06/2018 32.7* 39.0 - 52.0 % Final  . MCV 09/06/2018 89.1  80.0 - 100.0 fL Final  . MCH 09/06/2018 29.4  26.0 - 34.0 pg Final  . MCHC 09/06/2018 33.0  30.0 - 36.0 g/dL Final  . RDW 09/06/2018 13.6  11.5 - 15.5 % Final  . Platelets 09/06/2018 206  150 - 400 K/uL Final  . nRBC 09/06/2018 0.0  0.0 - 0.2 % Final  . Neutrophils Relative % 09/06/2018 61  % Final  . Neutro Abs 09/06/2018 5.3  1.7 - 7.7 K/uL Final  . Lymphocytes Relative 09/06/2018 26  % Final  . Lymphs Abs 09/06/2018 2.2  0.7 - 4.0 K/uL Final  . Monocytes Relative 09/06/2018 9  % Final  . Monocytes Absolute 09/06/2018 0.8  0.1 - 1.0 K/uL Final  . Eosinophils Relative 09/06/2018 2  % Final  . Eosinophils Absolute 09/06/2018 0.2  0.0 - 0.5 K/uL Final  . Basophils Relative 09/06/2018 1  % Final  . Basophils Absolute 09/06/2018 0.0  0.0 - 0.1 K/uL Final  . Immature Granulocytes 09/06/2018 1  % Final  . Abs Immature Granulocytes 09/06/2018 0.04  0.00 - 0.07 K/uL Final   Performed at [facility], [address] [place], [place] [address]    Assessment:  Spencer Herring is a 63 y.o. male with iron deficiency anemia secondary to gastric bypass surgery.  He states that his anemia started before his surgery.  Diet is good.  Labs on 05/14/2018 revealed a hematocrit of 35.7, hemoglobin 11.4, MCV 89.3, platelets 201,000, WBC 7300 with an ANC of 4690.  Normal studies included: creatinine, albumen, protein, LFTs, and TSH.  Urinalysis revealed no hematuria.  He has received Feraheme:  06/11/2018 and 06/18/2018.   Ferritin has been followed:  20 on 06/30/2014, 16 on 11/03/2014, 37 on 02/28/2015, 22 on 12/28/2015, 19 on 02/13/2017, 16 on 06/08/2018, 60 on 07/30/2018, and 23 on 09/06/2018.   EGD on 01/04/2013 revealed a normal esophagus with non-bleeding erosive gastropathy.  Gastric remnant biopsies  revealed focal superficial vascular congestion and increase in parietal cell size with atypical protrusions.  There was no dysplasia or malignancy.  Colonoscopy on 12/01/2014 revealed 2 ulcers at the hepatic flexure. There was diverticulosis in the sigmoid colon.  Pathology from the ulcers at the hepatic flexure revealed focally inflamed with ischemia and reactive epithelial atypia.  There were separate fragment of fibrinopurulent debris c/w ulceration.  There was no malignancy.  Additional biopsies were negative. Colonoscopy on 04/02/2015 revealed diverticulosis in the sigmoid colon.  Ulcers were no longer present.  Repeat colonoscopy is planned for 5 years.  Guaiac cards were negative on 01/09/2016 and 10/24/2016.  Urinalysis on 05/14/2018 revealed no hematuria.  Symptomatically, he notes "no energy".  He denies any melena, hematochezia or hematuria.  Exam is unremarkable.  Plan: 1.   Review labs from 09/06/2018. 2.   Iron deficiency anemia                Patient comments that his anemia started before his gastric bypass surgery.  He is s/p gastric bypass surgery with poor absorption of oral ion.             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             Urinalysis in 04/2018 revealed no hematuria.             He denies any bleeding.             Ferritin improved after IV iron then subsequently declined.  Hemoglobin continues to drift down.             Discuss plan for repeat Feraheme weekly x 2.               Discuss GI consult.  Patient would like to see someone in [place]. 3.   B12 deficiency  Patient has been on oral B12.  He receives B12 injections when he  sees Dr Ginette Pitman every 6 months (last 04/2018).  Check B12 level today. 4.   Feraheme today and next week. 5.   Consult Dr Allen Norris. 6.   RTC in 3 months for MD assessment, labs (CBC with diff, ferritin- day before) and +/- Feraheme.  I discussed the assessment and treatment plan with the patient.  The patient was provided an opportunity to ask questions and all were answered.  The patient agreed with the plan and demonstrated an understanding of the instructions.  The patient was advised to call back if the symptoms worsen or if the condition fails to improve as anticipated.  I provided 25 minutes of face-to-face time during this this encounter and > 50% was spent counseling as documented under my assessment and plan.    Lequita Asal, MD, PhD    09/08/2018, 1:30 PM  I, Molly Dorshimer, am acting as Education administrator for [employer]. Mike Gip, MD, PhD.  I, Melissa C. Mike Gip, MD, have reviewed the above documentation for accuracy and completeness, and I agree with the above.

## 2018-09-08 ENCOUNTER — Other Ambulatory Visit: Payer: Self-pay

## 2018-09-08 ENCOUNTER — Encounter: Payer: Self-pay | Admitting: Hematology and Oncology

## 2018-09-08 ENCOUNTER — Inpatient Hospital Stay: Payer: BLUE CROSS/BLUE SHIELD

## 2018-09-08 ENCOUNTER — Inpatient Hospital Stay: Payer: BLUE CROSS/BLUE SHIELD | Attending: Hematology and Oncology | Admitting: Hematology and Oncology

## 2018-09-08 VITALS — BP 116/69 | HR 60 | Temp 97.2°F | Resp 18 | Wt 223.1 lb

## 2018-09-08 VITALS — BP 116/69 | HR 60 | Resp 18

## 2018-09-08 DIAGNOSIS — D509 Iron deficiency anemia, unspecified: Secondary | ICD-10-CM

## 2018-09-08 DIAGNOSIS — M2550 Pain in unspecified joint: Secondary | ICD-10-CM

## 2018-09-08 DIAGNOSIS — D508 Other iron deficiency anemias: Secondary | ICD-10-CM

## 2018-09-08 DIAGNOSIS — E538 Deficiency of other specified B group vitamins: Secondary | ICD-10-CM | POA: Diagnosis not present

## 2018-09-08 DIAGNOSIS — Z9884 Bariatric surgery status: Secondary | ICD-10-CM

## 2018-09-08 DIAGNOSIS — Z87891 Personal history of nicotine dependence: Secondary | ICD-10-CM

## 2018-09-08 DIAGNOSIS — K9589 Other complications of other bariatric procedure: Secondary | ICD-10-CM

## 2018-09-08 DIAGNOSIS — R079 Chest pain, unspecified: Secondary | ICD-10-CM

## 2018-09-08 DIAGNOSIS — R600 Localized edema: Secondary | ICD-10-CM

## 2018-09-08 LAB — VITAMIN B12: Vitamin B-12: 1026 pg/mL — ABNORMAL HIGH (ref 180–914)

## 2018-09-08 MED ORDER — SODIUM CHLORIDE 0.9 % IV SOLN
510.0000 mg | Freq: Once | INTRAVENOUS | Status: AC
  Administered 2018-09-08: 510 mg via INTRAVENOUS
  Filled 2018-09-08: qty 17

## 2018-09-08 MED ORDER — SODIUM CHLORIDE 0.9 % IV SOLN
Freq: Once | INTRAVENOUS | Status: AC
  Administered 2018-09-08: 14:00:00 via INTRAVENOUS
  Filled 2018-09-08: qty 250

## 2018-09-08 NOTE — Progress Notes (Signed)
Pt here for follow up. Denies any concerns at this time.  

## 2018-09-08 NOTE — Patient Instructions (Signed)

## 2018-09-09 ENCOUNTER — Other Ambulatory Visit: Payer: BLUE CROSS/BLUE SHIELD

## 2018-09-10 ENCOUNTER — Ambulatory Visit: Payer: BLUE CROSS/BLUE SHIELD

## 2018-09-10 ENCOUNTER — Ambulatory Visit: Payer: BLUE CROSS/BLUE SHIELD | Admitting: Hematology and Oncology

## 2018-09-13 ENCOUNTER — Other Ambulatory Visit: Payer: Self-pay | Admitting: Hematology and Oncology

## 2018-09-13 DIAGNOSIS — K633 Ulcer of intestine: Secondary | ICD-10-CM

## 2018-09-13 DIAGNOSIS — D509 Iron deficiency anemia, unspecified: Secondary | ICD-10-CM

## 2018-09-15 ENCOUNTER — Inpatient Hospital Stay: Payer: BLUE CROSS/BLUE SHIELD

## 2018-09-28 ENCOUNTER — Encounter: Payer: Self-pay | Admitting: Hematology and Oncology

## 2018-10-22 ENCOUNTER — Other Ambulatory Visit
Admission: RE | Admit: 2018-10-22 | Discharge: 2018-10-22 | Disposition: A | Discharge status: Home / Self Care | Payer: BLUE CROSS/BLUE SHIELD | Source: Ambulatory Visit | Attending: Surgery | Admitting: Surgery

## 2018-10-22 ENCOUNTER — Other Ambulatory Visit: Payer: Self-pay

## 2018-10-22 DIAGNOSIS — Z1159 Encounter for screening for other viral diseases: Secondary | ICD-10-CM | POA: Diagnosis present

## 2018-10-23 LAB — NOVEL CORONAVIRUS, NAA (HOSP ORDER, SEND-OUT TO REF LAB; TAT 18-24 HRS): SARS-CoV-2, NAA: NOT DETECTED

## 2018-10-26 ENCOUNTER — Encounter: Payer: Self-pay | Admitting: Emergency Medicine

## 2018-10-27 ENCOUNTER — Other Ambulatory Visit: Payer: Self-pay

## 2018-10-27 ENCOUNTER — Encounter
Admission: RE | Disposition: A | Discharge status: Home / Self Care | Payer: Self-pay | Source: Home / Self Care | Attending: Internal Medicine

## 2018-10-27 ENCOUNTER — Ambulatory Visit
Admission: RE | Admit: 2018-10-27 | Discharge: 2018-10-27 | Disposition: A | Discharge status: Home / Self Care | Payer: BLUE CROSS/BLUE SHIELD | Attending: Internal Medicine | Admitting: Internal Medicine

## 2018-10-27 ENCOUNTER — Encounter: Payer: Self-pay | Admitting: Emergency Medicine

## 2018-10-27 ENCOUNTER — Ambulatory Visit: Payer: BLUE CROSS/BLUE SHIELD | Admitting: Anesthesiology

## 2018-10-27 DIAGNOSIS — Z955 Presence of coronary angioplasty implant and graft: Secondary | ICD-10-CM | POA: Insufficient documentation

## 2018-10-27 DIAGNOSIS — D128 Benign neoplasm of rectum: Secondary | ICD-10-CM | POA: Insufficient documentation

## 2018-10-27 DIAGNOSIS — K449 Diaphragmatic hernia without obstruction or gangrene: Secondary | ICD-10-CM | POA: Diagnosis not present

## 2018-10-27 DIAGNOSIS — K635 Polyp of colon: Secondary | ICD-10-CM | POA: Diagnosis not present

## 2018-10-27 DIAGNOSIS — K5730 Diverticulosis of large intestine without perforation or abscess without bleeding: Secondary | ICD-10-CM | POA: Insufficient documentation

## 2018-10-27 DIAGNOSIS — I2510 Atherosclerotic heart disease of native coronary artery without angina pectoris: Secondary | ICD-10-CM | POA: Diagnosis not present

## 2018-10-27 DIAGNOSIS — K2970 Gastritis, unspecified, without bleeding: Secondary | ICD-10-CM | POA: Insufficient documentation

## 2018-10-27 DIAGNOSIS — Z87891 Personal history of nicotine dependence: Secondary | ICD-10-CM | POA: Diagnosis not present

## 2018-10-27 DIAGNOSIS — D509 Iron deficiency anemia, unspecified: Secondary | ICD-10-CM | POA: Diagnosis present

## 2018-10-27 DIAGNOSIS — R1310 Dysphagia, unspecified: Secondary | ICD-10-CM | POA: Diagnosis not present

## 2018-10-27 DIAGNOSIS — Z79899 Other long term (current) drug therapy: Secondary | ICD-10-CM | POA: Diagnosis not present

## 2018-10-27 DIAGNOSIS — Z9884 Bariatric surgery status: Secondary | ICD-10-CM | POA: Insufficient documentation

## 2018-10-27 DIAGNOSIS — I10 Essential (primary) hypertension: Secondary | ICD-10-CM | POA: Diagnosis not present

## 2018-10-27 DIAGNOSIS — K259 Gastric ulcer, unspecified as acute or chronic, without hemorrhage or perforation: Secondary | ICD-10-CM | POA: Diagnosis not present

## 2018-10-27 DIAGNOSIS — D500 Iron deficiency anemia secondary to blood loss (chronic): Secondary | ICD-10-CM | POA: Diagnosis not present

## 2018-10-27 DIAGNOSIS — K219 Gastro-esophageal reflux disease without esophagitis: Secondary | ICD-10-CM | POA: Diagnosis not present

## 2018-10-27 DIAGNOSIS — Z7982 Long term (current) use of aspirin: Secondary | ICD-10-CM | POA: Insufficient documentation

## 2018-10-27 DIAGNOSIS — K640 First degree hemorrhoids: Secondary | ICD-10-CM | POA: Insufficient documentation

## 2018-10-27 DIAGNOSIS — J449 Chronic obstructive pulmonary disease, unspecified: Secondary | ICD-10-CM | POA: Diagnosis not present

## 2018-10-27 DIAGNOSIS — G4730 Sleep apnea, unspecified: Secondary | ICD-10-CM | POA: Insufficient documentation

## 2018-10-27 HISTORY — PX: ESOPHAGOGASTRODUODENOSCOPY (EGD) WITH PROPOFOL: SHX5813

## 2018-10-27 HISTORY — PX: COLONOSCOPY WITH PROPOFOL: SHX5780

## 2018-10-27 SURGERY — COLONOSCOPY WITH PROPOFOL
Anesthesia: General

## 2018-10-27 MED ORDER — PROPOFOL 10 MG/ML IV BOLUS
INTRAVENOUS | Status: DC | PRN
  Administered 2018-10-27: 90 mg via INTRAVENOUS

## 2018-10-27 MED ORDER — MIDAZOLAM HCL 2 MG/2ML IJ SOLN
INTRAMUSCULAR | Status: AC
  Filled 2018-10-27: qty 2

## 2018-10-27 MED ORDER — PROPOFOL 500 MG/50ML IV EMUL
INTRAVENOUS | Status: DC | PRN
  Administered 2018-10-27: 120 ug/kg/min via INTRAVENOUS

## 2018-10-27 MED ORDER — MIDAZOLAM HCL 2 MG/2ML IJ SOLN
INTRAMUSCULAR | Status: DC | PRN
  Administered 2018-10-27: 2 mg via INTRAVENOUS

## 2018-10-27 MED ORDER — SODIUM CHLORIDE 0.9 % IV SOLN
INTRAVENOUS | Status: DC
  Administered 2018-10-27: 1000 mL via INTRAVENOUS

## 2018-10-27 MED ORDER — PROPOFOL 500 MG/50ML IV EMUL
INTRAVENOUS | Status: AC
  Filled 2018-10-27: qty 50

## 2018-10-27 NOTE — Op Note (Signed)
[facility] Gastroenterology Patient Name: Spencer Herring Procedure Date: 10/27/2018 8:26 AM MRN: [id number] Account #: [id number] Date of Birth: [birth year] Admit Type: Outpatient Age: 63 Room: [place] ENDO ROOM 2 Gender: Male Note Status: Finalized Procedure:            Upper GI endoscopy Indications:          Iron deficiency anemia secondary to chronic blood loss,                        Dysphagia Providers:            Benay Pike.  MD, MD Medicines:            Propofol per Anesthesia Complications:        No immediate complications. Procedure:            Pre-Anesthesia Assessment:                       - The risks and benefits of the procedure and the                        sedation options and risks were discussed with the                        patient. All questions were answered and informed                        consent was obtained.                       - Patient identification and proposed procedure were                        verified prior to the procedure by the nurse. The                        procedure was verified in the procedure room.                       - ASA Grade Assessment: III - A patient with severe                        systemic disease.                       - After reviewing the risks and benefits, the patient                        was deemed in satisfactory condition to undergo the                        procedure.                       After obtaining informed consent, the endoscope was                        passed under direct vision. Throughout the procedure,                        the patient's blood pressure, pulse, and oxygen  saturations were monitored continuously. The Endoscope                        was introduced through the mouth, and advanced to the                        third part of duodenum. The upper GI endoscopy was                        accomplished without difficulty. The patient  tolerated                        the procedure well. Findings:      No endoscopic abnormality was evident in the esophagus to explain the       patient's complaint of dysphagia.      A 2 cm hiatal hernia was present.      There is no endoscopic evidence of stenosis, stricture, signs of       external compression or lower esophageal sphincter tone abnormalities in       the entire esophagus.      Patchy moderate inflammation characterized by congestion (edema),       erosions and erythema was found in the gastric antrum. Biopsies were       taken with a cold forceps for Helicobacter pylori testing.      One non-bleeding superficial gastric ulcer with no stigmata of bleeding       was found in the gastric antrum. The lesion was 7 mm in largest       dimension.      One non-bleeding superficial gastric ulcer with no stigmata of bleeding       was found at the pylorus. The lesion was 5 mm in largest dimension.      Evidence of a sleeve gastrectomy was found in the cardia, in the gastric       body and in the gastric antrum. This was characterized by healthy       appearing mucosa.      The exam was otherwise without abnormality. Impression:           - No endoscopic esophageal abnormality to explain                        patient's dysphagia.                       - 2 cm hiatal hernia.                       - Gastritis. Biopsied.                       - Non-bleeding gastric ulcer with no stigmata of                        bleeding.                       - Non-bleeding gastric ulcer with no stigmata of                        bleeding.                       -  A sleeve gastrectomy was found, characterized by                        healthy appearing mucosa.                       - The examination was otherwise normal. Recommendation:       - Await pathology results.                       - Proceed with colonoscopy Procedure Code(s):    --- Professional ---                       [phone],  Esophagogastroduodenoscopy, flexible, transoral;                        with biopsy, single or multiple Diagnosis Code(s):    --- Professional ---                       D50.0, Iron deficiency anemia secondary to blood loss                        (chronic)                       Z98.84, Bariatric surgery status                       K25.9, Gastric ulcer, unspecified as acute or chronic,                        without hemorrhage or perforation                       K29.70, Gastritis, unspecified, without bleeding                       K44.9, Diaphragmatic hernia without obstruction or                        gangrene                       R13.10, Dysphagia, unspecified CPT copyright 2019 American Medical Association. All rights reserved. The codes documented in this report are preliminary and upon coder review may  be revised to meet current compliance requirements. Efrain Sella MD, MD 10/27/2018 8:51:27 AM This report has been signed electronically. Number of Addenda: 0 Note Initiated On: 10/27/2018 8:26 AM      [facility]

## 2018-10-27 NOTE — Anesthesia Post-op Follow-up Note (Signed)
Anesthesia QCDR form completed.        

## 2018-10-27 NOTE — Anesthesia Postprocedure Evaluation (Signed)
Anesthesia Post Note  Patient: Spencer Herring  Procedure(s) Performed: COLONOSCOPY WITH PROPOFOL (N/A ) ESOPHAGOGASTRODUODENOSCOPY (EGD) WITH PROPOFOL (N/A )  Patient location during evaluation: Endoscopy Anesthesia Type: General Level of consciousness: awake and alert Pain management: pain level controlled Vital Signs Assessment: post-procedure vital signs reviewed and stable Respiratory status: spontaneous breathing and respiratory function stable Cardiovascular status: stable Anesthetic complications: no     Last Vitals:  Vitals:   10/27/18 0930 10/27/18 0940  BP: 125/75 133/79  Pulse: (!) 57 (!) 54  Resp: 13 13  Temp:    SpO2: 100% 100%    Last Pain:  Vitals:   10/27/18 0910  TempSrc: Oral  PainSc:                  KEPHART,WILLIAM K

## 2018-10-27 NOTE — Interval H&P Note (Signed)
History and Physical Interval Note:  10/27/2018 8:30 AM  Spencer Herring  has presented today for surgery, with the diagnosis of ANEMIA DYSPHAGIA.  The various methods of treatment have been discussed with the patient and family. After consideration of risks, benefits and other options for treatment, the patient has consented to  Procedure(s): COLONOSCOPY WITH PROPOFOL (N/A) ESOPHAGOGASTRODUODENOSCOPY (EGD) WITH PROPOFOL (N/A) as a surgical intervention.  The patient's history has been reviewed, patient examined, no change in status, stable for surgery.  I have reviewed the patient's chart and labs.  Questions were answered to the patient's satisfaction.     [place], [place]

## 2018-10-27 NOTE — Transfer of Care (Signed)
Immediate Anesthesia Transfer of Care Note  Patient: Spencer Herring  Procedure(s) Performed: COLONOSCOPY WITH PROPOFOL (N/A ) ESOPHAGOGASTRODUODENOSCOPY (EGD) WITH PROPOFOL (N/A )  Patient Location: Endoscopy Unit  Anesthesia Type:General  Level of Consciousness: drowsy and patient cooperative  Airway & Oxygen Therapy: Patient Spontanous Breathing and Patient connected to nasal cannula oxygen  Post-op Assessment: Report given to RN and Post -op Vital signs reviewed and stable  Post vital signs: Reviewed and stable  Last Vitals:  Vitals Value Taken Time  BP 102/70 10/27/18 0916  Temp    Pulse 65 10/27/18 0918  Resp 14 10/27/18 0918  SpO2 100 % 10/27/18 0918  Vitals shown include unvalidated device data.  Last Pain:  Vitals:   10/27/18 0910  TempSrc: Oral  PainSc:          Complications: No apparent anesthesia complications

## 2018-10-27 NOTE — Op Note (Signed)
[facility] Gastroenterology Patient Name: Spencer Herring Procedure Date: 10/27/2018 8:26 AM MRN: [id number] Account #: [id number] Date of Birth: 1955/08/04 Admit Type: Outpatient Age: 63 Room: [facility] ENDO ROOM 2 Gender: Male Note Status: Finalized Procedure:            Colonoscopy Indications:          Iron deficiency anemia secondary to chronic blood loss Providers:            Benay Pike. Tallen Schnorr MD, MD Medicines:            Propofol per Anesthesia Complications:        No immediate complications. Procedure:            Pre-Anesthesia Assessment:                       - The risks and benefits of the procedure and the                        sedation options and risks were discussed with the                        patient. All questions were answered and informed                        consent was obtained.                       - Patient identification and proposed procedure were                        verified prior to the procedure by the nurse. The                        procedure was verified in the procedure room.                       - ASA Grade Assessment: III - A patient with severe                        systemic disease.                       - After reviewing the risks and benefits, the patient                        was deemed in satisfactory condition to undergo the                        procedure.                       After obtaining informed consent, the colonoscope was                        passed under direct vision. Throughout the procedure,                        the patient's blood pressure, pulse, and oxygen                        saturations were monitored continuously. The  Colonoscope was introduced through the anus and                        advanced to the the cecum, identified by appendiceal                        orifice and ileocecal valve. The colonoscopy was                        performed without difficulty.  The patient tolerated the                        procedure well. The quality of the bowel preparation                        was good. The ileocecal valve, appendiceal orifice, and                        rectum were photographed. Findings:      The perianal and digital rectal examinations were normal. Pertinent       negatives include normal sphincter tone and no palpable rectal lesions.      Many small and large-mouthed diverticula were found in the left colon.      A 7 mm polyp was found in the transverse colon. The polyp was       semi-pedunculated. The polyp was removed with a cold snare. Resection       and retrieval were complete.      A 4 mm polyp was found in the rectum. The polyp was sessile. The polyp       was removed with a jumbo cold forceps. Resection and retrieval were       complete.      Non-bleeding internal hemorrhoids were found during retroflexion. The       hemorrhoids were Grade I (internal hemorrhoids that do not prolapse).      A tattoo was seen in the distal ascending colon. The tattoo site       appeared normal.      The exam was otherwise without abnormality. Impression:           - Diverticulosis in the left colon.                       - One 7 mm polyp in the transverse colon, removed with                        a cold snare. Resected and retrieved.                       - One 4 mm polyp in the rectum, removed with a jumbo                        cold forceps. Resected and retrieved.                       - Non-bleeding internal hemorrhoids.                       - A tattoo was seen in the distal ascending colon. The  tattoo site appeared normal.                       - The examination was otherwise normal. Recommendation:       - Await pathology results from EGD, also performed                        today.                       - Patient has a contact number available for                        emergencies. The signs and symptoms of  potential                        delayed complications were discussed with the patient.                        Return to normal activities tomorrow. Written discharge                        instructions were provided to the patient.                       - Resume previous diet.                       - Continue present medications.                       - Await pathology results.                       - Repeat colonoscopy is recommended for surveillance.                        The colonoscopy date will be determined after pathology                        results from today's exam become available for review.                       - Use Protonix (pantoprazole) 40 mg PO daily.                       - Return to physician assistant in 6 weeks.                       - The findings and recommendations were discussed with                        the patient. Procedure Code(s):    --- Professional ---                       [phone], Colonoscopy, flexible; with removal of tumor(s),                        polyp(s), or other lesion(s) by snare technique                       35329, 2, Colonoscopy, flexible; with biopsy,  single                        or multiple Diagnosis Code(s):    --- Professional ---                       K57.30, Diverticulosis of large intestine without                        perforation or abscess without bleeding                       D50.0, Iron deficiency anemia secondary to blood loss                        (chronic)                       K62.1, Rectal polyp                       K63.5, Polyp of colon                       K64.0, First degree hemorrhoids CPT copyright 2019 [employer]. All rights reserved. The codes documented in this report are preliminary and upon coder review may  be revised to meet current compliance requirements. Efrain Sella MD, MD 10/27/2018 9:15:53 AM This report has been signed electronically. Number of Addenda: 0 Note  Initiated On: 10/27/2018 8:26 AM Scope Withdrawal Time: 0 hours 11 minutes 48 seconds  Total Procedure Duration: 0 hours 17 minutes 42 seconds       [facility]

## 2018-10-27 NOTE — Anesthesia Preprocedure Evaluation (Signed)
Anesthesia Evaluation  Patient identified by MRN, date of birth, ID band Patient awake    Reviewed: Allergy & Precautions, NPO status , Patient's Chart, lab work & pertinent test results  History of Anesthesia Complications Negative for: history of anesthetic complications  Airway Mallampati: II       Dental   Pulmonary asthma , sleep apnea and Continuous Positive Airway Pressure Ventilation , COPD,  COPD inhaler, former smoker,           Cardiovascular hypertension, Pt. on medications + CAD and + Cardiac Stents  (-) Past MI and (-) CHF (-) dysrhythmias (-) Valvular Problems/Murmurs     Neuro/Psych neg Seizures    GI/Hepatic Neg liver ROS, GERD  Medicated,  Endo/Other  neg diabetes  Renal/GU negative Renal ROS     Musculoskeletal   Abdominal   Peds  Hematology   Anesthesia Other Findings   Reproductive/Obstetrics                            Anesthesia Physical Anesthesia Plan  ASA: III  Anesthesia Plan: General   Post-op Pain Management:    Induction: Intravenous  PONV Risk Score and Plan: Propofol infusion and TIVA  Airway Management Planned: Nasal Cannula  Additional Equipment:   Intra-op Plan:   Post-operative Plan:   Informed Consent: I have reviewed the patients History and Physical, chart, labs and discussed the procedure including the risks, benefits and alternatives for the proposed anesthesia with the patient or authorized representative who has indicated his/her understanding and acceptance.       Plan Discussed with:   Anesthesia Plan Comments:         Anesthesia Quick Evaluation

## 2018-10-27 NOTE — H&P (Signed)
Outpatient short stay form Pre-procedure 10/27/2018 8:26 AM Spencer Herring Spencer Herring, M.D.  Primary Physician: Tracie Harrier, M.D.  Reason for visit:  Iron deficiency anemia  History of present illness:  Patient who is s/p gastric bypass surgery for obesity in 2014 presents for endoscopic workup of iron deficiency anemia. Patient is receiving IV iron to supplement along with IM and po B12 supplementation. Patient also has some esophageal dysphagia to solid food.    Current Facility-Administered Medications:  .  0.9 %  sodium chloride infusion, , Intravenous, Continuous, [place], Benay Pike, MD, Last Rate: 20 mL/hr at 10/27/18 0759, 1,000 mL at 10/27/18 0759  Medications Prior to Admission  Medication Sig Dispense Refill Last Dose  . albuterol (ACCUNEB) 0.63 MG/3ML nebulizer solution Take 1 ampule by nebulization every 4 (four) hours as needed for wheezing.   Past Week at Unknown time  . albuterol (VENTOLIN HFA) 108 (90 Base) MCG/ACT inhaler Ventolin HFA 90 mcg/actuation aerosol inhaler  INHALE 2 INHALATIONS INTO THE LUNGS EVERY 4 (FOUR) HOURS AS NEEDED FOR WHEEZING   Past Week at Unknown time  . ALPRAZolam (XANAX) 0.25 MG tablet Take 0.25 mg by mouth at bedtime as needed for anxiety.   Past Week at Unknown time  . amLODipine (NORVASC) 2.5 MG tablet Take 1 tablet (2.5 mg total) by mouth daily. 90 tablet 4 10/26/2018 at Unknown time  . aspirin 81 MG chewable tablet Chew 1 tablet (81 mg total) by mouth daily. 90 tablet 4 10/26/2018 at Unknown time  . atorvastatin (LIPITOR) 40 MG tablet Take 1 tablet (40 mg total) by mouth daily. 90 tablet 4 10/26/2018 at Unknown time  . budesonide-formoterol (SYMBICORT) 160-4.5 MCG/ACT inhaler Inhale 2 puffs into the lungs daily as needed (SHORTNESS OF BREATH).    10/27/2018 at Unknown time  . cyclobenzaprine (FLEXERIL) 5 MG tablet Take 5 mg by mouth as needed.   Past Week at Unknown time  . ferrous sulfate 325 (65 FE) MG tablet Take 325 mg by mouth 2 (two) times daily  with a meal.    Past Week at Unknown time  . gabapentin (NEURONTIN) 300 MG capsule Take 300 mg by mouth at bedtime.   10/26/2018 at Unknown time  . Multiple Vitamin (MULTIVITAMIN) tablet Take 1 tablet by mouth daily.   Past Week at Unknown time  . nitroGLYCERIN (NITROSTAT) 0.4 MG SL tablet Place 0.4 mg under the tongue every 5 (five) minutes as needed.    Past Week at Unknown time  . oxyCODONE-acetaminophen (PERCOCET) 7.5-325 MG per tablet Take 1 tablet by mouth 2 (two) times daily as needed for severe pain.   Past Week at Unknown time  . potassium chloride (KLOR-CON M10) 10 MEQ tablet Take 10 mEq by mouth daily.    Past Week at Unknown time  . traMADol (ULTRAM) 50 MG tablet Take 50 mg by mouth every 12 (twelve) hours as needed for moderate pain.   Past Week at Unknown time  . ascorbic acid (VITAMIN C) 250 MG tablet Take 250 mg by mouth daily.     Marland Kitchen losartan (COZAAR) 25 MG tablet Take 1 tablet by mouth daily.   Not Taking at Unknown time     Allergies  Allergen Reactions  . Latex Rash     Past Medical History:  Diagnosis Date  . Anemia   . Asthma   . COPD (chronic obstructive pulmonary disease) ([place])   . Morbid obesity ([place])   . Neuromuscular disorder ([place])   . Sleep apnea  Review of systems:  Otherwise negative.    Physical Exam  Gen: Alert, oriented. Appears stated age.  HEENT: [place]/AT. PERRLA. Lungs: CTA, no wheezes. CV: RR nl S1, S2. Abd: soft, benign, no masses. BS+ Ext: No edema. Pulses 2+    Planned procedures: Proceed with EGD and colonoscopy. The patient understands the nature of the planned procedure, indications, risks, alternatives and potential complications including but not limited to bleeding, infection, perforation, damage to internal organs and possible oversedation/side effects from anesthesia. The patient agrees and gives consent to proceed.  Please refer to procedure notes for findings, recommendations and patient disposition/instructions.      Spencer Herring Spencer Herring, M.D. Gastroenterology 10/27/2018  8:26 AM

## 2018-10-28 ENCOUNTER — Encounter: Payer: Self-pay | Admitting: Internal Medicine

## 2018-10-28 LAB — SURGICAL PATHOLOGY

## 2018-12-16 ENCOUNTER — Inpatient Hospital Stay: Payer: BLUE CROSS/BLUE SHIELD

## 2018-12-16 ENCOUNTER — Other Ambulatory Visit: Payer: Self-pay

## 2018-12-16 DIAGNOSIS — Z6836 Body mass index (BMI) 36.0-36.9, adult: Secondary | ICD-10-CM | POA: Insufficient documentation

## 2018-12-16 DIAGNOSIS — G4730 Sleep apnea, unspecified: Secondary | ICD-10-CM | POA: Diagnosis not present

## 2018-12-16 DIAGNOSIS — E538 Deficiency of other specified B group vitamins: Secondary | ICD-10-CM | POA: Insufficient documentation

## 2018-12-16 DIAGNOSIS — D509 Iron deficiency anemia, unspecified: Secondary | ICD-10-CM | POA: Diagnosis present

## 2018-12-16 DIAGNOSIS — J449 Chronic obstructive pulmonary disease, unspecified: Secondary | ICD-10-CM | POA: Insufficient documentation

## 2018-12-16 DIAGNOSIS — Z87891 Personal history of nicotine dependence: Secondary | ICD-10-CM | POA: Insufficient documentation

## 2018-12-16 DIAGNOSIS — Z9884 Bariatric surgery status: Secondary | ICD-10-CM | POA: Insufficient documentation

## 2018-12-16 DIAGNOSIS — D508 Other iron deficiency anemias: Secondary | ICD-10-CM

## 2018-12-16 DIAGNOSIS — Z7951 Long term (current) use of inhaled steroids: Secondary | ICD-10-CM | POA: Insufficient documentation

## 2018-12-16 DIAGNOSIS — Z7982 Long term (current) use of aspirin: Secondary | ICD-10-CM | POA: Insufficient documentation

## 2018-12-16 LAB — CBC WITH DIFFERENTIAL/PLATELET
Abs Immature Granulocytes: 0.03 10*3/uL (ref 0.00–0.07)
Basophils Absolute: 0.1 10*3/uL (ref 0.0–0.1)
Basophils Relative: 1 %
Eosinophils Absolute: 0.2 10*3/uL (ref 0.0–0.5)
Eosinophils Relative: 2 %
HCT: 35.6 % — ABNORMAL LOW (ref 39.0–52.0)
Hemoglobin: 11.8 g/dL — ABNORMAL LOW (ref 13.0–17.0)
Immature Granulocytes: 0 %
Lymphocytes Relative: 22 %
Lymphs Abs: 1.6 10*3/uL (ref 0.7–4.0)
MCH: 28.4 pg (ref 26.0–34.0)
MCHC: 33.1 g/dL (ref 30.0–36.0)
MCV: 85.8 fL (ref 80.0–100.0)
Monocytes Absolute: 0.6 10*3/uL (ref 0.1–1.0)
Monocytes Relative: 8 %
Neutro Abs: 4.7 10*3/uL (ref 1.7–7.7)
Neutrophils Relative %: 67 %
Platelets: 190 10*3/uL (ref 150–400)
RBC: 4.15 MIL/uL — ABNORMAL LOW (ref 4.22–5.81)
RDW: 14 % (ref 11.5–15.5)
WBC: 7.2 10*3/uL (ref 4.0–10.5)
nRBC: 0 % (ref 0.0–0.2)

## 2018-12-16 LAB — FERRITIN: Ferritin: 19 ng/mL — ABNORMAL LOW (ref 24–336)

## 2018-12-17 ENCOUNTER — Inpatient Hospital Stay: Payer: BLUE CROSS/BLUE SHIELD

## 2018-12-17 ENCOUNTER — Inpatient Hospital Stay: Payer: BLUE CROSS/BLUE SHIELD | Attending: Hematology and Oncology | Admitting: Nurse Practitioner

## 2018-12-17 ENCOUNTER — Encounter: Payer: Self-pay | Admitting: Nurse Practitioner

## 2018-12-17 VITALS — BP 144/80 | HR 53 | Resp 18

## 2018-12-17 VITALS — BP 137/79 | HR 58 | Temp 97.0°F | Resp 16 | Wt 229.9 lb

## 2018-12-17 DIAGNOSIS — D508 Other iron deficiency anemias: Secondary | ICD-10-CM

## 2018-12-17 DIAGNOSIS — D509 Iron deficiency anemia, unspecified: Secondary | ICD-10-CM

## 2018-12-17 DIAGNOSIS — K9589 Other complications of other bariatric procedure: Secondary | ICD-10-CM

## 2018-12-17 MED ORDER — SODIUM CHLORIDE 0.9 % IV SOLN
510.0000 mg | Freq: Once | INTRAVENOUS | Status: AC
  Administered 2018-12-17: 510 mg via INTRAVENOUS
  Filled 2018-12-17: qty 17

## 2018-12-17 MED ORDER — SODIUM CHLORIDE 0.9% FLUSH
3.0000 mL | Freq: Once | INTRAVENOUS | Status: DC | PRN
  Filled 2018-12-17: qty 3

## 2018-12-17 MED ORDER — SODIUM CHLORIDE 0.9 % IV SOLN
Freq: Once | INTRAVENOUS | Status: AC
  Administered 2018-12-17: 14:00:00 via INTRAVENOUS
  Filled 2018-12-17: qty 250

## 2018-12-17 MED ORDER — SODIUM CHLORIDE 0.9% FLUSH
10.0000 mL | Freq: Once | INTRAVENOUS | Status: DC | PRN
  Filled 2018-12-17: qty 10

## 2018-12-17 NOTE — Progress Notes (Signed)
[facility]  [address] [place], [place] [address] Phone: [phone]  Fax: [phone]   Clinic Day:  12/17/2018  Referring physician: Tracie Harrier, MD  Chief Complaint: Spencer Herring is a 63 y.o. male s/p gastric bypass surgery and iron deficiency anemia who returns to clinic today for reevaluation.  HPI: He was last seen by Dr. Mike Gip on 09/08/2018. He reported having no energy at that time. Denied bleeding. Ferritin had improved after IV iron then declined and hemoglobin was drifting down. He received 1 dose of Feraheme on 09/08/2018.  In the interim, he underwent colonoscopy and EGD with Dr. Alice Reichert on 10/27/2018.  No endoscopic abnormality to explain dysphagia, 2 cm hiatal hernia, 1 nonbleeding superficial gastric ulcer with no stigmata of bleeding found in antrum 7 mm in largest dimension, nonbleeding superficial gastric ulcer found at the pylorus.  Colonoscopy showed diverticulosis in the left colon, 7 mm polyp transverse colon which was sessile serrated polyp on pathology, 4 mm polyp in rectum which was tubular adenoma, nonbleeding internal hemorrhoids.  Antral biopsies with foveolar hyperplasia and lamina propria edema.  Negative for H. pylori, dysplasia, or malignancy.   He was started on Protonix 40 mg daily with improvement in abdominal pain.  Small bowel video capsule endoscopy was recommended to rule out small bowel ulcers, neoplasm, AVMs.  Currently awaiting insurance approval for patient.  Today, he complains of similar symptoms including fatigue that does not improve with rest.  Weight gain.  He works 12-hour shifts but then feels too tired to do much more.  Denies bleeding, tarry stools, bleeding gums.  He continues B12 supplementation since gastric bypass surgery and gets injections about every 6 months through his PCP.  CBCs followed: 07/30/2018: WBC 7,600, hemoglobin 11.0, hematocrit 33.6. Ferritin was 60.  09/06/2018: WBC 8,500,  hemoglobin 10.8, hematocrit 32.7. Ferritin was 23.  12/16/2018: WBC 7.2, hemoglobin 11.8, hematocrit 35.6, ferritin 19   Past Medical History:  Diagnosis Date  . Anemia   . Asthma   . COPD (chronic obstructive pulmonary disease) ([place])   . Morbid obesity ([place])   . Neuromuscular disorder ([place])   . Sleep apnea     Past Surgical History:  Procedure Laterality Date  . BARIATRIC SURGERY N/A   . COLONOSCOPY N/A 04/02/2015   Procedure: COLONOSCOPY;  Surgeon: Josefine Class, MD;  Location: [facility] ENDOSCOPY;  Service: Endoscopy;  Laterality: N/A;  . COLONOSCOPY WITH PROPOFOL N/A 12/01/2014   Procedure: COLONOSCOPY WITH PROPOFOL;  Surgeon: Josefine Class, MD;  Location: [facility] ENDOSCOPY;  Service: Endoscopy;  Laterality: N/A;  . COLONOSCOPY WITH PROPOFOL N/A 10/27/2018   Procedure: COLONOSCOPY WITH PROPOFOL;  Surgeon: [place], Benay Pike, MD;  Location: [facility] ENDOSCOPY;  Service: Gastroenterology;  Laterality: N/A;  . CORONARY STENT INTERVENTION N/A 12/01/2017   Procedure: CORONARY STENT INTERVENTION;  Surgeon: Isaias Cowman, MD;  Location: [place] CV LAB;  Service: Cardiovascular;  Laterality: N/A;  . ESOPHAGOGASTRODUODENOSCOPY (EGD) WITH PROPOFOL N/A 10/27/2018   Procedure: ESOPHAGOGASTRODUODENOSCOPY (EGD) WITH PROPOFOL;  Surgeon: [place], Benay Pike, MD;  Location: [facility] ENDOSCOPY;  Service: Gastroenterology;  Laterality: N/A;  . LEFT HEART CATH AND CORONARY ANGIOGRAPHY Left 12/01/2017   Procedure: LEFT HEART CATH AND CORONARY ANGIOGRAPHY;  Surgeon: Corey Skains, MD;  Location: [place] [facility] CV LAB;  Service: Cardiovascular;  Laterality: Left;  . LOWER EXTREMITY ANGIOGRAPHY Right 12/17/2017   Procedure: LOWER EXTREMITY ANGIOGRAPHY;  Surgeon: Algernon Huxley, MD;  Location: [place] CV LAB;  Service: Cardiovascular;  Laterality:  Right;  Marland Kitchen PNEUMONECTOMY Left     Family History  Problem Relation Age of Onset  . Stroke Mother   . Heart disease Father   . Heart disease Paternal  Grandfather     Social History:  reports that he quit smoking about 19 years ago. His smoking use included cigarettes. He has never used smokeless tobacco. He reports that he does not drink alcohol or use drugs. Former smoker; quit x 19 years ago. He does not use alcohol. Employed full time, as a Librarian, academic, in a Writer. He lives in [place].  The patient is alone today.  Allergies:  Allergies  Allergen Reactions  . Latex Rash    Current Medications: Current Outpatient Medications  Medication Sig Dispense Refill  . pantoprazole (PROTONIX) 40 MG tablet Take by mouth.    . telmisartan (MICARDIS) 20 MG tablet TAKE 1 TABLET BY MOUTH EVERY DAY    . torsemide (DEMADEX) 20 MG tablet TAKE 1 TABLET BY MOUTH EVERY DAY    . albuterol (ACCUNEB) 0.63 MG/3ML nebulizer solution Take 1 ampule by nebulization every 4 (four) hours as needed for wheezing.    Marland Kitchen albuterol (VENTOLIN HFA) 108 (90 Base) MCG/ACT inhaler Ventolin HFA 90 mcg/actuation aerosol inhaler  INHALE 2 INHALATIONS INTO THE LUNGS EVERY 4 (FOUR) HOURS AS NEEDED FOR WHEEZING    . ALPRAZolam (XANAX) 0.25 MG tablet Take 0.25 mg by mouth at bedtime as needed for anxiety.    Marland Kitchen amLODipine (NORVASC) 2.5 MG tablet Take 1 tablet (2.5 mg total) by mouth daily. 90 tablet 4  . ascorbic acid (VITAMIN C) 250 MG tablet Take 250 mg by mouth daily.    Marland Kitchen aspirin 81 MG chewable tablet Chew 1 tablet (81 mg total) by mouth daily. 90 tablet 4  . atorvastatin (LIPITOR) 40 MG tablet Take 1 tablet (40 mg total) by mouth daily. 90 tablet 4  . budesonide-formoterol (SYMBICORT) 160-4.5 MCG/ACT inhaler Inhale 2 puffs into the lungs daily as needed (SHORTNESS OF BREATH).     Marland Kitchen cyclobenzaprine (FLEXERIL) 5 MG tablet Take 5 mg by mouth as needed.    . DULoxetine (CYMBALTA) 30 MG capsule Take by mouth daily.    . ferrous sulfate 325 (65 FE) MG tablet Take 325 mg by mouth 2 (two) times daily with a meal.     . gabapentin (NEURONTIN) 300 MG capsule Take 300 mg by mouth at  bedtime.    . isosorbide mononitrate (IMDUR) 30 MG 24 hr tablet     . losartan (COZAAR) 25 MG tablet Take 1 tablet by mouth daily.    . Multiple Vitamin (MULTIVITAMIN) tablet Take 1 tablet by mouth daily.    . nitroGLYCERIN (NITROSTAT) 0.4 MG SL tablet Place 0.4 mg under the tongue every 5 (five) minutes as needed.     Marland Kitchen oxyCODONE-acetaminophen (PERCOCET) 7.5-325 MG per tablet Take 1 tablet by mouth 2 (two) times daily as needed for severe pain.    . potassium chloride (KLOR-CON M10) 10 MEQ tablet Take 10 mEq by mouth daily.     . predniSONE (STERAPRED UNI-PAK 21 TAB) 5 MG (21) TBPK tablet TAKE 6 TABLETS ON DAY 1 AS DIRECTED ON PACKAGE AND DECREASE BY 1 TAB EACH DAY FOR A TOTAL OF 6 DAYS    . tamsulosin (FLOMAX) 0.4 MG CAPS capsule     . traMADol (ULTRAM) 50 MG tablet Take 50 mg by mouth every 12 (twelve) hours as needed for moderate pain.     No current facility-administered medications for  this visit.    Facility-Administered Medications Ordered in Other Visits  Medication Dose Route Frequency Provider Last Rate Last Dose  . sodium chloride flush (NS) 0.9 % injection 10 mL  10 mL Intracatheter Once PRN Karen Kitchens, NP      . sodium chloride flush (NS) 0.9 % injection 3 mL  3 mL Intracatheter Once PRN Karen Kitchens, NP        Review of Systems  Constitutional: Negative for chills, diaphoresis, fever, malaise/fatigue and weight loss (s/p gastric bypass surgery; up 1 pound).       'Tired'. Denies pica.   HENT: Negative for congestion, hearing loss, nosebleeds and sore throat.   Eyes: Negative for blurred vision and double vision.  Respiratory: Negative for cough, shortness of breath and wheezing.   Cardiovascular: Negative for chest pain, palpitations, orthopnea, leg swelling and PND.       Hx of cardiac stent  Gastrointestinal: Negative for abdominal pain, blood in stool, constipation, diarrhea, heartburn, melena, nausea and vomiting.  Genitourinary: Negative for dysuria, frequency,  hematuria and urgency.  Musculoskeletal: Positive for joint pain (feet and legs- chronic). Negative for myalgias.       Arthritis.  Feet and legs hurt continuously  Skin: Negative for itching and rash.  Neurological: Positive for headaches (secondary to stress; none currently). Negative for dizziness, tingling, sensory change and weakness.  Endo/Heme/Allergies: Does not bruise/bleed easily (bruises easily on arms).  Psychiatric/Behavioral: Positive for depression. Negative for memory loss. The patient is not nervous/anxious and does not have insomnia.    Performance status (ECOG): 1  Physical Exam  Constitutional: He is oriented to person, place, and time. He appears well-developed and well-nourished. No distress.  Unaccompanied. Obese. BMI 36  HENT:  Head: Normocephalic and atraumatic.  Mouth/Throat: Oropharynx is clear and moist.  Gray hair.  Mask.  Eyes: Pupils are equal, round, and reactive to light. Conjunctivae and EOM are normal. No scleral icterus.  Glasses.  Blue eyes.  Neck: Normal range of motion. Neck supple. No JVD present.  Cardiovascular: Normal rate, regular rhythm and normal heart sounds. Exam reveals no gallop and no friction rub.  No murmur heard. Pulmonary/Chest: Effort normal and breath sounds normal. No respiratory distress. He has no wheezes. He has no rales.  Abdominal: Soft. Bowel sounds are normal. He exhibits no distension and no mass. There is no abdominal tenderness. There is no rebound and no guarding.  Umbilical hernia.  Musculoskeletal: Normal range of motion.        General: Edema (1+ right lower extremity edema below knee) present.  Lymphadenopathy:    He has no cervical adenopathy.  Neurological: He is alert and oriented to person, place, and time.  Skin: Skin is warm. No rash noted. He is not diaphoretic. No erythema. No pallor.  Upper extremity ecchymosis.  Psychiatric: He has a normal mood and affect. His behavior is normal. Judgment and thought  content normal.  Nursing note and vitals reviewed.    Appointment on 12/16/2018  Component Date Value Ref Range Status  . Ferritin 12/16/2018 19* 24 - 336 ng/mL Final   Performed at [facility], [address] [place], [place] [address]  . WBC 12/16/2018 7.2  4.0 - 10.5 K/uL Final  . RBC 12/16/2018 4.15* 4.22 - 5.81 MIL/uL Final  . Hemoglobin 12/16/2018 11.8* 13.0 - 17.0 g/dL Final  . HCT 12/16/2018 35.6* 39.0 - 52.0 % Final  . MCV 12/16/2018 85.8  80.0 - 100.0 fL Final  .  MCH 12/16/2018 28.4  26.0 - 34.0 pg Final  . MCHC 12/16/2018 33.1  30.0 - 36.0 g/dL Final  . RDW 12/16/2018 14.0  11.5 - 15.5 % Final  . Platelets 12/16/2018 190  150 - 400 K/uL Final  . nRBC 12/16/2018 0.0  0.0 - 0.2 % Final  . Neutrophils Relative % 12/16/2018 67  % Final  . Neutro Abs 12/16/2018 4.7  1.7 - 7.7 K/uL Final  . Lymphocytes Relative 12/16/2018 22  % Final  . Lymphs Abs 12/16/2018 1.6  0.7 - 4.0 K/uL Final  . Monocytes Relative 12/16/2018 8  % Final  . Monocytes Absolute 12/16/2018 0.6  0.1 - 1.0 K/uL Final  . Eosinophils Relative 12/16/2018 2  % Final  . Eosinophils Absolute 12/16/2018 0.2  0.0 - 0.5 K/uL Final  . Basophils Relative 12/16/2018 1  % Final  . Basophils Absolute 12/16/2018 0.1  0.0 - 0.1 K/uL Final  . Immature Granulocytes 12/16/2018 0  % Final  . Abs Immature Granulocytes 12/16/2018 0.03  0.00 - 0.07 K/uL Final   Performed at [facility], [address], [place], [place] [address]    Assessment:  Spencer Herring is a 63 y.o. male with iron deficiency anemia secondary to gastric bypass surgery.    Per patient, anemia was present prior to gastric bypass surgery.  Diet is good.  Labs on 05/14/2018 revealed hematocrit of 3 5.7, hemoglobin 11.4, MCV 89.3, platelets 201,000, WBC 7300 with an ANC of 4690.  Creatinine albumin protein LFTs and TSH were normal.  Urinalysis did not reveal hematuria.  B12 deficiency: Was 1,026 on 09/08/2018. Receives B12 injections per  PCP.  Last one on 12/16/2018.  He is on oral supplementation as well.  Folate deficiency: 26 on 06/08/2018.  He is on oral supplementation.  Labs on 05/14/2018 revealed a hematocrit of 35.7, hemoglobin 11.4, MCV 89.3, platelets 201,000, WBC 7300 with an ANC of 4690.  Normal studies included: creatinine, albumen, protein, LFTs, and TSH.  Urinalysis revealed no hematuria.  He has received Feraheme:  06/11/2018, 06/18/2018, 09/08/2018.   Ferritin has been followed:  20 on 06/30/2014, 16 on 11/03/2014, 37 on 02/28/2015, 22 on 12/28/2015, 19 on 02/13/2017, 16 on 06/08/2018, 60 on 07/30/2018, and 23 on 09/06/2018.  Today, ferritin is 19 (low).  EGD on 01/04/2013 revealed a normal esophagus with non-bleeding erosive gastropathy.  Gastric remnant biopsies revealed focal superficial vascular congestion and increase in parietal cell size with atypical protrusions.  There was no dysplasia or malignancy.  Colonoscopy on 12/01/2014 revealed 2 ulcers at the hepatic flexure. There was diverticulosis in the sigmoid colon.  Pathology from the ulcers at the hepatic flexure revealed focally inflamed with ischemia and reactive epithelial atypia.  There were separate fragment of fibrinopurulent debris c/w ulceration.  There was no malignancy.  Additional biopsies were negative. Colonoscopy on 04/02/2015 revealed diverticulosis in the sigmoid colon.  Ulcers were no longer present.  Repeat colonoscopy is planned for 5 years.  Guaiac cards were negative on 01/09/2016 and 10/24/2016.  Urinalysis on 05/14/2018 revealed no hematuria.   Colonoscopy and EGD with Dr. Alice Reichert on 10/27/2018.  No endoscopic abnormality to explain dysphagia, 2 cm hiatal hernia, 1 nonbleeding superficial gastric ulcer with no stigmata of bleeding found in antrum 7 mm in largest dimension, nonbleeding superficial gastric ulcer found at the pylorus.  Colonoscopy showed diverticulosis in the left colon, 7 mm polyp transverse colon which was sessile serrated  polyp on pathology, 4 mm polyp in rectum which  was tubular adenoma, nonbleeding internal hemorrhoids.  Antral biopsies with foveolar hyperplasia and lamina propria edema.  Negative for H. pylori, dysplasia, or malignancy.  He was started on Protonix.  Symptomatically, he continues to be low energy and somewhat pale.  Working full-time.  Weight gain.  Around 4 pounds in past 8 months.  Works full-time but no energy otherwise.  Exam is unremarkable otherwise.  No melena, hematochezia, or hematuria.  Plan for video capsule endoscopy with GI.    Plan: 1.   Labs: CBC, ferritin from today were reviewed independently with patient and discussed in detail  2.   Iron deficiency anemia     - Present prior to gastric bypass per his report  - Poor absorption of oral iron likely secondary to gastric bypass surgery  - History of erosive gastropathy and ulcers of hepatic flexure.  -Gastric ulcers on recent EGD may be contributing  -Ferritin today low at 19, hemoglobin 11.8, hematocrit 35.6.  MVC, MCH,  MCHC, and RDW normal.   -Recommend Feraheme today and repeat second dose next week  -Continue Protonix per GI & proceed with video capsule endoscopy 3.   B12 deficiency  -Likely secondary to gastric bypass  -Continue oral iron and B12 injections per PCP (last injection on  12/16/2018)  Disposition:  - Feraheme today. RTC in 1 week for Feraheme then in 3 months return for MD evaluation and consideration of Feraheme with labs a few days prior (cbc with diff, ferritin)   I discussed the assessment and treatment plan with the patient.  The patient was provided an opportunity to ask questions and all were answered.  The patient agreed with the plan and demonstrated an understanding of the instructions.  The patient was advised to call back if the symptoms worsen or if the condition fails to improve as anticipated.  Beckey Rutter, DNP, AGNP-C [facility] (work cell) [phone]  (office)  CC: Dr. Mike Gip

## 2018-12-17 NOTE — Patient Instructions (Signed)

## 2018-12-23 ENCOUNTER — Other Ambulatory Visit: Payer: Self-pay

## 2018-12-24 ENCOUNTER — Inpatient Hospital Stay: Payer: BLUE CROSS/BLUE SHIELD

## 2018-12-24 VITALS — BP 106/66 | HR 56 | Temp 97.2°F | Resp 16

## 2018-12-24 DIAGNOSIS — K9589 Other complications of other bariatric procedure: Secondary | ICD-10-CM

## 2018-12-24 DIAGNOSIS — D509 Iron deficiency anemia, unspecified: Secondary | ICD-10-CM

## 2018-12-24 MED ORDER — SODIUM CHLORIDE 0.9 % IV SOLN
510.0000 mg | Freq: Once | INTRAVENOUS | Status: AC
  Administered 2018-12-24: 14:00:00 510 mg via INTRAVENOUS
  Filled 2018-12-24: qty 17

## 2018-12-24 MED ORDER — SODIUM CHLORIDE 0.9 % IV SOLN
Freq: Once | INTRAVENOUS | Status: AC
  Administered 2018-12-24: 14:00:00 via INTRAVENOUS
  Filled 2018-12-24: qty 250

## 2018-12-24 NOTE — Patient Instructions (Signed)

## 2019-03-07 ENCOUNTER — Encounter: Payer: Self-pay | Admitting: Hematology and Oncology

## 2019-03-08 ENCOUNTER — Other Ambulatory Visit: Payer: Self-pay

## 2019-03-08 ENCOUNTER — Encounter: Payer: Self-pay | Admitting: Hematology and Oncology

## 2019-03-08 ENCOUNTER — Inpatient Hospital Stay: Payer: BLUE CROSS/BLUE SHIELD | Attending: Hematology and Oncology

## 2019-03-08 DIAGNOSIS — K9589 Other complications of other bariatric procedure: Secondary | ICD-10-CM

## 2019-03-08 DIAGNOSIS — Z87891 Personal history of nicotine dependence: Secondary | ICD-10-CM | POA: Diagnosis not present

## 2019-03-08 DIAGNOSIS — E538 Deficiency of other specified B group vitamins: Secondary | ICD-10-CM | POA: Insufficient documentation

## 2019-03-08 DIAGNOSIS — Z79899 Other long term (current) drug therapy: Secondary | ICD-10-CM | POA: Insufficient documentation

## 2019-03-08 DIAGNOSIS — Z7982 Long term (current) use of aspirin: Secondary | ICD-10-CM | POA: Diagnosis not present

## 2019-03-08 DIAGNOSIS — D509 Iron deficiency anemia, unspecified: Secondary | ICD-10-CM | POA: Insufficient documentation

## 2019-03-08 DIAGNOSIS — J449 Chronic obstructive pulmonary disease, unspecified: Secondary | ICD-10-CM | POA: Diagnosis not present

## 2019-03-08 DIAGNOSIS — Z9884 Bariatric surgery status: Secondary | ICD-10-CM | POA: Diagnosis not present

## 2019-03-08 LAB — CBC WITH DIFFERENTIAL/PLATELET
Abs Immature Granulocytes: 0.04 10*3/uL (ref 0.00–0.07)
Basophils Absolute: 0 10*3/uL (ref 0.0–0.1)
Basophils Relative: 0 %
Eosinophils Absolute: 0.1 10*3/uL (ref 0.0–0.5)
Eosinophils Relative: 1 %
HCT: 35.3 % — ABNORMAL LOW (ref 39.0–52.0)
Hemoglobin: 11.7 g/dL — ABNORMAL LOW (ref 13.0–17.0)
Immature Granulocytes: 0 %
Lymphocytes Relative: 14 %
Lymphs Abs: 1.5 10*3/uL (ref 0.7–4.0)
MCH: 29.2 pg (ref 26.0–34.0)
MCHC: 33.1 g/dL (ref 30.0–36.0)
MCV: 88 fL (ref 80.0–100.0)
Monocytes Absolute: 0.8 10*3/uL (ref 0.1–1.0)
Monocytes Relative: 8 %
Neutro Abs: 8.1 10*3/uL — ABNORMAL HIGH (ref 1.7–7.7)
Neutrophils Relative %: 77 %
Platelets: 192 10*3/uL (ref 150–400)
RBC: 4.01 MIL/uL — ABNORMAL LOW (ref 4.22–5.81)
RDW: 13.9 % (ref 11.5–15.5)
WBC: 10.6 10*3/uL — ABNORMAL HIGH (ref 4.0–10.5)
nRBC: 0 % (ref 0.0–0.2)

## 2019-03-08 LAB — FERRITIN: Ferritin: 62 ng/mL (ref 24–336)

## 2019-03-08 NOTE — Progress Notes (Signed)
[facility]  [address], [address] [place], [place] [address] Phone: [phone]  Fax: [phone]   Clinic Day:  03/09/2019  Referring physician: Tracie Harrier, MD  Chief Complaint: Spencer Herring is a 63 y.o. male with s/p gastric bypass surgery and iron deficiency anemia who is seen for 6 month assessment.   HPI: The patient was last seen in the hematology clinic on 09/08/2018. At that time, he noted "no energy". He denied any melena, hematochezia or hematuria. Exam was unremarkable. Hematocrit 32.7 with a hemoglobin of 10.8. Ferritin was 23. Vitamin B-12 was 1,026.  He received Feraheme.  He was referred to GI.  Colonoscopy on 10/27/2018 revealed diverticulosis in the left colon. There was a 7 mm polyp in the transverse colon (sessile serrated polyp) and a 4 mm polyp in the rectum (tubular adenoma)  There were non-bleeding internal hemorrhoids. A tattoo was seen in the distal ascending colon. The tattoo site appeared normal.  Upper endoscopy on 10/27/2018 revealed no endoscopic esophageal abnormality to explain patient's dysphagia. There was a 2 cm hiatal hernia and gastritis (gastric antral and oxyntic mucosa with foveolar hyperplasia and lamina propria edema).  There was non-bleeding gastric ulcer with no stigmata of bleeding. 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  He had a follow up with  Octavia Bruckner, PA on 12/08/2018. He still localized dysphagia at the level of the xiphoid process. He felt like sometimes food got caught hanging for about 5 minutes before proceeding down. Plan was for small bowel capsule endoscopy to rule out small bowel ulcers, neoplasm, and AVMs.   He was seen by Dr. Ginette Pitman on 12/16/2018 for an annual exam. He continued to feel tired. He had occasional  "twinges" of chest pains. He had bilateral lower extremity edema. Patient noted right ankle pain improved. His back pain improved with injection  therapy. He was off Cymbalta, Flomax, and Amlodipine. He was on Percocet and was advised not to take NSAIDs because of GI bleed. Patient received his B12 injection. He will follow up in 6 months.  Patient was seen in the hematology clinic by Beckey Rutter, NP on 12/17/2018. At that time, he continued to be low energy and was somewhat pale. Ferritin was 19.  He received Feraheme on 12/17/2018 and 12/24/2018.   Labs followed: 12/16/2018: Hematocrit 35.6, hemoglobin 11.8, MCV 85.8, platelets 190,000, WBC 7,200.  03/08/2019: Hematocrit 35.3, hemoglobin 11.7, MCV 88.0, platelets 192,000, WBC 10,600. Ferritin 62.  During the interim, he has continued to feel tired.  He denies any bleeding.   Past Medical History:  Diagnosis Date  . Anemia   . Asthma   . COPD (chronic obstructive pulmonary disease) ([place])   . Morbid obesity ([place])   . Neuromuscular disorder ([place])   . Sleep apnea     Past Surgical History:  Procedure Laterality Date  . BARIATRIC SURGERY N/A   . COLONOSCOPY N/A 04/02/2015   Procedure: COLONOSCOPY;  Surgeon: Josefine Class, MD;  Location: [facility] ENDOSCOPY;  Service: Endoscopy;  Laterality: N/A;  . COLONOSCOPY WITH PROPOFOL N/A 12/01/2014   Procedure: COLONOSCOPY WITH PROPOFOL;  Surgeon: Josefine Class, MD;  Location: [facility] ENDOSCOPY;  Service: Endoscopy;  Laterality: N/A;  . COLONOSCOPY WITH PROPOFOL N/A 10/27/2018   Procedure: COLONOSCOPY WITH PROPOFOL;  Surgeon: Toledo, Benay Pike, MD;  Location: [facility] ENDOSCOPY;  Service: Gastroenterology;  Laterality: N/A;  . CORONARY STENT INTERVENTION N/A 12/01/2017   Procedure: CORONARY STENT INTERVENTION;  Surgeon: Isaias Cowman, MD;  Location: [place] CV LAB;  Service: Cardiovascular;  Laterality: N/A;  . ESOPHAGOGASTRODUODENOSCOPY (EGD) WITH PROPOFOL N/A 10/27/2018   Procedure: ESOPHAGOGASTRODUODENOSCOPY (EGD) WITH PROPOFOL;  Surgeon: Toledo, Benay Pike, MD;  Location: [facility] ENDOSCOPY;  Service: Gastroenterology;  Laterality:  N/A;  . LEFT HEART CATH AND CORONARY ANGIOGRAPHY Left 12/01/2017   Procedure: LEFT HEART CATH AND CORONARY ANGIOGRAPHY;  Surgeon: Corey Skains, MD;  Location: [place] [facility] LAB;  Service: Cardiovascular;  Laterality: Left;  . LOWER EXTREMITY ANGIOGRAPHY Right 12/17/2017   Procedure: LOWER EXTREMITY ANGIOGRAPHY;  Surgeon: Algernon Huxley, MD;  Location: [place] [facility] LAB;  Service: Cardiovascular;  Laterality: Right;  . PNEUMONECTOMY Left     Family History  Problem Relation Age of Onset  . Stroke Mother   . Heart disease Father   . Heart disease Paternal Grandfather     Social History:  reports that he quit smoking about 19 years ago. His smoking use included cigarettes. He has never used smokeless tobacco. He reports that he does not drink alcohol or use drugs.  Former smoker; quit x 19 years ago. He does not use alcohol. Employed full time, as a Librarian, academic, in a Writer.He lives in [place]. The patient is alone today.  Allergies:  Allergies  Allergen Reactions  . Latex Rash    Current Medications: Current Outpatient Medications  Medication Sig Dispense Refill  . albuterol (ACCUNEB) 0.63 MG/3ML nebulizer solution Take 1 ampule by nebulization every 4 (four) hours as needed for wheezing.    Marland Kitchen albuterol (VENTOLIN HFA) 108 (90 Base) MCG/ACT inhaler Ventolin HFA 90 mcg/actuation aerosol inhaler  INHALE 2 INHALATIONS INTO THE LUNGS EVERY 4 (FOUR) HOURS AS NEEDED FOR WHEEZING    . ALPRAZolam (XANAX) 0.25 MG tablet Take 0.25 mg by mouth at bedtime as needed for anxiety.    Marland Kitchen amLODipine (NORVASC) 2.5 MG tablet Take 1 tablet (2.5 mg total) by mouth daily. 90 tablet 4  . ascorbic acid (VITAMIN C) 250 MG tablet Take 250 mg by mouth daily.    Marland Kitchen aspirin 81 MG chewable tablet Chew 1 tablet (81 mg total) by mouth daily. 90 tablet 4  . atorvastatin (LIPITOR) 40 MG tablet Take 1 tablet (40 mg total) by mouth daily. 90 tablet 4  . budesonide-formoterol (SYMBICORT) 160-4.5 MCG/ACT inhaler  Inhale 2 puffs into the lungs daily as needed (SHORTNESS OF BREATH).     . ferrous sulfate 325 (65 FE) MG tablet Take 325 mg by mouth 2 (two) times daily with a meal.     . gabapentin (NEURONTIN) 300 MG capsule Take 300 mg by mouth at bedtime.    . Multiple Vitamin (MULTIVITAMIN) tablet Take 1 tablet by mouth daily.    Marland Kitchen oxyCODONE (OXY IR/ROXICODONE) 5 MG immediate release tablet oxycodone 5 mg tablet  TAKE ONE TABLET BY MOUTH EVERY 6 HOURS    . oxyCODONE-acetaminophen (PERCOCET) 7.5-325 MG per tablet Take 1 tablet by mouth 2 (two) times daily as needed for severe pain.    . pantoprazole (PROTONIX) 40 MG tablet Take by mouth.    . potassium chloride (KLOR-CON M10) 10 MEQ tablet Take 10 mEq by mouth daily.     . traMADol (ULTRAM) 50 MG tablet Take 50 mg by mouth every 12 (twelve) hours as needed for moderate pain.    . cyclobenzaprine (FLEXERIL) 5 MG tablet Take 5 mg by mouth as needed.    . DULoxetine (CYMBALTA) 30 MG capsule Take by mouth daily.    . isosorbide mononitrate (IMDUR)  30 MG 24 hr tablet     . losartan (COZAAR) 25 MG tablet Take 1 tablet by mouth daily.    . nitroGLYCERIN (NITROSTAT) 0.4 MG SL tablet Place 0.4 mg under the tongue every 5 (five) minutes as needed.     . predniSONE (STERAPRED UNI-PAK 21 TAB) 5 MG (21) TBPK tablet TAKE 6 TABLETS ON DAY 1 AS DIRECTED ON PACKAGE AND DECREASE BY 1 TAB EACH DAY FOR A TOTAL OF 6 DAYS    . tamsulosin (FLOMAX) 0.4 MG CAPS capsule     . telmisartan (MICARDIS) 20 MG tablet TAKE 1 TABLET BY MOUTH EVERY DAY    . torsemide (DEMADEX) 20 MG tablet TAKE 1 TABLET BY MOUTH EVERY DAY     No current facility-administered medications for this visit.     Review of Systems  Constitutional: Positive for malaise/fatigue. Negative for chills, diaphoresis, fever and weight loss (up 3 pounds from 12/17/2018).       Feels "tired".  HENT: Negative.  Negative for congestion, ear pain, hearing loss, nosebleeds, sinus pain and sore throat.   Eyes: Negative.   Negative for blurred vision and double vision.  Respiratory: Negative for cough, sputum production, shortness of breath and wheezing.        Sleep apnea using BPAP.  Cardiovascular: Negative.  Negative for chest pain, palpitations, orthopnea, leg swelling and PND.       S/p cardiac stent.  Gastrointestinal: Negative for abdominal pain, blood in stool, constipation, diarrhea, heartburn, melena, nausea and vomiting.       Dysphagia comes and goes.  Interval EGD and colonoscopy.  Genitourinary: Negative.  Negative for dysuria, frequency, hematuria and urgency.  Musculoskeletal: Negative for back pain, joint pain, myalgias and neck pain.       Arthritis.  Skin: Negative.  Negative for itching and rash.  Neurological: Negative.  Negative for dizziness, tingling, sensory change, speech change, focal weakness, weakness and headaches.  Endo/Heme/Allergies: Bruises/bleeds easily (bruises easily on arms).  Psychiatric/Behavioral: Negative for depression and memory loss. The patient is not nervous/anxious and does not have insomnia.   All other systems reviewed and are negative.  Performance status (ECOG):  1  Vitals Blood pressure 117/68, pulse 66, temperature 97.7 F (36.5 C), temperature source Tympanic, resp. rate 18, height 5\' 7"  (1.702 m), weight 232 lb 2.3 oz (105.3 kg), SpO2 98 %.  Physical Exam  Constitutional: He is oriented to person, place, and time. He appears well-developed and well-nourished. No distress.  HENT:  Head: Normocephalic and atraumatic.  Mouth/Throat: Oropharynx is clear and moist. No oropharyngeal exudate.  Gray hair and [employer].  Mask.  Eyes: Pupils are equal, round, and reactive to light. Conjunctivae and EOM are normal. No scleral icterus.  Glasses.  Blue eyes.  Neck: Normal range of motion. Neck supple. No JVD present.  Cardiovascular: Normal rate, regular rhythm and normal heart sounds. Exam reveals no gallop and no friction rub.  No murmur heard.  Pulmonary/Chest: Effort normal and breath sounds normal. No respiratory distress. He has no wheezes. He has no rales.  Abdominal: Soft. Bowel sounds are normal. He exhibits no distension and no mass. There is no abdominal tenderness. There is no rebound and no guarding.  Musculoskeletal: Normal range of motion.        General: No tenderness or edema.  Lymphadenopathy:       Head (right side): No preauricular, no posterior auricular and no occipital adenopathy present.       Head (left side): No preauricular,  no posterior auricular and no occipital adenopathy present.    He has no cervical adenopathy.    He has no axillary adenopathy.       Right: No inguinal adenopathy present.       Left: No inguinal and no supraclavicular adenopathy present.  Neurological: He is alert and oriented to person, place, and time.  Skin: Skin is warm and dry. No rash noted. He is not diaphoretic. No erythema. No pallor.  Upper extremity ecchymosis.  Psychiatric: He has a normal mood and affect. His behavior is normal. Judgment and thought content normal.  Nursing note and vitals reviewed.   Appointment on 03/08/2019  Component Date Value Ref Range Status  . Ferritin 03/08/2019 62  24 - 336 ng/mL Final   Performed at [facility], [address] [place], [place] [address]  . WBC 03/08/2019 10.6* 4.0 - 10.5 K/uL Final  . RBC 03/08/2019 4.01* 4.22 - 5.81 MIL/uL Final  . Hemoglobin 03/08/2019 11.7* 13.0 - 17.0 g/dL Final  . HCT 03/08/2019 35.3* 39.0 - 52.0 % Final  . MCV 03/08/2019 88.0  80.0 - 100.0 fL Final  . MCH 03/08/2019 29.2  26.0 - 34.0 pg Final  . MCHC 03/08/2019 33.1  30.0 - 36.0 g/dL Final  . RDW 03/08/2019 13.9  11.5 - 15.5 % Final  . Platelets 03/08/2019 192  150 - 400 K/uL Final  . nRBC 03/08/2019 0.0  0.0 - 0.2 % Final  . Neutrophils Relative % 03/08/2019 77  % Final  . Neutro Abs 03/08/2019 8.1* 1.7 - 7.7 K/uL Final  . Lymphocytes Relative 03/08/2019 14  % Final  . Lymphs Abs  03/08/2019 1.5  0.7 - 4.0 K/uL Final  . Monocytes Relative 03/08/2019 8  % Final  . Monocytes Absolute 03/08/2019 0.8  0.1 - 1.0 K/uL Final  . Eosinophils Relative 03/08/2019 1  % Final  . Eosinophils Absolute 03/08/2019 0.1  0.0 - 0.5 K/uL Final  . Basophils Relative 03/08/2019 0  % Final  . Basophils Absolute 03/08/2019 0.0  0.0 - 0.1 K/uL Final  . Immature Granulocytes 03/08/2019 0  % Final  . Abs Immature Granulocytes 03/08/2019 0.04  0.00 - 0.07 K/uL Final   Performed at [facility], [address], [place], [place] [address]    Assessment:  Spencer Herring is a 63 y.o. male withiron deficiency anemiasecondary to gastric bypass surgery.He states that his anemia started before his surgery.  Dietis good.  Labs on 01/17/2020revealed a hematocrit of 35.7, hemoglobin 11.4, MCV 89.3, platelets 201,000, WBC 7300 with an ANC of 4690. Normal studies included: creatinine,albumen, protein, LFTs, andTSH. Urinalysis revealed no hematuria.  He has received Feraheme:  06/11/2018 and 06/18/2018, 09/08/2018, 12/17/2018 and 12/24/2018.  Ferritinhas been followed: 20 on 06/30/2014, 16 on 11/03/2014, 37 on 02/28/2015, 22 on 12/28/2015, 19 on 02/13/2017, 16 on 06/08/2018, 60 on 07/30/2018, 23 on 09/06/2018, 19 on 12/16/2018, and 62 on 03/08/2019.   EGDon 01/04/2013 revealed a normal esophagus with non-bleeding erosive gastropathy.Gastric remnant biopsies revealed focal superficial vascular congestion and increase in parietal cell size with atypical protrusions. There was no dysplasia or malignancy.  EGD on 10/27/2018 revealed no endoscopic esophageal abnormality to explain patient's dysphagia. There was a 2 cm hiatal hernia and gastritis (gastric antral and oxyntic mucosa with foveolar hyperplasia and lamina propria edema). There was non-bleeding gastric ulcer with no stigmata of bleeding. 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    Colonoscopyon 12/01/2014 revealed 2 ulcersat  the hepatic flexure. There was diverticulosis in the sigmoid colon.Pathology from the ulcers at the hepatic flexure revealed focally inflamed with ischemia and reactive epithelial atypia. There were separate fragment of fibrinopurulent debris c/w ulceration. There was no malignancy. Additional biopsies were negative. Colonoscopyon 04/02/2015 revealed diverticulosis in the sigmoid colon. Ulcers were no longer present. Colonoscopy on 10/27/2018 revealed diverticulosis in the left colon. There was a 7 mm polyp in the transverse colon (sessile serrated polyp) and a 4 mm polyp in the rectum (tubular adenoma). There were non-bleeding internal hemorrhoids. A tattoo was seen in the distal ascending colon.   Guaiac cardswere negative on 01/09/2016 and 10/24/2016.  Urinalysis on 05/14/2018 revealed no hematuria.  He has B12 deficiency.  He receives B12 with Dr Ginette Pitman.  B12 was 1026 on 09/08/2018.  Symptomatically, he is fatigued.  Exam is stable.  Hemoglobin is 11.7.  Ferritin is 62.  Plan: 1.   Review labs from 03/08/2019. 2.Iron deficiency anemia Per patient's history, anemia started before gastric bypass surgery.             He is s/p gastric bypass surgery with poor absorption of oral ion. 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 Review interval EGD and colonoscopy on 10/27/2018.   Gastritis and non-bleeding ulcer.  Urinalysis in 04/2018 revealed no hematuria. He denies any bleeding. Continue to monitor. 3.B12 deficiency             Patient is on oral B12.             He receives B12 injections when he sees Dr Ginette Pitman every 6 months (last 12/16/2018).             Encourage monthly B12.    Preath B12.  B12 today. 4.   RTC in 3 months for MD assessment, labs (CBC with diff, ferritin, iron studies, sed rate, folate, retic- day before), and +/-  Feraheme.  I discussed the assessment and treatment plan with the patient.  The patient was provided an opportunity to ask questions and all were answered.  The patient agreed with the plan and demonstrated an understanding of the instructions.  The patient was advised to call back if the symptoms worsen or if the condition fails to improve as anticipated.   Lequita Asal, MD, PhD    03/09/2019, 10:43 AM  I, Selena Batten, am acting as scribe for [employer]. Mike Gip, MD, PhD.  I, Melissa C. Mike Gip, MD, have reviewed the above documentation for accuracy and completeness, and I agree with the above.

## 2019-03-09 ENCOUNTER — Inpatient Hospital Stay (HOSPITAL_BASED_OUTPATIENT_CLINIC_OR_DEPARTMENT_OTHER): Payer: BLUE CROSS/BLUE SHIELD | Admitting: Hematology and Oncology

## 2019-03-09 ENCOUNTER — Encounter: Payer: Self-pay | Admitting: Hematology and Oncology

## 2019-03-09 ENCOUNTER — Inpatient Hospital Stay: Payer: BLUE CROSS/BLUE SHIELD

## 2019-03-09 VITALS — BP 117/68 | HR 66 | Temp 97.7°F | Resp 18 | Ht 67.0 in | Wt 232.1 lb

## 2019-03-09 DIAGNOSIS — D509 Iron deficiency anemia, unspecified: Secondary | ICD-10-CM

## 2019-03-09 DIAGNOSIS — D508 Other iron deficiency anemias: Secondary | ICD-10-CM

## 2019-03-09 DIAGNOSIS — K9589 Other complications of other bariatric procedure: Secondary | ICD-10-CM

## 2019-03-09 DIAGNOSIS — E538 Deficiency of other specified B group vitamins: Secondary | ICD-10-CM | POA: Diagnosis not present

## 2019-03-09 MED ORDER — CYANOCOBALAMIN 1000 MCG/ML IJ SOLN
1000.0000 ug | Freq: Once | INTRAMUSCULAR | Status: AC
  Administered 2019-03-09: 1000 ug via INTRAMUSCULAR

## 2019-03-09 NOTE — Patient Instructions (Signed)

## 2019-03-09 NOTE — Progress Notes (Signed)
No new changes noted today 

## 2019-03-16 DIAGNOSIS — E538 Deficiency of other specified B group vitamins: Secondary | ICD-10-CM | POA: Insufficient documentation

## 2019-03-17 ENCOUNTER — Other Ambulatory Visit: Payer: BLUE CROSS/BLUE SHIELD

## 2019-03-18 ENCOUNTER — Ambulatory Visit: Payer: BLUE CROSS/BLUE SHIELD | Admitting: Hematology and Oncology

## 2019-03-18 ENCOUNTER — Ambulatory Visit: Payer: BLUE CROSS/BLUE SHIELD

## 2019-06-17 DIAGNOSIS — Z9884 Bariatric surgery status: Secondary | ICD-10-CM | POA: Insufficient documentation

## 2019-07-22 ENCOUNTER — Encounter: Payer: Self-pay | Admitting: Hematology and Oncology

## 2020-04-18 ENCOUNTER — Other Ambulatory Visit: Payer: Self-pay

## 2020-04-18 ENCOUNTER — Encounter: Payer: 59 | Attending: Cardiovascular Disease

## 2020-04-18 DIAGNOSIS — I208 Other forms of angina pectoris: Secondary | ICD-10-CM

## 2020-04-18 NOTE — Progress Notes (Signed)
Virtual Visit completed. Patient informed on EP and RD appointment and 6 Minute walk test. Patient also informed of patient health questionnaires on My Chart. Patient Verbalizes understanding. Visit diagnosis can be found in [facility] 05/26/2019. °

## 2020-04-30 ENCOUNTER — Other Ambulatory Visit: Payer: Self-pay

## 2020-04-30 ENCOUNTER — Encounter: Payer: 59 | Attending: Cardiovascular Disease | Admitting: *Deleted

## 2020-04-30 VITALS — Ht 65.0 in | Wt 230.6 lb

## 2020-04-30 DIAGNOSIS — Z87891 Personal history of nicotine dependence: Secondary | ICD-10-CM | POA: Insufficient documentation

## 2020-04-30 DIAGNOSIS — Z79899 Other long term (current) drug therapy: Secondary | ICD-10-CM | POA: Insufficient documentation

## 2020-04-30 DIAGNOSIS — I208 Other forms of angina pectoris: Secondary | ICD-10-CM | POA: Insufficient documentation

## 2020-04-30 DIAGNOSIS — Z7982 Long term (current) use of aspirin: Secondary | ICD-10-CM | POA: Insufficient documentation

## 2020-04-30 DIAGNOSIS — Z79891 Long term (current) use of opiate analgesic: Secondary | ICD-10-CM | POA: Insufficient documentation

## 2020-04-30 DIAGNOSIS — Z7951 Long term (current) use of inhaled steroids: Secondary | ICD-10-CM | POA: Diagnosis not present

## 2020-04-30 NOTE — Progress Notes (Signed)
Cardiac Individual Treatment Plan  Patient Details  Name: Spencer Herring MRN: [id number] Date of Birth: 1956-03-02 Referring Provider:   Flowsheet Row Cardiac Rehab from 04/30/2020 in [facility] Cardiac and Pulmonary Rehab  Referring Provider de Ella Jubilee, Betha Loa MD      Initial Encounter Date:  Flowsheet Row Cardiac Rehab from 04/30/2020 in [facility] Cardiac and Pulmonary Rehab  Date 04/30/20      Visit Diagnosis: Stable angina (HCC)  Patient's Home Medications on Admission:  Current Outpatient Medications:  .  albuterol (ACCUNEB) 0.63 MG/3ML nebulizer solution, Take 1 ampule by nebulization every 4 (four) hours as needed for wheezing., Disp: , Rfl:  .  albuterol (VENTOLIN HFA) 108 (90 Base) MCG/ACT inhaler, Ventolin HFA 90 mcg/actuation aerosol inhaler  INHALE 2 INHALATIONS INTO THE LUNGS EVERY 4 (FOUR) HOURS AS NEEDED FOR WHEEZING, Disp: , Rfl:  .  ALPRAZolam (XANAX) 0.25 MG tablet, Take 0.25 mg by mouth at bedtime as needed for anxiety., Disp: , Rfl:  .  amLODipine (NORVASC) 2.5 MG tablet, Take 1 tablet (2.5 mg total) by mouth daily., Disp: 90 tablet, Rfl: 4 .  ascorbic acid (VITAMIN C) 250 MG tablet, Take 250 mg by mouth daily., Disp: , Rfl:  .  aspirin 81 MG chewable tablet, Chew 1 tablet (81 mg total) by mouth daily., Disp: 90 tablet, Rfl: 4 .  atorvastatin (LIPITOR) 40 MG tablet, Take 1 tablet (40 mg total) by mouth daily., Disp: 90 tablet, Rfl: 4 .  budesonide-formoterol (SYMBICORT) 160-4.5 MCG/ACT inhaler, Inhale 2 puffs into the lungs daily as needed (SHORTNESS OF BREATH). , Disp: , Rfl:  .  carvedilol (COREG) 6.25 MG tablet, Take by mouth., Disp: , Rfl:  .  cyclobenzaprine (FLEXERIL) 5 MG tablet, Take 5 mg by mouth as needed. (Patient not taking: Reported on 04/18/2020), Disp: , Rfl:  .  DULoxetine (CYMBALTA) 30 MG capsule, Take by mouth daily. (Patient not taking: Reported on 04/18/2020), Disp: , Rfl:  .  ferrous sulfate 325 (65 FE) MG tablet, Take 325 mg by mouth 2 (two)  times daily with a meal. , Disp: , Rfl:  .  gabapentin (NEURONTIN) 300 MG capsule, Take 300 mg by mouth at bedtime., Disp: , Rfl:  .  Influenza Virus Vaccine Split SUSP, Fluzone 2013-2014 45 mcg (15 mcg x 3)/0.5 mL intramuscular suspension  TO BE ADMINISTERED BY PHARMACIST FOR IMMUNIZATION, Disp: , Rfl:  .  isosorbide mononitrate (IMDUR) 30 MG 24 hr tablet, , Disp: , Rfl:  .  losartan (COZAAR) 25 MG tablet, Take 1 tablet by mouth daily., Disp: , Rfl:  .  metoprolol succinate (TOPROL-XL) 25 MG 24 hr tablet, Take 25 mg by mouth daily., Disp: , Rfl:  .  Multiple Vitamin (MULTIVITAMIN) tablet, Take 1 tablet by mouth daily., Disp: , Rfl:  .  nitroGLYCERIN (NITROSTAT) 0.4 MG SL tablet, Place 0.4 mg under the tongue every 5 (five) minutes as needed. , Disp: , Rfl:  .  oxyCODONE (OXY IR/ROXICODONE) 5 MG immediate release tablet, oxycodone 5 mg tablet  TAKE ONE TABLET BY MOUTH EVERY 6 HOURS, Disp: , Rfl:  .  oxyCODONE-acetaminophen (PERCOCET) 7.5-325 MG per tablet, Take 1 tablet by mouth 2 (two) times daily as needed for severe pain., Disp: , Rfl:  .  pantoprazole (PROTONIX) 40 MG tablet, Take by mouth., Disp: , Rfl:  .  potassium chloride (KLOR-CON) 10 MEQ tablet, Take 10 mEq by mouth daily. , Disp: , Rfl:  .  predniSONE (STERAPRED UNI-PAK 21 TAB) 5 MG (21) TBPK  tablet, TAKE 6 TABLETS ON DAY 1 AS DIRECTED ON PACKAGE AND DECREASE BY 1 TAB EACH DAY FOR A TOTAL OF 6 DAYS (Patient not taking: Reported on 04/18/2020), Disp: , Rfl:  .  tamsulosin (FLOMAX) 0.4 MG CAPS capsule, , Disp: , Rfl:  .  telmisartan (MICARDIS) 20 MG tablet, TAKE 1 TABLET BY MOUTH EVERY DAY, Disp: , Rfl:  .  torsemide (DEMADEX) 20 MG tablet, TAKE 1 TABLET BY MOUTH EVERY DAY, Disp: , Rfl:  .  traMADol (ULTRAM) 50 MG tablet, Take 50 mg by mouth every 12 (twelve) hours as needed for moderate pain., Disp: , Rfl:  .  vitamin E 180 MG (400 UNITS) capsule, Take by mouth., Disp: , Rfl:   Past Medical History: Past Medical History:  Diagnosis  Date  . Anemia   . Asthma   . COPD (chronic obstructive pulmonary disease) ([place])   . Morbid obesity ([place])   . Neuromuscular disorder ([place])   . Sleep apnea     Tobacco Use: Social History   Tobacco Use  Smoking Status Former Smoker  . Packs/day: 3.00  . Years: 48.00  . Pack years: 144.00  . Types: Cigarettes  . Quit date: 04/29/1999  . Years since quitting: 21.0  Smokeless Tobacco Never Used    Labs: Recent Review Scientist, physiological    Labs for ITP Cardiac and Pulmonary Rehab Latest Ref Rng & Units 10/15/2012   Hemoglobin A1c 4.2 - 6.3 % 5.6       Exercise Target Goals: Exercise Program Goal: Individual exercise prescription set using results from initial 6 min walk test and THRR while considering  patient's activity barriers and safety.   Exercise Prescription Goal: Initial exercise prescription builds to 30-45 minutes a day of aerobic activity, 2-3 days per week.  Home exercise guidelines will be given to patient during program as part of exercise prescription that the participant will acknowledge.   Education: Aerobic Exercise: - Group verbal and visual presentation on the components of exercise prescription. Introduces F.I.T.T principle from ACSM for exercise prescriptions.  Reviews F.I.T.T. principles of aerobic exercise including progression. Written material given at graduation. Flowsheet Row Cardiac Rehab from 04/30/2020 in [facility] Cardiac and Pulmonary Rehab  Education need identified 04/30/20      Education: Resistance Exercise: - Group verbal and visual presentation on the components of exercise prescription. Introduces F.I.T.T principle from ACSM for exercise prescriptions  Reviews F.I.T.T. principles of resistance exercise including progression. Written material given at graduation.    Education: Exercise & Equipment Safety: - Individual verbal instruction and demonstration of equipment use and safety with use of the equipment. Flowsheet Row Cardiac Rehab from  04/30/2020 in [facility] Cardiac and Pulmonary Rehab  Date 04/18/20  Educator [facility]  Instruction Review Code 1- Verbalizes Understanding      Education: Exercise Physiology & General Exercise Guidelines: - Group verbal and written instruction with models to review the exercise physiology of the cardiovascular system and associated critical values. Provides general exercise guidelines with specific guidelines to those with heart or lung disease.    Education: Flexibility, Balance, Mind/Body Relaxation: - Group verbal and visual presentation with interactive activity on the components of exercise prescription. Introduces F.I.T.T principle from ACSM for exercise prescriptions. Reviews F.I.T.T. principles of flexibility and balance exercise training including progression. Also discusses the mind body connection.  Reviews various relaxation techniques to help reduce and manage stress (i.e. Deep breathing, progressive muscle relaxation, and visualization). Balance handout provided to take home. Written material given at graduation.  Activity Barriers & Risk Stratification:  Activity Barriers & Cardiac Risk Stratification - 04/30/20 1410      Activity Barriers & Cardiac Risk Stratification   Activity Barriers Back Problems;Neck/Spine Problems;Shortness of Breath;Muscular Weakness;Deconditioning;Joint Problems;Balance Concerns   Pt has chronic pain in ankles/feet, legs, and back   Cardiac Risk Stratification Moderate           6 Minute Walk:  6 Minute Walk    Row Name 04/30/20 1350         6 Minute Walk   Phase Initial     Distance 1290 feet     Walk Time 6 minutes     # of Rest Breaks 0     MPH 2.44     METS 2.72     RPE 13     Perceived Dyspnea  1     VO2 Peak 9.53     Symptoms Yes (comment)     Comments SOB     Resting HR 65 bpm     Resting BP 122/58     Resting Oxygen Saturation  95 %     Exercise Oxygen Saturation  during 6 min walk 93 %     Max Ex. HR 100 bpm     Max Ex. BP  136/76     2 Minute Post BP 122/60            Oxygen Initial Assessment:   Oxygen Re-Evaluation:   Oxygen Discharge (Final Oxygen Re-Evaluation):   Initial Exercise Prescription:  Initial Exercise Prescription - 04/30/20 1400      Date of Initial Exercise RX and Referring Provider   Date 04/30/20    Referring Provider de Dow Adolph, Arvid Right MD      Treadmill   MPH 2.2    Grade 0.5    Minutes 15    METs 2.84      REL-XR   Level 1    Speed 50    Minutes 15    METs 2.5      T5 Nustep   Level 2    SPM 80    Minutes 15    METs 2.5      Prescription Details   Frequency (times per week) 3    Duration Progress to 30 minutes of continuous aerobic without signs/symptoms of physical distress      Intensity   THRR 40-80% of Max Heartrate 102-139    Ratings of Perceived Exertion 11-13    Perceived Dyspnea 0-4      Progression   Progression Continue to progress workloads to maintain intensity without signs/symptoms of physical distress.      Resistance Training   Training Prescription Yes    Weight 4 lb    Reps 10-15           Perform Capillary Blood Glucose checks as needed.  Exercise Prescription Changes:   Exercise Prescription Changes    Row Name 04/30/20 1400             Response to Exercise   Blood Pressure (Admit) 122/58       Blood Pressure (Exercise) 136/76       Blood Pressure (Exit) 122/60       Heart Rate (Admit) 65 bpm       Heart Rate (Exercise) 100 bpm       Heart Rate (Exit) 66 bpm       Oxygen Saturation (Admit) 95 %       Oxygen Saturation (Exercise)  93 %       Rating of Perceived Exertion (Exercise) 13       Perceived Dyspnea (Exercise) 1       Symptoms SOB       Comments walk test results              Exercise Comments:   Exercise Goals and Review:   Exercise Goals    Row Name 04/30/20 1419             Exercise Goals   Increase Physical Activity Yes       Intervention Develop an individualized  exercise prescription for aerobic and resistive training based on initial evaluation findings, risk stratification, comorbidities and participant's personal goals.;Provide advice, education, support and counseling about physical activity/exercise needs.       Expected Outcomes Short Term: Attend rehab on a regular basis to increase amount of physical activity.;Long Term: Add in home exercise to make exercise part of routine and to increase amount of physical activity.;Long Term: Exercising regularly at least 3-5 days a week.       Increase Strength and Stamina Yes       Intervention Provide advice, education, support and counseling about physical activity/exercise needs.;Develop an individualized exercise prescription for aerobic and resistive training based on initial evaluation findings, risk stratification, comorbidities and participant's personal goals.       Expected Outcomes Short Term: Perform resistance training exercises routinely during rehab and add in resistance training at home;Short Term: Increase workloads from initial exercise prescription for resistance, speed, and METs.;Long Term: Improve cardiorespiratory fitness, muscular endurance and strength as measured by increased METs and functional capacity (6MWT)       Able to understand and use rate of perceived exertion (RPE) scale Yes       Intervention Provide education and explanation on how to use RPE scale       Expected Outcomes Long Term:  Able to use RPE to guide intensity level when exercising independently;Short Term: Able to use RPE daily in rehab to express subjective intensity level       Able to understand and use Dyspnea scale Yes       Intervention Provide education and explanation on how to use Dyspnea scale       Expected Outcomes Short Term: Able to use Dyspnea scale daily in rehab to express subjective sense of shortness of breath during exertion;Long Term: Able to use Dyspnea scale to guide intensity level when exercising  independently       Knowledge and understanding of Target Heart Rate Range (THRR) Yes       Intervention Provide education and explanation of THRR including how the numbers were predicted and where they are located for reference       Expected Outcomes Short Term: Able to state/look up THRR;Short Term: Able to use daily as guideline for intensity in rehab;Long Term: Able to use THRR to govern intensity when exercising independently       Able to check pulse independently Yes       Intervention Provide education and demonstration on how to check pulse in carotid and radial arteries.;Review the importance of being able to check your own pulse for safety during independent exercise       Expected Outcomes Short Term: Able to explain why pulse checking is important during independent exercise;Long Term: Able to check pulse independently and accurately       Understanding of Exercise Prescription Yes  Intervention Provide education, explanation, and written materials on patient's individual exercise prescription       Expected Outcomes Short Term: Able to explain program exercise prescription;Long Term: Able to explain home exercise prescription to exercise independently              Exercise Goals Re-Evaluation :   Discharge Exercise Prescription (Final Exercise Prescription Changes):  Exercise Prescription Changes - 04/30/20 1400      Response to Exercise   Blood Pressure (Admit) 122/58    Blood Pressure (Exercise) 136/76    Blood Pressure (Exit) 122/60    Heart Rate (Admit) 65 bpm    Heart Rate (Exercise) 100 bpm    Heart Rate (Exit) 66 bpm    Oxygen Saturation (Admit) 95 %    Oxygen Saturation (Exercise) 93 %    Rating of Perceived Exertion (Exercise) 13    Perceived Dyspnea (Exercise) 1    Symptoms SOB    Comments walk test results           Nutrition:  Target Goals: Understanding of nutrition guidelines, daily intake of sodium 1500mg , cholesterol 200mg , calories 30%  from fat and 7% or less from saturated fats, daily to have 5 or more servings of fruits and vegetables.  Education: All About Nutrition: -Group instruction provided by verbal, written material, interactive activities, discussions, models, and posters to present general guidelines for heart healthy nutrition including fat, fiber, MyPlate, the role of sodium in heart healthy nutrition, utilization of the nutrition label, and utilization of this knowledge for meal planning. Follow up email sent as well. Written material given at graduation.   Biometrics:  Pre Biometrics - 04/30/20 1420      Pre Biometrics   Height 5\' 5"  (1.651 m)    Weight 230 lb 9.6 oz (104.6 kg)    BMI (Calculated) 38.37    Single Leg Stand 1.3 seconds            Nutrition Therapy Plan and Nutrition Goals:  Nutrition Therapy & Goals - 04/30/20 1001      Nutrition Therapy   Diet Heart healthy, low Na    Protein (specify units) 85g    Fiber 30 grams    Whole Grain Foods 3 servings    Saturated Fats 12 max. grams    Fruits and Vegetables 8 servings/day    Sodium 1.5 grams      Personal Nutrition Goals   Nutrition Goal ST: increase vareity of fruit and vegetable intake LT: maintain healthy changes    Comments B 5am: biscuit (sausage) or usually oatmeal. S: banana and strawberry yogurt.  L 11am: frozen "healthy" meals - usually witch chicken. D: cooks - grilled chicken, hamburger, beans, corn, bags of steamed vegetables and seasoning salt. Drinks: grape juice and water. 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 Discussed heart healthy eating. Jourdin has no concerns at this time.      Intervention Plan   Intervention Prescribe, educate and counsel regarding individualized specific dietary modifications aiming towards targeted core components such as weight, hypertension, lipid management, diabetes, heart failure and other comorbidities.;Nutrition handout(s)  given to patient.    Expected Outcomes Short Term Goal: Understand basic principles of dietary content, such as calories, fat, sodium, cholesterol and nutrients.;Short Term Goal: A plan has been developed with personal nutrition goals set during dietitian appointment.;Long Term Goal: Adherence to prescribed nutrition plan.           Nutrition  Assessments:  MEDIFICTS Score Key:  ?70 Need to make dietary changes   40-70 Heart Healthy Diet  ? 40 Therapeutic Level Cholesterol Diet  Flowsheet Row Cardiac Rehab from 04/30/2020 in [facility] Cardiac and Pulmonary Rehab  Picture Your Plate Total Score on Admission 80     Picture Your Plate Scores:  [id number] Unhealthy dietary pattern with much room for improvement.  41-50 Dietary pattern unlikely to meet recommendations for good health and room for improvement.  51-60 More healthful dietary pattern, with some room for improvement.   >60 Healthy dietary pattern, although there may be some specific behaviors that could be improved.    Nutrition Goals Re-Evaluation:   Nutrition Goals Discharge (Final Nutrition Goals Re-Evaluation):   Psychosocial: Target Goals: Acknowledge presence or absence of significant depression and/or stress, maximize coping skills, provide positive support system. Participant is able to verbalize types and ability to use techniques and skills needed for reducing stress and depression.   Education: Stress, Anxiety, and Depression - Group verbal and visual presentation to define topics covered.  Reviews how body is impacted by stress, anxiety, and depression.  Also discusses healthy ways to reduce stress and to treat/manage anxiety and depression.  Written material given at graduation. Flowsheet Row Cardiac Rehab from 04/30/2020 in [facility] Cardiac and Pulmonary Rehab  Education need identified 04/30/20      Education: Sleep Hygiene -Provides group verbal and written instruction about how sleep can affect your health.  Define  sleep hygiene, discuss sleep cycles and impact of sleep habits. Review good sleep hygiene tips.    Initial Review & Psychosocial Screening:  Initial Psych Review & Screening - 04/18/20 0911      Initial Review   Current issues with Current Sleep Concerns      Family Dynamics   Good Support System? Yes    Comments He can look to his three nieces for support. His family lives close to him and looks out for him and his health.      Barriers   Psychosocial barriers to participate in program The patient should benefit from training in stress management and relaxation.;There are no identifiable barriers or psychosocial needs.      Screening Interventions   Interventions Encouraged to exercise;To provide support and resources with identified psychosocial needs;Provide feedback about the scores to participant    Expected Outcomes Short Term goal: Utilizing psychosocial counselor, staff and physician to assist with identification of specific Stressors or current issues interfering with healing process. Setting desired goal for each stressor or current issue identified.;Long Term Goal: Stressors or current issues are controlled or eliminated.;Short Term goal: Identification and review with participant of any Quality of Life or Depression concerns found by scoring the questionnaire.;Long Term goal: The participant improves quality of Life and PHQ9 Scores as seen by post scores and/or verbalization of changes           Quality of Life Scores:   Quality of Life - 04/30/20 1421      Quality of Life   Select Quality of Life      Quality of Life Scores   Health/Function Pre 22.14 %    Socioeconomic Pre 24.14 %    Psych/Spiritual Pre 22.79 %    Family Pre 26 %    GLOBAL Pre 23.11 %          Scores of 19 and below usually indicate a poorer quality of life in these areas.  A difference of  2-3 points is a clinically meaningful  difference.  A difference of 2-3 points in the total score of the  Quality of Life Index has been associated with significant improvement in overall quality of life, self-image, physical symptoms, and general health in studies assessing change in quality of life.  PHQ-9: Recent Review Flowsheet Data    Depression screen [facility] 2/9 04/30/2020   Decreased Interest 1   Down, Depressed, Hopeless 1   PHQ - 2 Score 2   Altered sleeping 0   Tired, decreased energy 3   Change in appetite 1   Feeling bad or failure about yourself  0   Trouble concentrating 0   Moving slowly or fidgety/restless 0   Suicidal thoughts 0   PHQ-9 Score 6   Difficult doing work/chores Not difficult at all     Interpretation of Total Score  Total Score Depression Severity:  1-4 = Minimal depression, 5-9 = Mild depression, 10-14 = Moderate depression, 15-19 = Moderately severe depression, 20-27 = Severe depression   Psychosocial Evaluation and Intervention:   Psychosocial Re-Evaluation:   Psychosocial Discharge (Final Psychosocial Re-Evaluation):   Vocational Rehabilitation: Provide vocational rehab assistance to qualifying candidates.   Vocational Rehab Evaluation & Intervention:   Education: Education Goals: Education classes will be provided on a variety of topics geared toward better understanding of heart health and risk factor modification. Participant will state understanding/return demonstration of topics presented as noted by education test scores.  Learning Barriers/Preferences:  Learning Barriers/Preferences - 04/18/20 0911      Learning Barriers/Preferences   Learning Barriers None    Learning Preferences None           General Cardiac Education Topics:  AED/CPR: - Group verbal and written instruction with the use of models to demonstrate the basic use of the AED with the basic ABC's of resuscitation.   Anatomy and Cardiac Procedures: - Group verbal and visual presentation and models provide information about basic cardiac anatomy and function.  Reviews the testing methods done to diagnose heart disease and the outcomes of the test results. Describes the treatment choices: Medical Management, Angioplasty, or Coronary Bypass Surgery for treating various heart conditions including Myocardial Infarction, Angina, Valve Disease, and Cardiac Arrhythmias.  Written material given at graduation. Flowsheet Row Cardiac Rehab from 04/30/2020 in [facility] Cardiac and Pulmonary Rehab  Education need identified 04/30/20      Medication Safety: - Group verbal and visual instruction to review commonly prescribed medications for heart and lung disease. Reviews the medication, class of the drug, and side effects. Includes the steps to properly store meds and maintain the prescription regimen.  Written material given at graduation.   Intimacy: - Group verbal instruction through game format to discuss how heart and lung disease can affect sexual intimacy. Written material given at graduation..   Know Your Numbers and Heart Failure: - Group verbal and visual instruction to discuss disease risk factors for cardiac and pulmonary disease and treatment options.  Reviews associated critical values for Overweight/Obesity, Hypertension, Cholesterol, and Diabetes.  Discusses basics of heart failure: signs/symptoms and treatments.  Introduces Heart Failure Zone chart for action plan for heart failure.  Written material given at graduation. Flowsheet Row Cardiac Rehab from 04/30/2020 in [facility] Cardiac and Pulmonary Rehab  Education need identified 04/30/20      Infection Prevention: - Provides verbal and written material to individual with discussion of infection control including proper hand washing and proper equipment cleaning during exercise session. Flowsheet Row Cardiac Rehab from 04/30/2020 in [facility] Cardiac and Pulmonary Rehab  Date 04/18/20  Educator [facility]  Instruction Review Code 1- Verbalizes Understanding      Falls Prevention: - Provides verbal and written  material to individual with discussion of falls prevention and safety. Flowsheet Row Cardiac Rehab from 04/30/2020 in [facility] Cardiac and Pulmonary Rehab  Date 04/18/20  Educator [facility]  Instruction Review Code 1- Verbalizes Understanding      Other: -Provides group and verbal instruction on various topics (see comments)   Knowledge Questionnaire Score:  Knowledge Questionnaire Score - 04/30/20 1421      Knowledge Questionnaire Score   Pre Score 21/26           Core Components/Risk Factors/Patient Goals at Admission:  Personal Goals and Risk Factors at Admission - 04/30/20 1421      Core Components/Risk Factors/Patient Goals on Admission    Weight Management Yes;Weight Loss;Obesity    Intervention Weight Management: Develop a combined nutrition and exercise program designed to reach desired caloric intake, while maintaining appropriate intake of nutrient and fiber, sodium and fats, and appropriate energy expenditure required for the weight goal.;Weight Management: Provide education and appropriate resources to help participant work on and attain dietary goals.;Weight Management/Obesity: Establish reasonable short term and long term weight goals.;Obesity: Provide education and appropriate resources to help participant work on and attain dietary goals.    Admit Weight 230 lb 9.6 oz (104.6 kg)    Goal Weight: Short Term 225 lb (102.1 kg)    Goal Weight: Long Term 220 lb (99.8 kg)    Expected Outcomes Short Term: Continue to assess and modify interventions until short term weight is achieved;Long Term: Adherence to nutrition and physical activity/exercise program aimed toward attainment of established weight goal;Weight Loss: Understanding of general recommendations for a balanced deficit meal plan, which promotes 1-2 lb weight loss per week and includes a negative energy balance of [phone] kcal/d;Understanding recommendations for meals to include 15-35% energy as protein, 25-35% energy from fat,  35-60% energy from carbohydrates, less than 200mg  of dietary cholesterol, 20-35 gm of total fiber daily;Understanding of distribution of calorie intake throughout the day with the consumption of 4-5 meals/snacks    Hypertension Yes    Intervention Provide education on lifestyle modifcations including regular physical activity/exercise, weight management, moderate sodium restriction and increased consumption of fresh fruit, vegetables, and low fat dairy, alcohol moderation, and smoking cessation.;Monitor prescription use compliance.    Expected Outcomes Short Term: Continued assessment and intervention until BP is < 140/74mm HG in hypertensive participants. < 130/21mm HG in hypertensive participants with diabetes, heart failure or chronic kidney disease.;Long Term: Maintenance of blood pressure at goal levels.    Lipids Yes    Intervention Provide education and support for participant on nutrition & aerobic/resistive exercise along with prescribed medications to achieve LDL 70mg , HDL >40mg .    Expected Outcomes Short Term: Participant states understanding of desired cholesterol values and is compliant with medications prescribed. Participant is following exercise prescription and nutrition guidelines.;Long Term: Cholesterol controlled with medications as prescribed, with individualized exercise RX and with personalized nutrition plan. Value goals: LDL < 70mg , HDL > 40 mg.           Education:Diabetes - Individual verbal and written instruction to review signs/symptoms of diabetes, desired ranges of glucose level fasting, after meals and with exercise. Acknowledge that pre and post exercise glucose checks will be done for 3 sessions at entry of program.   Core Components/Risk Factors/Patient Goals Review:    Core Components/Risk Factors/Patient Goals at Discharge (Final Review):  ITP Comments:  ITP Comments    Row Name 04/18/20 0914 04/30/20 1345         ITP Comments Virtual Visit  completed. Patient informed on EP and RD appointment and 6 Minute walk test. Patient also informed of patient health questionnaires on My Chart. Patient Verbalizes understanding. Visit diagnosis can be found in [facility] 04/04/2020. Completed 6MWT and gym orientation. Initial ITP created and sent for review to Dr. Emily Filbert, Medical Director.             Comments: Initial ITP

## 2020-04-30 NOTE — Patient Instructions (Signed)
Patient Instructions  Patient Details  Name: Spencer Herring MRN: [id number] Date of Birth: 1955/08/11 Referring Provider:  Unk [place], [place] are your personal goals for exercise, nutrition, and risk factors. Our goal is to help you stay on track towards obtaining and maintaining these goals. We will be discussing your progress on these goals with you throughout the program.  Initial Exercise Prescription:  Initial Exercise Prescription - 04/30/20 1400      Date of Initial Exercise RX and Referring Provider   Date 04/30/20    Referring Provider de Dow Adolph, Arvid Right MD      Treadmill   MPH 2.2    Grade 0.5    Minutes 15    METs 2.84      REL-XR   Level 1    Speed 50    Minutes 15    METs 2.5      T5 Nustep   Level 2    SPM 80    Minutes 15    METs 2.5      Prescription Details   Frequency (times per week) 3    Duration Progress to 30 minutes of continuous aerobic without signs/symptoms of physical distress      Intensity   THRR 40-80% of Max Heartrate 102-139    Ratings of Perceived Exertion 11-13    Perceived Dyspnea 0-4      Progression   Progression Continue to progress workloads to maintain intensity without signs/symptoms of physical distress.      Resistance Training   Training Prescription Yes    Weight 4 lb    Reps 10-15           Exercise Goals: Frequency: Be able to perform aerobic exercise two to three times per week in program working toward 2-5 days per week of home exercise.  Intensity: Work with a perceived exertion of 11 (fairly light) - 15 (hard) while following your exercise prescription.  We will make changes to your prescription with you as you progress through the program.   Duration: Be able to do 30 to 45 minutes of continuous aerobic exercise in addition to a 5 minute warm-up and a 5 minute cool-down routine.   Nutrition Goals: Your personal nutrition goals will be established when you do your nutrition  analysis with the dietician.  The following are general nutrition guidelines to follow: Cholesterol < 200mg /day Sodium < 1500mg /day Fiber: Men over 50 yrs - 30 grams per day  Personal Goals:  Personal Goals and Risk Factors at Admission - 04/30/20 1421      Core Components/Risk Factors/Patient Goals on Admission    Weight Management Yes;Weight Loss;Obesity    Intervention Weight Management: Develop a combined nutrition and exercise program designed to reach desired caloric intake, while maintaining appropriate intake of nutrient and fiber, sodium and fats, and appropriate energy expenditure required for the weight goal.;Weight Management: Provide education and appropriate resources to help participant work on and attain dietary goals.;Weight Management/Obesity: Establish reasonable short term and long term weight goals.;Obesity: Provide education and appropriate resources to help participant work on and attain dietary goals.    Admit Weight 230 lb 9.6 oz (104.6 kg)    Goal Weight: Short Term 225 lb (102.1 kg)    Goal Weight: Long Term 220 lb (99.8 kg)    Expected Outcomes Short Term: Continue to assess and modify interventions until short term weight is achieved;Long Term: Adherence to nutrition and physical activity/exercise program aimed toward attainment of  established weight goal;Weight Loss: Understanding of general recommendations for a balanced deficit meal plan, which promotes 1-2 lb weight loss per week and includes a negative energy balance of [phone] kcal/d;Understanding recommendations for meals to include 15-35% energy as protein, 25-35% energy from fat, 35-60% energy from carbohydrates, less than 200mg  of dietary cholesterol, 20-35 gm of total fiber daily;Understanding of distribution of calorie intake throughout the day with the consumption of 4-5 meals/snacks    Hypertension Yes    Intervention Provide education on lifestyle modifcations including regular physical  activity/exercise, weight management, moderate sodium restriction and increased consumption of fresh fruit, vegetables, and low fat dairy, alcohol moderation, and smoking cessation.;Monitor prescription use compliance.    Expected Outcomes Short Term: Continued assessment and intervention until BP is < 140/74mm HG in hypertensive participants. < 130/28mm HG in hypertensive participants with diabetes, heart failure or chronic kidney disease.;Long Term: Maintenance of blood pressure at goal levels.    Lipids Yes    Intervention Provide education and support for participant on nutrition & aerobic/resistive exercise along with prescribed medications to achieve LDL 70mg , HDL >40mg .    Expected Outcomes Short Term: Participant states understanding of desired cholesterol values and is compliant with medications prescribed. Participant is following exercise prescription and nutrition guidelines.;Long Term: Cholesterol controlled with medications as prescribed, with individualized exercise RX and with personalized nutrition plan. Value goals: LDL < 70mg , HDL > 40 mg.           Tobacco Use Initial Evaluation: Social History   Tobacco Use  Smoking Status Former Smoker  . Packs/day: 3.00  . Years: 48.00  . Pack years: 144.00  . Types: Cigarettes  . Quit date: 04/29/1999  . Years since quitting: 21.0  Smokeless Tobacco Never Used    Exercise Goals and Review:  Exercise Goals    Row Name 04/30/20 1419             Exercise Goals   Increase Physical Activity Yes       Intervention Develop an individualized exercise prescription for aerobic and resistive training based on initial evaluation findings, risk stratification, comorbidities and participant's personal goals.;Provide advice, education, support and counseling about physical activity/exercise needs.       Expected Outcomes Short Term: Attend rehab on a regular basis to increase amount of physical activity.;Long Term: Add in home exercise to  make exercise part of routine and to increase amount of physical activity.;Long Term: Exercising regularly at least 3-5 days a week.       Increase Strength and Stamina Yes       Intervention Provide advice, education, support and counseling about physical activity/exercise needs.;Develop an individualized exercise prescription for aerobic and resistive training based on initial evaluation findings, risk stratification, comorbidities and participant's personal goals.       Expected Outcomes Short Term: Perform resistance training exercises routinely during rehab and add in resistance training at home;Short Term: Increase workloads from initial exercise prescription for resistance, speed, and METs.;Long Term: Improve cardiorespiratory fitness, muscular endurance and strength as measured by increased METs and functional capacity (6MWT)       Able to understand and use rate of perceived exertion (RPE) scale Yes       Intervention Provide education and explanation on how to use RPE scale       Expected Outcomes Long Term:  Able to use RPE to guide intensity level when exercising independently;Short Term: Able to use RPE daily in rehab to express subjective intensity level  Able to understand and use Dyspnea scale Yes       Intervention Provide education and explanation on how to use Dyspnea scale       Expected Outcomes Short Term: Able to use Dyspnea scale daily in rehab to express subjective sense of shortness of breath during exertion;Long Term: Able to use Dyspnea scale to guide intensity level when exercising independently       Knowledge and understanding of Target Heart Rate Range (THRR) Yes       Intervention Provide education and explanation of THRR including how the numbers were predicted and where they are located for reference       Expected Outcomes Short Term: Able to state/look up THRR;Short Term: Able to use daily as guideline for intensity in rehab;Long Term: Able to use THRR to govern  intensity when exercising independently       Able to check pulse independently Yes       Intervention Provide education and demonstration on how to check pulse in carotid and radial arteries.;Review the importance of being able to check your own pulse for safety during independent exercise       Expected Outcomes Short Term: Able to explain why pulse checking is important during independent exercise;Long Term: Able to check pulse independently and accurately       Understanding of Exercise Prescription Yes       Intervention Provide education, explanation, and written materials on patient's individual exercise prescription       Expected Outcomes Short Term: Able to explain program exercise prescription;Long Term: Able to explain home exercise prescription to exercise independently              Copy of goals given to participant.

## 2020-05-08 ENCOUNTER — Other Ambulatory Visit: Payer: Self-pay

## 2020-05-08 ENCOUNTER — Encounter: Payer: 59 | Admitting: *Deleted

## 2020-05-08 DIAGNOSIS — I208 Other forms of angina pectoris: Secondary | ICD-10-CM | POA: Diagnosis not present

## 2020-05-08 NOTE — Progress Notes (Signed)
Daily Session Note  Patient Details  Name: Spencer Herring MRN: [id number] Date of Birth: 08/08/1955 Referring Provider:   Flowsheet Row Cardiac Rehab from 04/30/2020 in [facility]  Referring Provider de Dow Adolph, Arvid Right MD      Encounter Date: 05/08/2020  Check In:  Session Check In - 05/08/20 1127      Check-In   Supervising physician immediately available to respond to emergencies See telemetry face sheet for immediately available ER MD    Location [facility]-Cardiac & Pulmonary Rehab    Staff Present Heath Lark, RN, BSN, CCRP;Amanda Sommer, BA, ACSM CEP, Exercise Physiologist;Melissa Caiola RDN, LDN;Joseph  [place]    Virtual Visit No    Medication changes reported     No    Fall or balance concerns reported    No    Warm-up and Cool-down Performed on first and last piece of equipment    Resistance Training Performed Yes    VAD Patient? No    PAD/SET Patient? No      Pain Assessment   Currently in Pain? No/denies              Social History   Tobacco Use  Smoking Status Former Smoker  . Packs/day: 3.00  . Years: 48.00  . Pack years: 144.00  . Types: Cigarettes  . Quit date: 04/29/1999  . Years since quitting: 21.0  Smokeless Tobacco Never Used    Goals Met:  Exercise tolerated well Personal goals reviewed No report of cardiac concerns or symptoms  Goals Unmet:  Not Applicable  Comments: First full day of exercise!  Patient was oriented to gym and equipment including functions, settings, policies, and procedures.  Patient's individual exercise prescription and treatment plan were reviewed.  All starting workloads were established based on the results of the 6 minute walk test done at initial orientation visit.  The plan for exercise progression was also introduced and progression will be customized based on patient's performance and goals.  Azarian experienced 5/10 angina while walking into building today. It resolved  quickly.  No angina symptoms reported during session today.  Dr. Emily Filbert is Medical Director for [facility].

## 2020-05-10 ENCOUNTER — Other Ambulatory Visit: Payer: Self-pay

## 2020-05-10 DIAGNOSIS — I208 Other forms of angina pectoris: Secondary | ICD-10-CM | POA: Diagnosis not present

## 2020-05-10 NOTE — Progress Notes (Signed)
Daily Session Note  Patient Details  Name: Spencer Herring MRN: [id number] Date of Birth: 05/13/1955 Referring Provider:   Flowsheet Row Cardiac Rehab from 04/30/2020 in [facility] Cardiac and Pulmonary Re[facility]  Referring Provider de Azevedo Filho, Clerio Francisco MD      Encounter Date: 05/10/2020  Check In:  Session Check In - 05/10/20 1039      Check-In   Supervising physician immediately available to respond to emergencies See telemetry face sheet for immediately available ER MD    Location [facility]-Cardiac & Pulmonary Rehab    Staff Present Kelly Bollinger, MPA, RN;Amanda Sommer, BA, ACSM CEP, Exercise Physiologist;Kara Langdon, MS Exercise Physiologist    Virtual Visit No    Medication changes reported     No    Fall or balance concerns reported    No    Warm-up and Cool-down Performed on first and last piece of equipment    Resistance Training Performed Yes    VAD Patient? No    PAD/SET Patient? No      Pain Assessment   Currently in Pain? No/denies              Social History   Tobacco Use  Smoking Status Former Smoker  . Packs/day: 3.00  . Years: 48.00  . Pack years: 144.00  . Types: Cigarettes  . Quit date: 04/29/1999  . Years since quitting: 21.0  Smokeless Tobacco Never Used    Goals Met:  Independence with exercise equipment Exercise tolerated well No report of cardiac concerns or symptoms Strength training completed today  Goals Unmet:  Not Applicable  Comments: Pt able to follow exercise prescription today without complaint.  Will continue to monitor for progression.    Dr. Mark Miller is Medical Director for [facility] and [facility]. 

## 2020-05-21 ENCOUNTER — Encounter: Payer: Self-pay | Admitting: *Deleted

## 2020-05-21 NOTE — Progress Notes (Signed)
Discharge Progress Report  Patient Details  Name: Spencer Herring MRN: [id number] Date of Birth: 11-18-1955 Referring Provider:   Flowsheet Row Cardiac Rehab from 04/30/2020 in [facility] Cardiac and Pulmonary Rehab  Referring Provider de Dow Adolph, Arvid Right MD       Number of Visits: 3  Reason for Discharge:  Early Exit:  Insurance - program is too expensive  Smoking History:  Social History   Tobacco Use  Smoking Status Former Smoker  . Packs/day: 3.00  . Years: 48.00  . Pack years: 144.00  . Types: Cigarettes  . Quit date: 04/29/1999  . Years since quitting: 21.0  Smokeless Tobacco Never Used    Diagnosis:  No diagnosis found.  ADL UCSD:   Initial Exercise Prescription:  Initial Exercise Prescription - 04/30/20 1400      Date of Initial Exercise RX and Referring Provider   Date 04/30/20    Referring Provider de Dow Adolph, Arvid Right MD      Treadmill   MPH 2.2    Grade 0.5    Minutes 15    METs 2.84      REL-XR   Level 1    Speed 50    Minutes 15    METs 2.5      T5 Nustep   Level 2    SPM 80    Minutes 15    METs 2.5      Prescription Details   Frequency (times per week) 3    Duration Progress to 30 minutes of continuous aerobic without signs/symptoms of physical distress      Intensity   THRR 40-80% of Max Heartrate 102-139    Ratings of Perceived Exertion 11-13    Perceived Dyspnea 0-4      Progression   Progression Continue to progress workloads to maintain intensity without signs/symptoms of physical distress.      Resistance Training   Training Prescription Yes    Weight 4 lb    Reps 10-15           Discharge Exercise Prescription (Final Exercise Prescription Changes):  Exercise Prescription Changes - 04/30/20 1400      Response to Exercise   Blood Pressure (Admit) 122/58    Blood Pressure (Exercise) 136/76    Blood Pressure (Exit) 122/60    Heart Rate (Admit) 65 bpm    Heart Rate (Exercise) 100 bpm     Heart Rate (Exit) 66 bpm    Oxygen Saturation (Admit) 95 %    Oxygen Saturation (Exercise) 93 %    Rating of Perceived Exertion (Exercise) 13    Perceived Dyspnea (Exercise) 1    Symptoms SOB    Comments walk test results           Functional Capacity:  6 Minute Walk    Row Name 04/30/20 1350         6 Minute Walk   Phase Initial     Distance 1290 feet     Walk Time 6 minutes     # of Rest Breaks 0     MPH 2.44     METS 2.72     RPE 13     Perceived Dyspnea  1     VO2 Peak 9.53     Symptoms Yes (comment)     Comments SOB     Resting HR 65 bpm     Resting BP 122/58     Resting Oxygen Saturation  95 %  Exercise Oxygen Saturation  during 6 min walk 93 %     Max Ex. HR 100 bpm     Max Ex. BP 136/76     2 Minute Post BP 122/60            Psychological, QOL, Others - Outcomes: PHQ 2/9: Depression screen PHQ 2/9 04/30/2020  Decreased Interest 1  Down, Depressed, Hopeless 1  PHQ - 2 Score 2  Altered sleeping 0  Tired, decreased energy 3  Change in appetite 1  Feeling bad or failure about yourself  0  Trouble concentrating 0  Moving slowly or fidgety/restless 0  Suicidal thoughts 0  PHQ-9 Score 6  Difficult doing work/chores Not difficult at all    Quality of Life:  Quality of Life - 04/30/20 1421      Quality of Life   Select Quality of Life      Quality of Life Scores   Health/Function Pre 22.14 %    Socioeconomic Pre 24.14 %    Psych/Spiritual Pre 22.79 %    Family Pre 26 %    GLOBAL Pre 23.11 %           Nutrition & Weight - Outcomes:  Pre Biometrics - 04/30/20 1420      Pre Biometrics   Height 5\' 5"  (1.651 m)    Weight 230 lb 9.6 oz (104.6 kg)    BMI (Calculated) 38.37    Single Leg Stand 1.3 seconds            Nutrition:  Nutrition Therapy & Goals - 04/30/20 1001      Nutrition Therapy   Diet Heart healthy, low Na    Protein (specify units) 85g    Fiber 30 grams    Whole Grain Foods 3 servings    Saturated Fats 12 max.  grams    Fruits and Vegetables 8 servings/day    Sodium 1.5 grams      Personal Nutrition Goals   Nutrition Goal ST: increase vareity of fruit and vegetable intake LT: maintain healthy changes    Comments B 5am: biscuit (sausage) or usually oatmeal. S: banana and strawberry yogurt.  L 11am: frozen "healthy" meals - usually witch chicken. D: cooks - grilled chicken, hamburger, beans, corn, bags of steamed vegetables and seasoning salt. Drinks: grape juice and water. 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 Discussed heart healthy eating. Lovis has no concerns at this time.      Intervention Plan   Intervention Prescribe, educate and counsel regarding individualized specific dietary modifications aiming towards targeted core components such as weight, hypertension, lipid management, diabetes, heart failure and other comorbidities.;Nutrition handout(s) given to patient.    Expected Outcomes Short Term Goal: Understand basic principles of dietary content, such as calories, fat, sodium, cholesterol and nutrients.;Short Term Goal: A plan has been developed with personal nutrition goals set during dietitian appointment.;Long Term Goal: Adherence to prescribed nutrition plan.           Education Questionnaire Score:  Knowledge Questionnaire Score - 04/30/20 1421      Knowledge Questionnaire Score   Pre Score 21/26           Goals reviewed with patient; copy given to patient.

## 2020-05-21 NOTE — Progress Notes (Signed)
Cardiac Individual Treatment Plan  Patient Details  Name: Spencer Herring MRN: [id number] Date of Birth: 1955-05-16 Referring Provider:   Flowsheet Row Cardiac Rehab from 04/30/2020 in [facility] Cardiac and Pulmonary Rehab  Referring Provider de Dow Adolph, Arvid Right MD      Initial Encounter Date:  Flowsheet Row Cardiac Rehab from 04/30/2020 in [facility] Cardiac and Pulmonary Rehab  Date 04/30/20      Visit Diagnosis: No diagnosis found.  Patient's Home Medications on Admission:  Current Outpatient Medications:  .  albuterol (ACCUNEB) 0.63 MG/3ML nebulizer solution, Take 1 ampule by nebulization every 4 (four) hours as needed for wheezing., Disp: , Rfl:  .  albuterol (VENTOLIN HFA) 108 (90 Base) MCG/ACT inhaler, Ventolin HFA 90 mcg/actuation aerosol inhaler  INHALE 2 INHALATIONS INTO THE LUNGS EVERY 4 (FOUR) HOURS AS NEEDED FOR WHEEZING, Disp: , Rfl:  .  ALPRAZolam (XANAX) 0.25 MG tablet, Take 0.25 mg by mouth at bedtime as needed for anxiety., Disp: , Rfl:  .  amLODipine (NORVASC) 2.5 MG tablet, Take 1 tablet (2.5 mg total) by mouth daily., Disp: 90 tablet, Rfl: 4 .  ascorbic acid (VITAMIN C) 250 MG tablet, Take 250 mg by mouth daily., Disp: , Rfl:  .  aspirin 81 MG chewable tablet, Chew 1 tablet (81 mg total) by mouth daily., Disp: 90 tablet, Rfl: 4 .  atorvastatin (LIPITOR) 40 MG tablet, Take 1 tablet (40 mg total) by mouth daily., Disp: 90 tablet, Rfl: 4 .  budesonide-formoterol (SYMBICORT) 160-4.5 MCG/ACT inhaler, Inhale 2 puffs into the lungs daily as needed (SHORTNESS OF BREATH). , Disp: , Rfl:  .  carvedilol (COREG) 6.25 MG tablet, Take by mouth., Disp: , Rfl:  .  cyclobenzaprine (FLEXERIL) 5 MG tablet, Take 5 mg by mouth as needed. (Patient not taking: Reported on 04/18/2020), Disp: , Rfl:  .  DULoxetine (CYMBALTA) 30 MG capsule, Take by mouth daily. (Patient not taking: Reported on 04/18/2020), Disp: , Rfl:  .  ferrous sulfate 325 (65 FE) MG tablet, Take 325 mg by mouth 2 (two)  times daily with a meal. , Disp: , Rfl:  .  gabapentin (NEURONTIN) 300 MG capsule, Take 300 mg by mouth at bedtime., Disp: , Rfl:  .  Influenza Virus Vaccine Split SUSP, Fluzone 2013-2014 45 mcg (15 mcg x 3)/0.5 mL intramuscular suspension  TO BE ADMINISTERED BY PHARMACIST FOR IMMUNIZATION, Disp: , Rfl:  .  isosorbide mononitrate (IMDUR) 30 MG 24 hr tablet, , Disp: , Rfl:  .  losartan (COZAAR) 25 MG tablet, Take 1 tablet by mouth daily., Disp: , Rfl:  .  metoprolol succinate (TOPROL-XL) 25 MG 24 hr tablet, Take 25 mg by mouth daily., Disp: , Rfl:  .  Multiple Vitamin (MULTIVITAMIN) tablet, Take 1 tablet by mouth daily., Disp: , Rfl:  .  nitroGLYCERIN (NITROSTAT) 0.4 MG SL tablet, Place 0.4 mg under the tongue every 5 (five) minutes as needed. , Disp: , Rfl:  .  oxyCODONE (OXY IR/ROXICODONE) 5 MG immediate release tablet, oxycodone 5 mg tablet  TAKE ONE TABLET BY MOUTH EVERY 6 HOURS, Disp: , Rfl:  .  oxyCODONE-acetaminophen (PERCOCET) 7.5-325 MG per tablet, Take 1 tablet by mouth 2 (two) times daily as needed for severe pain., Disp: , Rfl:  .  pantoprazole (PROTONIX) 40 MG tablet, Take by mouth., Disp: , Rfl:  .  potassium chloride (KLOR-CON) 10 MEQ tablet, Take 10 mEq by mouth daily. , Disp: , Rfl:  .  predniSONE (STERAPRED UNI-PAK 21 TAB) 5 MG (21) TBPK  tablet, TAKE 6 TABLETS ON DAY 1 AS DIRECTED ON PACKAGE AND DECREASE BY 1 TAB EACH DAY FOR A TOTAL OF 6 DAYS (Patient not taking: Reported on 04/18/2020), Disp: , Rfl:  .  tamsulosin (FLOMAX) 0.4 MG CAPS capsule, , Disp: , Rfl:  .  telmisartan (MICARDIS) 20 MG tablet, TAKE 1 TABLET BY MOUTH EVERY DAY, Disp: , Rfl:  .  torsemide (DEMADEX) 20 MG tablet, TAKE 1 TABLET BY MOUTH EVERY DAY, Disp: , Rfl:  .  traMADol (ULTRAM) 50 MG tablet, Take 50 mg by mouth every 12 (twelve) hours as needed for moderate pain., Disp: , Rfl:  .  vitamin E 180 MG (400 UNITS) capsule, Take by mouth., Disp: , Rfl:   Past Medical History: Past Medical History:  Diagnosis  Date  . Anemia   . Asthma   . COPD (chronic obstructive pulmonary disease) ([place])   . Morbid obesity ([place])   . Neuromuscular disorder ([place])   . Sleep apnea     Tobacco Use: Social History   Tobacco Use  Smoking Status Former Smoker  . Packs/day: 3.00  . Years: 48.00  . Pack years: 144.00  . Types: Cigarettes  . Quit date: 04/29/1999  . Years since quitting: 21.0  Smokeless Tobacco Never Used    Labs: Recent Review Scientist, physiological    Labs for ITP Cardiac and Pulmonary Rehab Latest Ref Rng & Units 10/15/2012   Hemoglobin A1c 4.2 - 6.3 % 5.6       Exercise Target Goals: Exercise Program Goal: Individual exercise prescription set using results from initial 6 min walk test and THRR while considering  patient's activity barriers and safety.   Exercise Prescription Goal: Initial exercise prescription builds to 30-45 minutes a day of aerobic activity, 2-3 days per week.  Home exercise guidelines will be given to patient during program as part of exercise prescription that the participant will acknowledge.   Education: Aerobic Exercise: - Group verbal and visual presentation on the components of exercise prescription. Introduces F.I.T.T principle from ACSM for exercise prescriptions.  Reviews F.I.T.T. principles of aerobic exercise including progression. Written material given at graduation. Flowsheet Row Cardiac Rehab from 05/10/2020 in [facility] Cardiac and Pulmonary Rehab  Education need identified 04/30/20      Education: Resistance Exercise: - Group verbal and visual presentation on the components of exercise prescription. Introduces F.I.T.T principle from ACSM for exercise prescriptions  Reviews F.I.T.T. principles of resistance exercise including progression. Written material given at graduation.    Education: Exercise & Equipment Safety: - Individual verbal instruction and demonstration of equipment use and safety with use of the equipment. Flowsheet Row Cardiac Rehab from  05/10/2020 in [facility] Cardiac and Pulmonary Rehab  Date 04/18/20  Educator [facility]  Instruction Review Code 1- Verbalizes Understanding      Education: Exercise Physiology & General Exercise Guidelines: - Group verbal and written instruction with models to review the exercise physiology of the cardiovascular system and associated critical values. Provides general exercise guidelines with specific guidelines to those with heart or lung disease.    Education: Flexibility, Balance, Mind/Body Relaxation: - Group verbal and visual presentation with interactive activity on the components of exercise prescription. Introduces F.I.T.T principle from ACSM for exercise prescriptions. Reviews F.I.T.T. principles of flexibility and balance exercise training including progression. Also discusses the mind body connection.  Reviews various relaxation techniques to help reduce and manage stress (i.e. Deep breathing, progressive muscle relaxation, and visualization). Balance handout provided to take home. Written material given at graduation.  Activity Barriers & Risk Stratification:  Activity Barriers & Cardiac Risk Stratification - 04/30/20 1410      Activity Barriers & Cardiac Risk Stratification   Activity Barriers Back Problems;Neck/Spine Problems;Shortness of Breath;Muscular Weakness;Deconditioning;Joint Problems;Balance Concerns   Pt has chronic pain in ankles/feet, legs, and back   Cardiac Risk Stratification Moderate           6 Minute Walk:  6 Minute Walk    Row Name 04/30/20 1350         6 Minute Walk   Phase Initial     Distance 1290 feet     Walk Time 6 minutes     # of Rest Breaks 0     MPH 2.44     METS 2.72     RPE 13     Perceived Dyspnea  1     VO2 Peak 9.53     Symptoms Yes (comment)     Comments SOB     Resting HR 65 bpm     Resting BP 122/58     Resting Oxygen Saturation  95 %     Exercise Oxygen Saturation  during 6 min walk 93 %     Max Ex. HR 100 bpm     Max Ex. BP  136/76     2 Minute Post BP 122/60            Oxygen Initial Assessment:   Oxygen Re-Evaluation:   Oxygen Discharge (Final Oxygen Re-Evaluation):   Initial Exercise Prescription:  Initial Exercise Prescription - 04/30/20 1400      Date of Initial Exercise RX and Referring Provider   Date 04/30/20    Referring Provider de Dow Adolph, Arvid Right MD      Treadmill   MPH 2.2    Grade 0.5    Minutes 15    METs 2.84      REL-XR   Level 1    Speed 50    Minutes 15    METs 2.5      T5 Nustep   Level 2    SPM 80    Minutes 15    METs 2.5      Prescription Details   Frequency (times per week) 3    Duration Progress to 30 minutes of continuous aerobic without signs/symptoms of physical distress      Intensity   THRR 40-80% of Max Heartrate 102-139    Ratings of Perceived Exertion 11-13    Perceived Dyspnea 0-4      Progression   Progression Continue to progress workloads to maintain intensity without signs/symptoms of physical distress.      Resistance Training   Training Prescription Yes    Weight 4 lb    Reps 10-15           Perform Capillary Blood Glucose checks as needed.  Exercise Prescription Changes:   Exercise Prescription Changes    Row Name 04/30/20 1400             Response to Exercise   Blood Pressure (Admit) 122/58       Blood Pressure (Exercise) 136/76       Blood Pressure (Exit) 122/60       Heart Rate (Admit) 65 bpm       Heart Rate (Exercise) 100 bpm       Heart Rate (Exit) 66 bpm       Oxygen Saturation (Admit) 95 %       Oxygen Saturation (Exercise)  93 %       Rating of Perceived Exertion (Exercise) 13       Perceived Dyspnea (Exercise) 1       Symptoms SOB       Comments walk test results              Exercise Comments:   Exercise Comments    Row Name 05/08/20 1130           Exercise Comments First full day of exercise!  Patient was oriented to gym and equipment including functions, settings,  policies, and procedures.  Patient's individual exercise prescription and treatment plan were reviewed.  All starting workloads were established based on the results of the 6 minute walk test done at initial orientation visit.  The plan for exercise progression was also introduced and progression will be customized based on patient's performance and goals.              Exercise Goals and Review:   Exercise Goals    Row Name 04/30/20 1419             Exercise Goals   Increase Physical Activity Yes       Intervention Develop an individualized exercise prescription for aerobic and resistive training based on initial evaluation findings, risk stratification, comorbidities and participant's personal goals.;Provide advice, education, support and counseling about physical activity/exercise needs.       Expected Outcomes Short Term: Attend rehab on a regular basis to increase amount of physical activity.;Long Term: Add in home exercise to make exercise part of routine and to increase amount of physical activity.;Long Term: Exercising regularly at least 3-5 days a week.       Increase Strength and Stamina Yes       Intervention Provide advice, education, support and counseling about physical activity/exercise needs.;Develop an individualized exercise prescription for aerobic and resistive training based on initial evaluation findings, risk stratification, comorbidities and participant's personal goals.       Expected Outcomes Short Term: Perform resistance training exercises routinely during rehab and add in resistance training at home;Short Term: Increase workloads from initial exercise prescription for resistance, speed, and METs.;Long Term: Improve cardiorespiratory fitness, muscular endurance and strength as measured by increased METs and functional capacity (6MWT)       Able to understand and use rate of perceived exertion (RPE) scale Yes       Intervention Provide education and explanation on how  to use RPE scale       Expected Outcomes Long Term:  Able to use RPE to guide intensity level when exercising independently;Short Term: Able to use RPE daily in rehab to express subjective intensity level       Able to understand and use Dyspnea scale Yes       Intervention Provide education and explanation on how to use Dyspnea scale       Expected Outcomes Short Term: Able to use Dyspnea scale daily in rehab to express subjective sense of shortness of breath during exertion;Long Term: Able to use Dyspnea scale to guide intensity level when exercising independently       Knowledge and understanding of Target Heart Rate Range (THRR) Yes       Intervention Provide education and explanation of THRR including how the numbers were predicted and where they are located for reference       Expected Outcomes Short Term: Able to state/look up THRR;Short Term: Able to use daily as guideline for intensity in rehab;Long Term:  Able to use THRR to govern intensity when exercising independently       Able to check pulse independently Yes       Intervention Provide education and demonstration on how to check pulse in carotid and radial arteries.;Review the importance of being able to check your own pulse for safety during independent exercise       Expected Outcomes Short Term: Able to explain why pulse checking is important during independent exercise;Long Term: Able to check pulse independently and accurately       Understanding of Exercise Prescription Yes       Intervention Provide education, explanation, and written materials on patient's individual exercise prescription       Expected Outcomes Short Term: Able to explain program exercise prescription;Long Term: Able to explain home exercise prescription to exercise independently              Exercise Goals Re-Evaluation :  Exercise Goals Re-Evaluation    Row Name 05/08/20 1130             Exercise Goal Re-Evaluation   Exercise Goals Review  Knowledge and understanding of Target Heart Rate Range (THRR);Able to understand and use Dyspnea scale;Able to understand and use rate of perceived exertion (RPE) scale;Understanding of Exercise Prescription       Comments Reviewed RPE and dyspnea scales, THR and program prescription with pt today.  Pt voiced understanding and was given a copy of goals to take home.       Expected Outcomes Short: Use RPE daily to regulate intensity. Long: Follow program prescription in THR.              Discharge Exercise Prescription (Final Exercise Prescription Changes):  Exercise Prescription Changes - 04/30/20 1400      Response to Exercise   Blood Pressure (Admit) 122/58    Blood Pressure (Exercise) 136/76    Blood Pressure (Exit) 122/60    Heart Rate (Admit) 65 bpm    Heart Rate (Exercise) 100 bpm    Heart Rate (Exit) 66 bpm    Oxygen Saturation (Admit) 95 %    Oxygen Saturation (Exercise) 93 %    Rating of Perceived Exertion (Exercise) 13    Perceived Dyspnea (Exercise) 1    Symptoms SOB    Comments walk test results           Nutrition:  Target Goals: Understanding of nutrition guidelines, daily intake of sodium 1500mg , cholesterol 200mg , calories 30% from fat and 7% or less from saturated fats, daily to have 5 or more servings of fruits and vegetables.  Education: All About Nutrition: -Group instruction provided by verbal, written material, interactive activities, discussions, models, and posters to present general guidelines for heart healthy nutrition including fat, fiber, MyPlate, the role of sodium in heart healthy nutrition, utilization of the nutrition label, and utilization of this knowledge for meal planning. Follow up email sent as well. Written material given at graduation. Flowsheet Row Cardiac Rehab from 05/10/2020 in [facility] Cardiac and Pulmonary Rehab  Date 05/10/20  Educator [facility]  Instruction Review Code 1- Verbalizes Understanding      Biometrics:  Pre Biometrics -  04/30/20 1420      Pre Biometrics   Height 5\' 5"  (1.651 m)    Weight 230 lb 9.6 oz (104.6 kg)    BMI (Calculated) 38.37    Single Leg Stand 1.3 seconds            Nutrition Therapy Plan and Nutrition Goals:  Nutrition  Therapy & Goals - 04/30/20 1001      Nutrition Therapy   Diet Heart healthy, low Na    Protein (specify units) 85g    Fiber 30 grams    Whole Grain Foods 3 servings    Saturated Fats 12 max. grams    Fruits and Vegetables 8 servings/day    Sodium 1.5 grams      Personal Nutrition Goals   Nutrition Goal ST: increase vareity of fruit and vegetable intake LT: maintain healthy changes    Comments B 5am: biscuit (sausage) or usually oatmeal. S: banana and strawberry yogurt.  L 11am: frozen "healthy" meals - usually witch chicken. D: cooks - grilled chicken, hamburger, beans, corn, bags of steamed vegetables and seasoning salt. Drinks: grape juice and water. 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 Discussed heart healthy eating. Jayven has no concerns at this time.      Intervention Plan   Intervention Prescribe, educate and counsel regarding individualized specific dietary modifications aiming towards targeted core components such as weight, hypertension, lipid management, diabetes, heart failure and other comorbidities.;Nutrition handout(s) given to patient.    Expected Outcomes Short Term Goal: Understand basic principles of dietary content, such as calories, fat, sodium, cholesterol and nutrients.;Short Term Goal: A plan has been developed with personal nutrition goals set during dietitian appointment.;Long Term Goal: Adherence to prescribed nutrition plan.           Nutrition Assessments:  MEDIFICTS Score Key:  ?70 Need to make dietary changes   40-70 Heart Healthy Diet  ? 40 Therapeutic Level Cholesterol Diet  Flowsheet Row Cardiac Rehab from 04/30/2020 in [facility] Cardiac and Pulmonary Rehab  Picture  Your Plate Total Score on Admission 80     Picture Your Plate Scores:  <58 Unhealthy dietary pattern with much room for improvement.  41-50 Dietary pattern unlikely to meet recommendations for good health and room for improvement.  51-60 More healthful dietary pattern, with some room for improvement.   >60 Healthy dietary pattern, although there may be some specific behaviors that could be improved.    Nutrition Goals Re-Evaluation:   Nutrition Goals Discharge (Final Nutrition Goals Re-Evaluation):   Psychosocial: Target Goals: Acknowledge presence or absence of significant depression and/or stress, maximize coping skills, provide positive support system. Participant is able to verbalize types and ability to use techniques and skills needed for reducing stress and depression.   Education: Stress, Anxiety, and Depression - Group verbal and visual presentation to define topics covered.  Reviews how body is impacted by stress, anxiety, and depression.  Also discusses healthy ways to reduce stress and to treat/manage anxiety and depression.  Written material given at graduation. Flowsheet Row Cardiac Rehab from 05/10/2020 in [facility] Cardiac and Pulmonary Rehab  Education need identified 04/30/20      Education: Sleep Hygiene -Provides group verbal and written instruction about how sleep can affect your health.  Define sleep hygiene, discuss sleep cycles and impact of sleep habits. Review good sleep hygiene tips.    Initial Review & Psychosocial Screening:  Initial Psych Review & Screening - 04/18/20 0911      Initial Review   Current issues with Current Sleep Concerns      Family Dynamics   Good Support System? Yes    Comments He can look to his three nieces for support. His family lives close to him and looks out for him and his health.  Barriers   Psychosocial barriers to participate in program The patient should benefit from training in stress management and  relaxation.;There are no identifiable barriers or psychosocial needs.      Screening Interventions   Interventions Encouraged to exercise;To provide support and resources with identified psychosocial needs;Provide feedback about the scores to participant    Expected Outcomes Short Term goal: Utilizing psychosocial counselor, staff and physician to assist with identification of specific Stressors or current issues interfering with healing process. Setting desired goal for each stressor or current issue identified.;Long Term Goal: Stressors or current issues are controlled or eliminated.;Short Term goal: Identification and review with participant of any Quality of Life or Depression concerns found by scoring the questionnaire.;Long Term goal: The participant improves quality of Life and PHQ9 Scores as seen by post scores and/or verbalization of changes           Quality of Life Scores:   Quality of Life - 04/30/20 1421      Quality of Life   Select Quality of Life      Quality of Life Scores   Health/Function Pre 22.14 %    Socioeconomic Pre 24.14 %    Psych/Spiritual Pre 22.79 %    Family Pre 26 %    GLOBAL Pre 23.11 %          Scores of 19 and below usually indicate a poorer quality of life in these areas.  A difference of  2-3 points is a clinically meaningful difference.  A difference of 2-3 points in the total score of the Quality of Life Index has been associated with significant improvement in overall quality of life, self-image, physical symptoms, and general health in studies assessing change in quality of life.  PHQ-9: Recent Review Flowsheet Data    Depression screen [facility] 2/9 04/30/2020   Decreased Interest 1   Down, Depressed, Hopeless 1   PHQ - 2 Score 2   Altered sleeping 0   Tired, decreased energy 3   Change in appetite 1   Feeling bad or failure about yourself  0   Trouble concentrating 0   Moving slowly or fidgety/restless 0   Suicidal thoughts 0   PHQ-9 Score 6    Difficult doing work/chores Not difficult at all     Interpretation of Total Score  Total Score Depression Severity:  1-4 = Minimal depression, 5-9 = Mild depression, 10-14 = Moderate depression, 15-19 = Moderately severe depression, 20-27 = Severe depression   Psychosocial Evaluation and Intervention:   Psychosocial Re-Evaluation:   Psychosocial Discharge (Final Psychosocial Re-Evaluation):   Vocational Rehabilitation: Provide vocational rehab assistance to qualifying candidates.   Vocational Rehab Evaluation & Intervention:   Education: Education Goals: Education classes will be provided on a variety of topics geared toward better understanding of heart health and risk factor modification. Participant will state understanding/return demonstration of topics presented as noted by education test scores.  Learning Barriers/Preferences:  Learning Barriers/Preferences - 04/18/20 0911      Learning Barriers/Preferences   Learning Barriers None    Learning Preferences None           General Cardiac Education Topics:  AED/CPR: - Group verbal and written instruction with the use of models to demonstrate the basic use of the AED with the basic ABC's of resuscitation.   Anatomy and Cardiac Procedures: - Group verbal and visual presentation and models provide information about basic cardiac anatomy and function. Reviews the testing methods done to diagnose heart disease and  the outcomes of the test results. Describes the treatment choices: Medical Management, Angioplasty, or Coronary Bypass Surgery for treating various heart conditions including Myocardial Infarction, Angina, Valve Disease, and Cardiac Arrhythmias.  Written material given at graduation. Flowsheet Row Cardiac Rehab from 05/10/2020 in [facility] Cardiac and Pulmonary Rehab  Education need identified 04/30/20      Medication Safety: - Group verbal and visual instruction to review commonly prescribed medications for  heart and lung disease. Reviews the medication, class of the drug, and side effects. Includes the steps to properly store meds and maintain the prescription regimen.  Written material given at graduation.   Intimacy: - Group verbal instruction through game format to discuss how heart and lung disease can affect sexual intimacy. Written material given at graduation..   Know Your Numbers and Heart Failure: - Group verbal and visual instruction to discuss disease risk factors for cardiac and pulmonary disease and treatment options.  Reviews associated critical values for Overweight/Obesity, Hypertension, Cholesterol, and Diabetes.  Discusses basics of heart failure: signs/symptoms and treatments.  Introduces Heart Failure Zone chart for action plan for heart failure.  Written material given at graduation. Flowsheet Row Cardiac Rehab from 05/10/2020 in [facility] Cardiac and Pulmonary Rehab  Education need identified 04/30/20      Infection Prevention: - Provides verbal and written material to individual with discussion of infection control including proper hand washing and proper equipment cleaning during exercise session. Flowsheet Row Cardiac Rehab from 05/10/2020 in [facility] Cardiac and Pulmonary Rehab  Date 04/18/20  Educator [facility]  Instruction Review Code 1- Verbalizes Understanding      Falls Prevention: - Provides verbal and written material to individual with discussion of falls prevention and safety. Flowsheet Row Cardiac Rehab from 05/10/2020 in [facility] Cardiac and Pulmonary Rehab  Date 04/18/20  Educator [facility]  Instruction Review Code 1- Verbalizes Understanding      Other: -Provides group and verbal instruction on various topics (see comments)   Knowledge Questionnaire Score:  Knowledge Questionnaire Score - 04/30/20 1421      Knowledge Questionnaire Score   Pre Score 21/26           Core Components/Risk Factors/Patient Goals at Admission:  Personal Goals and Risk Factors at  Admission - 04/30/20 1421      Core Components/Risk Factors/Patient Goals on Admission    Weight Management Yes;Weight Loss;Obesity    Intervention Weight Management: Develop a combined nutrition and exercise program designed to reach desired caloric intake, while maintaining appropriate intake of nutrient and fiber, sodium and fats, and appropriate energy expenditure required for the weight goal.;Weight Management: Provide education and appropriate resources to help participant work on and attain dietary goals.;Weight Management/Obesity: Establish reasonable short term and long term weight goals.;Obesity: Provide education and appropriate resources to help participant work on and attain dietary goals.    Admit Weight 230 lb 9.6 oz (104.6 kg)    Goal Weight: Short Term 225 lb (102.1 kg)    Goal Weight: Long Term 220 lb (99.8 kg)    Expected Outcomes Short Term: Continue to assess and modify interventions until short term weight is achieved;Long Term: Adherence to nutrition and physical activity/exercise program aimed toward attainment of established weight goal;Weight Loss: Understanding of general recommendations for a balanced deficit meal plan, which promotes 1-2 lb weight loss per week and includes a negative energy balance of [phone] kcal/d;Understanding recommendations for meals to include 15-35% energy as protein, 25-35% energy from fat, 35-60% energy from carbohydrates, less than 200mg  of dietary cholesterol,  20-35 gm of total fiber daily;Understanding of distribution of calorie intake throughout the day with the consumption of 4-5 meals/snacks    Hypertension Yes    Intervention Provide education on lifestyle modifcations including regular physical activity/exercise, weight management, moderate sodium restriction and increased consumption of fresh fruit, vegetables, and low fat dairy, alcohol moderation, and smoking cessation.;Monitor prescription use compliance.    Expected Outcomes Short Term:  Continued assessment and intervention until BP is < 140/91mm HG in hypertensive participants. < 130/44mm HG in hypertensive participants with diabetes, heart failure or chronic kidney disease.;Long Term: Maintenance of blood pressure at goal levels.    Lipids Yes    Intervention Provide education and support for participant on nutrition & aerobic/resistive exercise along with prescribed medications to achieve LDL 70mg , HDL >40mg .    Expected Outcomes Short Term: Participant states understanding of desired cholesterol values and is compliant with medications prescribed. Participant is following exercise prescription and nutrition guidelines.;Long Term: Cholesterol controlled with medications as prescribed, with individualized exercise RX and with personalized nutrition plan. Value goals: LDL < 70mg , HDL > 40 mg.           Education:Diabetes - Individual verbal and written instruction to review signs/symptoms of diabetes, desired ranges of glucose level fasting, after meals and with exercise. Acknowledge that pre and post exercise glucose checks will be done for 3 sessions at entry of program.   Core Components/Risk Factors/Patient Goals Review:    Core Components/Risk Factors/Patient Goals at Discharge (Final Review):    ITP Comments:  ITP Comments    Row Name 04/18/20 0914 04/30/20 1345 05/08/20 1130 05/21/20 1542     ITP Comments Virtual Visit completed. Patient informed on EP and RD appointment and 6 Minute walk test. Patient also informed of patient health questionnaires on My Chart. Patient Verbalizes understanding. Visit diagnosis can be found in [facility] 04/04/2020. Completed 6MWT and gym orientation. Initial ITP created and sent for review to Dr. Emily Fil[place], Medical Director. First full day of exercise!  Patient was oriented to gym and equipment including functions, settings, policies, and procedures.  Patient's individual exercise prescription and treatment plan were reviewed.  All  starting workloads were established based on the results of the 6 minute walk test done at initial orientation visit.  The plan for exercise progression was also introduced and progression will be customized based on patient's performance and goals. Patient having to be discharged from the program due to insurance costs.           Comments: Discharge ITP

## 2020-09-04 DIAGNOSIS — M150 Primary generalized (osteo)arthritis: Secondary | ICD-10-CM | POA: Insufficient documentation

## 2021-04-25 ENCOUNTER — Encounter: Payer: Self-pay | Admitting: Urgent Care

## 2021-07-26 ENCOUNTER — Other Ambulatory Visit: Payer: Self-pay

## 2021-07-26 ENCOUNTER — Ambulatory Visit
Admission: EM | Admit: 2021-07-26 | Discharge: 2021-07-26 | Disposition: A | Discharge status: Home / Self Care | Payer: 59

## 2021-07-26 ENCOUNTER — Ambulatory Visit (INDEPENDENT_AMBULATORY_CARE_PROVIDER_SITE_OTHER): Payer: 59

## 2021-07-26 ENCOUNTER — Encounter: Payer: Self-pay | Admitting: Urgent Care

## 2021-07-26 DIAGNOSIS — S8262XA Displaced fracture of lateral malleolus of left fibula, initial encounter for closed fracture: Secondary | ICD-10-CM | POA: Diagnosis not present

## 2021-07-26 DIAGNOSIS — M25562 Pain in left knee: Secondary | ICD-10-CM

## 2021-07-26 DIAGNOSIS — T07XXXA Unspecified multiple injuries, initial encounter: Secondary | ICD-10-CM

## 2021-07-26 DIAGNOSIS — M25572 Pain in left ankle and joints of left foot: Secondary | ICD-10-CM

## 2021-07-26 DIAGNOSIS — M1712 Unilateral primary osteoarthritis, left knee: Secondary | ICD-10-CM

## 2021-07-26 NOTE — Discharge Instructions (Addendum)
-  You have fractured your distal fibula or the outside of your ankle.  It is an intra-articular fracture so you will need to follow-up with orthopedics.  It has already been a week since the incident occurred so you should follow-up sooner than later, by early next week.  Until then, we have placed you in a walking boot.  Use this as a cast and sleep in it--propped up on pillows.  Try to stay off your lower extremities much as possible.  Elevate and ice it frequently.  Take your home medication for pain as needed. ? ?You have a condition requiring you to follow up with Orthopedics so please call one of the following office for appointment:  ? ?[facility] ?[address], [place], [place] [address] ?Phone: [phone] ? ?[facility] ?[address] [place],  [address] ?Phone: [phone]  ?

## 2021-07-26 NOTE — ED Provider Notes (Signed)
?[facility] ? ? ? ?CSN: [id number] ?Arrival date & time: 07/26/21  1227 ? ? ?  ? ?History   ?Chief Complaint ?Chief Complaint  ?Patient presents with  ? Ankle Pain  ?  Left   ? ? ?HPI ?Spencer Herring is a 66 y.o. male presenting for left ankle pain, left lower leg pain and left knee pain.  Patient says 6 days ago he slipped in mud and fell, twisting his ankle.  He has had significant swelling and bruising of his lower extremity since then and increased pain when trying to bear weight and walk.  He has not been evaluated for this condition.  He has been taking his home medications and trying to keep it elevated.  He denies any other injuries during the fall. ? ?Patient has history of chronic bilateral ankle pain. He is seen by pain management and podiatry.  Taken from podiatry note from last month, "he has multiple issues causing the pain. 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  Patient has also received corticosteroid injections into the ankles and diclofenac gel.  Also prescribed duloxetine and gabapentin.   ? ?HPI ? ?Past Medical History:  ?Diagnosis Date  ? Anemia   ? Asthma   ? COPD (chronic obstructive pulmonary disease) ([place])   ? Morbid obesity ([place])   ? Neuromuscular disorder ([place])   ? Sleep apnea   ? ? ?Patient Active Problem List  ? Diagnosis Date Noted  ? B12 deficiency 03/16/2019  ? Iron deficiency anemia following bariatric surgery 06/08/2018  ? Bilateral lower extremity edema 01/14/2018  ? Varicose veins of bilateral lower extremities with pain 01/14/2018  ? Morbid obesity ([place]) 12/15/2017  ? Pseudoaneurysm of femoral artery following procedure ([place]) 12/15/2017  ? CAD (coronary artery disease) 12/01/2017  ? Angina decubitus ([place]) 11/24/2017  ? ? ?Past Surgical History:  ?Procedure Laterality Date  ? BARIATRIC SURGERY N/A   ? COLONOSCOPY  N/A 04/02/2015  ? Procedure: COLONOSCOPY;  Surgeon: Josefine Class, MD;  Location: [facility] ENDOSCOPY;  Service: Endoscopy;  Laterality: N/A;  ? COLONOSCOPY WITH PROPOFOL N/A 12/01/2014  ? Procedure: COLONOSCOPY WITH PROPOFOL;  Surgeon: Josefine Class, MD;  Location: [facility] ENDOSCOPY;  Service: Endoscopy;  Laterality: N/A;  ? COLONOSCOPY WITH PROPOFOL N/A 10/27/2018  ? Procedure: COLONOSCOPY WITH PROPOFOL;  Surgeon: Toledo, Benay Pike, MD;  Location: [facility] ENDOSCOPY;  Service: Gastroenterology;  Laterality: N/A;  ? CORONARY STENT INTERVENTION N/A 12/01/2017  ? Procedure: CORONARY STENT INTERVENTION;  Surgeon: Isaias Cowman, MD;  Location: [facility] CV LAB;  Service: Cardiovascular;  Laterality: N/A;  ? ESOPHAGOGASTRODUODENOSCOPY (EGD) WITH PROPOFOL N/A 10/27/2018  ? Procedure: ESOPHAGOGASTRODUODENOSCOPY (EGD) WITH PROPOFOL;  Surgeon: Toledo, Benay Pike, MD;  Location: [facility] ENDOSCOPY;  Service: Gastroenterology;  Laterality: N/A;  ? LEFT HEART CATH AND CORONARY ANGIOGRAPHY Left 12/01/2017  ? Procedure: LEFT HEART CATH AND CORONARY ANGIOGRAPHY;  Surgeon: Corey Skains, MD;  Location: [facility] CV LAB;  Service: Cardiovascular;  Laterality: Left;  ? LOWER EXTREMITY ANGIOGRAPHY Right 12/17/2017  ? Procedure: LOWER EXTREMITY ANGIOGRAPHY;  Surgeon: Algernon Huxley, MD;  Location: [facility];  Service: Cardiovascular;  Laterality: Right;  ? PNEUMONECTOMY Left   ? ? ? ? ? ?Home Medications   ? ?Prior to Admission medications   ?Medication Sig Start Date End Date Taking? Authorizing Provider  ?albuterol (ACCUNEB) 0.63 MG/3ML nebulizer  solution Take 1 ampule by nebulization every 4 (four) hours as needed for wheezing.   Yes [provider]  ?albuterol (VENTOLIN HFA) 108 (90 Base) MCG/ACT inhaler Ventolin HFA 90 mcg/actuation aerosol inhaler ? INHALE 2 INHALATIONS INTO THE LUNGS EVERY 4 (FOUR) HOURS AS NEEDED FOR WHEEZING   Yes [provider]  ?ALPRAZolam (XANAX) 0.25 MG tablet Take 0.25 mg by  mouth at bedtime as needed for anxiety.   Yes [provider]  ?amLODipine (NORVASC) 2.5 MG tablet Take 1 tablet (2.5 mg total) by mouth daily. 12/01/17  Yes Corey Skains, MD  ?ascorbic acid (VITAMIN C) 250 MG tablet Take 250 mg by mouth daily.   Yes [provider]  ?aspirin 81 MG chewable tablet Chew 1 tablet (81 mg total) by mouth daily. 12/02/17  Yes Corey Skains, MD  ?atorvastatin (LIPITOR) 40 MG tablet Take 1 tablet (40 mg total) by mouth daily. 12/01/17  Yes Corey Skains, MD  ?budesonide-formoterol [facility]) 160-4.5 MCG/ACT inhaler Inhale 2 puffs into the lungs daily as needed (SHORTNESS OF BREATH).    Yes [provider]  ?ferrous sulfate 325 (65 FE) MG tablet Take 325 mg by mouth 2 (two) times daily with a meal.    Yes [provider]  ?gabapentin (NEURONTIN) 300 MG capsule Take 300 mg by mouth at bedtime.   Yes [provider]  ?isosorbide mononitrate (IMDUR) 30 MG 24 hr tablet  10/16/18  Yes [provider]  ?losartan (COZAAR) 25 MG tablet Take 1 tablet by mouth daily.   Yes [provider]  ?metoprolol succinate (TOPROL-XL) 25 MG 24 hr tablet Take 25 mg by mouth daily. 02/15/20  Yes [provider]  ?Multiple Vitamin (MULTIVITAMIN) tablet Take 1 tablet by mouth daily.   Yes [provider]  ?nitroGLYCERIN (NITROSTAT) 0.4 MG SL tablet Place 0.4 mg under the tongue every 5 (five) minutes as needed.    Yes [provider]  ?oxyCODONE (OXY IR/ROXICODONE) 5 MG immediate release tablet oxycodone 5 mg tablet ? TAKE ONE TABLET BY MOUTH EVERY 6 HOURS   Yes [provider]  ?oxyCODONE-acetaminophen (PERCOCET) 7.5-325 MG per tablet Take 1 tablet by mouth 2 (two) times daily as needed for severe pain.   Yes [provider]  ?pantoprazole (PROTONIX) 40 MG tablet Take by mouth. 11/01/18  Yes [provider]  ?tamsulosin (FLOMAX) 0.4 MG CAPS capsule  10/18/18  Yes [provider]   ?telmisartan (MICARDIS) 20 MG tablet TAKE 1 TABLET BY MOUTH EVERY DAY 12/06/18  Yes [provider]  ?tiZANidine (ZANAFLEX) 2 MG tablet Take 2 mg by mouth 3 (three) times daily. 07/25/21  Yes [provider]  ?torsemide (DEMADEX) 20 MG tablet TAKE 1 TABLET BY MOUTH EVERY DAY 11/11/18  Yes [provider]  ?traMADol (ULTRAM) 50 MG tablet Take 50 mg by mouth every 12 (twelve) hours as needed for moderate pain.   Yes [provider]  ?vitamin E 180 MG (400 UNITS) capsule Take by mouth.   Yes [provider]  ?carvedilol (COREG) 6.25 MG tablet Take by mouth. 04/04/20 04/04/21  [provider]  ?cyclobenzaprine (FLEXERIL) 5 MG tablet Take 5 mg by mouth as needed. ?Patient not taking: Reported on 04/18/2020 06/04/18   [provider]  ?DULoxetine (CYMBALTA) 30 MG capsule Take by mouth daily. ?Patient not taking: Reported on 04/18/2020 08/16/18   [provider]  ?Influenza Virus Vaccine Split SUSP Fluzone 2013-2014 45 mcg (15 mcg x 3)/0.5 mL intramuscular  suspension ? TO BE ADMINISTERED BY PHARMACIST FOR IMMUNIZATION    [provider]  ?potassium chloride (KLOR-CON) 10 MEQ tablet Take 10 mEq by mouth daily.     [provider]  ? ? ?Family History ?Family History  ?Problem Relation Age of Onset  ? Stroke Mother   ? Heart disease Father   ? Heart disease Paternal Grandfather   ? ? ?Social History ?Social History  ? ?Tobacco Use  ? Smoking status: Former  ?  Packs/day: 3.00  ?  Years: 48.00  ?  Pack years: 144.00  ?  Types: Cigarettes  ?  Quit date: 04/29/1999  ?  Years since quitting: 22.2  ? Smokeless tobacco: Never  ?Vaping Use  ? Vaping Use: Never used  ?Substance Use Topics  ? Alcohol use: Yes  ? Drug use: No  ? ? ? ?Allergies   ?Latex ? ? ?Review of Systems ?Review of Systems  ?Musculoskeletal:  Positive for arthralgias, gait problem and joint swelling.  ?Skin:  Positive for color change. Negative for wound.  ?Neurological:  Negative for  weakness and numbness.  ? ? ?Physical Exam ?Triage Vital Signs ?ED Triage Vitals  ?Enc Vitals Group  ?   BP   ?   Pulse   ?   Resp   ?   Temp   ?   Temp src   ?   SpO2   ?   Weight   ?   Height   ?   Head

## 2021-07-26 NOTE — ED Triage Notes (Signed)
Pt c/o slipping in the mud 07/20/21 and had swelling in his ankle and knee. Pt states that he has continued swelling and pain and would like a left ankle xray.  ?

## 2022-10-02 ENCOUNTER — Ambulatory Visit
Admission: EM | Admit: 2022-10-02 | Discharge: 2022-10-02 | Disposition: A | Discharge status: Home / Self Care | Payer: 59 | Attending: Emergency Medicine | Admitting: Emergency Medicine

## 2022-10-02 ENCOUNTER — Ambulatory Visit (INDEPENDENT_AMBULATORY_CARE_PROVIDER_SITE_OTHER): Payer: 59

## 2022-10-02 DIAGNOSIS — S2232XA Fracture of one rib, left side, initial encounter for closed fracture: Secondary | ICD-10-CM

## 2022-10-02 NOTE — ED Triage Notes (Signed)
Pt presents to UC c/o LT  rib pain x5 days. Pt has been taking percocet's he's prescribed fopr pain & aleve.

## 2022-10-02 NOTE — ED Provider Notes (Signed)
[facility]    CSN: [id number] Arrival date & time: 10/02/22  1415      History   Chief Complaint No chief complaint on file.   HPI Spencer Herring is a 67 y.o. male.   HPI  67 year old male with a history of COPD, asthma, anemia, obesity, sleep apnea, and neuromuscular disorder presents for evaluation of 5 days worth of pain in his left ribs just below the nipple line.  He states that the pain developed at the same time he developed GI symptoms which included vomiting and diarrhea.  He states that his diarrhea has resolved as has the vomiting.  He still has a decreased appetite and early satiety.  He denies any cough but he does state that he feels a little short of breath.  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) 12/15/2017   CAD (coronary artery disease) 12/01/2017   Angina decubitus 11/24/2017    Past Surgical History:  Procedure Laterality Date   BARIATRIC SURGERY N/A    COLONOSCOPY N/A 04/02/2015   Procedure: COLONOSCOPY;  Surgeon: Elnita Maxwell, MD;  Location: [facility] ENDOSCOPY;  Service: Endoscopy;  Laterality: N/A;   COLONOSCOPY WITH PROPOFOL N/A 12/01/2014   Procedure: COLONOSCOPY WITH PROPOFOL;  Surgeon: Elnita Maxwell, MD;  Location: [facility] ENDOSCOPY;  Service: Endoscopy;  Laterality: N/A;   COLONOSCOPY WITH PROPOFOL N/A 10/27/2018   Procedure: COLONOSCOPY WITH PROPOFOL;  Surgeon: Toledo, Boykin Nearing, MD;  Location: [facility] ENDOSCOPY;  Service:  Gastroenterology;  Laterality: N/A;   CORONARY STENT INTERVENTION N/A 12/01/2017   Procedure: CORONARY STENT INTERVENTION;  Surgeon: Marcina Millard, MD;  Location: [facility] INVASIVE CV LAB;  Service: Cardiovascular;  Laterality: N/A;   ESOPHAGOGASTRODUODENOSCOPY (EGD) WITH PROPOFOL N/A 10/27/2018   Procedure: ESOPHAGOGASTRODUODENOSCOPY (EGD) WITH PROPOFOL;  Surgeon: Toledo, Boykin Nearing, MD;  Location: [facility] ENDOSCOPY;  Service: Gastroenterology;  Laterality: N/A;   LEFT HEART CATH AND CORONARY ANGIOGRAPHY Left 12/01/2017   Procedure: LEFT HEART CATH AND CORONARY ANGIOGRAPHY;  Surgeon: Lamar Blinks, MD;  Location: [facility] INVASIVE CV LAB;  Service: Cardiovascular;  Laterality: Left;   LOWER EXTREMITY ANGIOGRAPHY Right 12/17/2017   Procedure: LOWER EXTREMITY ANGIOGRAPHY;  Surgeon: Annice Needy, MD;  Location: [facility] INVASIVE CV LAB;  Service: Cardiovascular;  Laterality: Right;   PNEUMONECTOMY Left        Home Medications    Prior to Admission medications   Medication Sig Start Date End Date Taking? Authorizing Provider  albuterol (ACCUNEB) 0.63 MG/3ML nebulizer solution Take 1 ampule by nebulization every 4 (four) hours as needed for wheezing.    [provider]  albuterol (VENTOLIN HFA) 108 (90 Base) MCG/ACT inhaler Ventolin HFA 90 mcg/actuation aerosol inhaler  INHALE 2 INHALATIONS INTO THE LUNGS EVERY 4 (FOUR) HOURS AS NEEDED FOR WHEEZING    [provider]  ALPRAZolam (XANAX) 0.25 MG tablet Take 0.25 mg by mouth at bedtime as needed for anxiety.    [provider]  amLODipine (NORVASC) 2.5 MG  tablet Take 1 tablet (2.5 mg total) by mouth daily. 12/01/17   Lamar Blinks, MD  ascorbic acid (VITAMIN C) 250 MG tablet Take 250 mg by mouth daily.    [provider]  aspirin 81 MG chewable tablet Chew 1 tablet (81 mg total) by mouth daily. 12/02/17   Lamar Blinks, MD  atorvastatin (LIPITOR) 40 MG tablet Take 1 tablet (40 mg total) by mouth daily. 12/01/17    Lamar Blinks, MD  budesonide-formoterol (SYMBICORT) 160-4.5 MCG/ACT inhaler Inhale 2 puffs into the lungs daily as needed (SHORTNESS OF BREATH).     [provider]  carvedilol (COREG) 6.25 MG tablet Take by mouth. 04/04/20 04/04/21  [provider]  cyclobenzaprine (FLEXERIL) 5 MG tablet Take 5 mg by mouth as needed. Patient not taking: Reported on 04/18/2020 06/04/18   [provider]  DULoxetine (CYMBALTA) 30 MG capsule Take by mouth daily. Patient not taking: Reported on 04/18/2020 08/16/18   [provider]  ferrous sulfate 325 (65 FE) MG tablet Take 325 mg by mouth 2 (two) times daily with a meal.     [provider]  gabapentin (NEURONTIN) 300 MG capsule Take 300 mg by mouth at bedtime.    [provider]  isosorbide mononitrate (IMDUR) 30 MG 24 hr tablet  10/16/18   [provider]  losartan (COZAAR) 25 MG tablet Take 1 tablet by mouth daily.    [provider]  metoprolol succinate (TOPROL-XL) 25 MG 24 hr tablet Take 25 mg by mouth daily. 02/15/20   [provider]  Multiple Vitamin (MULTIVITAMIN) tablet Take 1 tablet by mouth daily.    [provider]  nitroGLYCERIN (NITROSTAT) 0.4 MG SL tablet Place 0.4 mg under the tongue every 5 (five) minutes as needed.     [provider]  oxyCODONE (OXY IR/ROXICODONE) 5 MG immediate release tablet oxycodone 5 mg tablet  TAKE ONE TABLET BY MOUTH EVERY 6 HOURS    [provider]  oxyCODONE-acetaminophen (PERCOCET) 7.5-325 MG per tablet Take 1 tablet by mouth 2 (two) times daily as needed for severe pain.    [provider]  pantoprazole (PROTONIX) 40 MG tablet Take by mouth. 11/01/18   [provider]  potassium chloride (KLOR-CON) 10 MEQ tablet Take 10 mEq by mouth daily.     [provider]  tamsulosin (FLOMAX) 0.4 MG CAPS capsule  10/18/18   [provider]  telmisartan (MICARDIS) 20 MG tablet TAKE 1 TABLET  BY MOUTH EVERY DAY 12/06/18   [provider]  tiZANidine (ZANAFLEX) 2 MG tablet Take 2 mg by mouth 3 (three) times daily. 07/25/21   [provider]  torsemide (DEMADEX) 20 MG tablet TAKE 1 TABLET BY MOUTH EVERY DAY 11/11/18   [provider]  traMADol (ULTRAM) 50 MG tablet Take 50 mg by mouth every 12 (twelve) hours as needed for moderate pain.    [provider]  vitamin E 180 MG (400 UNITS) capsule Take by mouth.    [provider]    Family History Family History  Problem Relation Age of Onset   Stroke Mother    Heart disease Father    Heart disease Paternal Grandfather     Social History Social History   Tobacco Use   Smoking status: Former    Packs/day: 3.00    Years: 48.00    Additional pack years: 0.00    Total pack years: 144.00    Types: Cigarettes    Quit  date: 04/29/1999    Years since quitting: 23.4   Smokeless tobacco: Never  Vaping Use   Vaping Use: Never used  Substance Use Topics   Alcohol use: Yes   Drug use: No     Allergies   Latex   Review of Systems Review of Systems  Constitutional:  Negative for unexpected weight change.  Respiratory:  Positive for shortness of breath. Negative for cough.   Cardiovascular:  Positive for chest pain.       Left rib pain     Physical Exam Triage Vital Signs ED Triage Vitals [10/02/22 1444]  Enc Vitals Group     BP (!) 153/91     Pulse Rate 60     Resp      Temp 98.5 F (36.9 C)     Temp Source Oral     SpO2 96 %     Weight      Height      Head Circumference      Peak Flow      Pain Score 7     Pain Loc      Pain Edu?      Excl. in GC?    No data found.  Updated Vital Signs BP (!) 153/91 (BP Location: Left Arm)   Pulse 60   Temp 98.5 F (36.9 C) (Oral)   SpO2 96%   Visual Acuity Right Eye Distance:   Left Eye Distance:   Bilateral Distance:    Right Eye Near:   Left Eye Near:    Bilateral Near:     Physical Exam Vitals and nursing note  reviewed.  Constitutional:      Appearance: Normal appearance.  HENT:     Head: Normocephalic and atraumatic.  Cardiovascular:     Rate and Rhythm: Normal rate and regular rhythm.     Pulses: Normal pulses.     Heart sounds: Normal heart sounds. No murmur heard.    No friction rub. No gallop.  Pulmonary:     Effort: Pulmonary effort is normal.     Breath sounds: Normal breath sounds. No wheezing, rhonchi or rales.  Chest:     Chest wall: Tenderness present.  Skin:    General: Skin is warm and dry.     Capillary Refill: Capillary refill takes less than 2 seconds.     Findings: No bruising or erythema.  Neurological:     General: No focal deficit present.     Mental Status: He is alert and oriented to person, place, and time.      UC Treatments / Results  Labs (all labs ordered are listed, but only abnormal results are displayed) Labs Reviewed - No data to display  EKG   Radiology DG Ribs Unilateral W/Chest Left  Result Date: 10/02/2022 CLINICAL DATA:  Left rib pain for 5 days without known injury. EXAM: LEFT RIBS AND CHEST - 3+ VIEW COMPARISON:  May 04, 2014. FINDINGS: Minimally displaced left eighth rib fracture is noted. Minimal left pleural effusion is noted with minimal left basilar atelectasis. Left apical scarring is noted. IMPRESSION: Minimally displaced left eighth rib fracture. Electronically Signed   By: Lupita Raider M.D.   On: 10/02/2022 15:38    Procedures Procedures (including critical care time)  Medications Ordered in UC Medications - No data to display  Initial Impression / Assessment and Plan / UC Course  I have reviewed the triage vital signs and the nursing notes.  Pertinent labs & imaging results  that were available during my care of the patient were reviewed by me and considered in my medical decision making (see chart for details).   Patient is a pleasant, nontoxic-appearing 67 year old male presenting for evaluation of 5 days worth of left  rib pain as outlined HPI above.  In exam room he is not in any acute distress and he can speak in full sentences without dyspnea or tachypnea.  His room air oxygen saturation is 96%.  Lungs are clear to auscultation in all fields and patient has normal chest excursion with respirations.  The pain is mildly reproducible with palpation of the anterior ribs just below the nipple line.  There is no overlying edema, ecchymosis, or erythema.  I will obtain a left rib series to rule out any bony abnormality.  Left rib x-rays independently reviewed and evaluated by me.  Impression: No evidence of fracture noted.  Lung fields are clear.  Radiology read is pending. Radiology impression states that there is a minimally displaced left eighth rib fracture along with minimal left pleural effusion.  I will discharge patient home with a diagnosis of left rib fracture and have him use over-the-counter Tylenol and/or ibuprofen as needed for pain.  If his symptoms not improve he can return for reevaluation or follow-up with his PCP.   Final Clinical Impressions(s) / UC Diagnoses   Final diagnoses:  Closed fracture of one rib of left side, initial encounter     Discharge Instructions      Your x-rays show that you have a minimally displaced left rib fracture at your eighth rib.  This is most likely the cause of your pain.  Use over-the-counter Tylenol and or ibuprofen according to package instructions as needed for pain.  If you have to cough or sneeze Braisher rib with your arm, hand, or a pillow to help give it support and minimize discomfort.  If you have any increased pain or shortness of breath you need to return for reevaluation or seek care in the ER.     ED Prescriptions   None    I have reviewed the PDMP during this encounter.   Becky Augusta, NP 10/02/22 [phone]

## 2022-10-02 NOTE — Discharge Instructions (Addendum)
Your x-rays show that you have a minimally displaced left rib fracture at your eighth rib.  This is most likely the cause of your pain.  Use over-the-counter Tylenol and or ibuprofen according to package instructions as needed for pain.  If you have to cough or sneeze Braisher rib with your arm, hand, or a pillow to help give it support and minimize discomfort.  If you have any increased pain or shortness of breath you need to return for reevaluation or seek care in the ER.

## 2023-01-01 ENCOUNTER — Inpatient Hospital Stay
Admission: EM | Admit: 2023-01-01 | Discharge: 2023-01-06 | DRG: 321 | Disposition: A | Discharge status: Home / Self Care | Payer: Medicare HMO | Attending: Internal Medicine | Admitting: Internal Medicine

## 2023-01-01 ENCOUNTER — Encounter: Payer: Self-pay | Admitting: Urgent Care

## 2023-01-01 ENCOUNTER — Other Ambulatory Visit: Payer: Self-pay

## 2023-01-01 ENCOUNTER — Emergency Department: Payer: Medicare HMO

## 2023-01-01 ENCOUNTER — Encounter: Payer: Self-pay | Admitting: Emergency Medicine

## 2023-01-01 ENCOUNTER — Inpatient Hospital Stay: Payer: Medicare HMO

## 2023-01-01 DIAGNOSIS — Z91040 Latex allergy status: Secondary | ICD-10-CM | POA: Diagnosis not present

## 2023-01-01 DIAGNOSIS — I2510 Atherosclerotic heart disease of native coronary artery without angina pectoris: Secondary | ICD-10-CM | POA: Diagnosis present

## 2023-01-01 DIAGNOSIS — Z955 Presence of coronary angioplasty implant and graft: Secondary | ICD-10-CM | POA: Diagnosis not present

## 2023-01-01 DIAGNOSIS — Z8249 Family history of ischemic heart disease and other diseases of the circulatory system: Secondary | ICD-10-CM

## 2023-01-01 DIAGNOSIS — Z87891 Personal history of nicotine dependence: Secondary | ICD-10-CM

## 2023-01-01 DIAGNOSIS — Z1152 Encounter for screening for COVID-19: Secondary | ICD-10-CM | POA: Diagnosis not present

## 2023-01-01 DIAGNOSIS — Z7951 Long term (current) use of inhaled steroids: Secondary | ICD-10-CM

## 2023-01-01 DIAGNOSIS — R0609 Other forms of dyspnea: Secondary | ICD-10-CM | POA: Diagnosis not present

## 2023-01-01 DIAGNOSIS — I4891 Unspecified atrial fibrillation: Secondary | ICD-10-CM | POA: Diagnosis present

## 2023-01-01 DIAGNOSIS — E785 Hyperlipidemia, unspecified: Secondary | ICD-10-CM | POA: Diagnosis present

## 2023-01-01 DIAGNOSIS — I420 Dilated cardiomyopathy: Secondary | ICD-10-CM | POA: Diagnosis present

## 2023-01-01 DIAGNOSIS — I110 Hypertensive heart disease with heart failure: Principal | ICD-10-CM | POA: Diagnosis present

## 2023-01-01 DIAGNOSIS — I25110 Atherosclerotic heart disease of native coronary artery with unstable angina pectoris: Secondary | ICD-10-CM | POA: Diagnosis present

## 2023-01-01 DIAGNOSIS — Z7901 Long term (current) use of anticoagulants: Secondary | ICD-10-CM

## 2023-01-01 DIAGNOSIS — Z823 Family history of stroke: Secondary | ICD-10-CM | POA: Diagnosis not present

## 2023-01-01 DIAGNOSIS — M7989 Other specified soft tissue disorders: Secondary | ICD-10-CM | POA: Diagnosis present

## 2023-01-01 DIAGNOSIS — F419 Anxiety disorder, unspecified: Secondary | ICD-10-CM | POA: Diagnosis present

## 2023-01-01 DIAGNOSIS — Z9884 Bariatric surgery status: Secondary | ICD-10-CM | POA: Diagnosis not present

## 2023-01-01 DIAGNOSIS — I5023 Acute on chronic systolic (congestive) heart failure: Secondary | ICD-10-CM | POA: Diagnosis present

## 2023-01-01 DIAGNOSIS — G4730 Sleep apnea, unspecified: Secondary | ICD-10-CM | POA: Diagnosis present

## 2023-01-01 DIAGNOSIS — Z6838 Body mass index (BMI) 38.0-38.9, adult: Secondary | ICD-10-CM | POA: Diagnosis not present

## 2023-01-01 DIAGNOSIS — Z79899 Other long term (current) drug therapy: Secondary | ICD-10-CM

## 2023-01-01 DIAGNOSIS — J4489 Other specified chronic obstructive pulmonary disease: Secondary | ICD-10-CM | POA: Diagnosis present

## 2023-01-01 DIAGNOSIS — I5021 Acute systolic (congestive) heart failure: Secondary | ICD-10-CM | POA: Diagnosis present

## 2023-01-01 DIAGNOSIS — D508 Other iron deficiency anemias: Secondary | ICD-10-CM | POA: Diagnosis present

## 2023-01-01 DIAGNOSIS — I509 Heart failure, unspecified: Secondary | ICD-10-CM | POA: Diagnosis not present

## 2023-01-01 DIAGNOSIS — Z7982 Long term (current) use of aspirin: Secondary | ICD-10-CM | POA: Diagnosis not present

## 2023-01-01 DIAGNOSIS — D509 Iron deficiency anemia, unspecified: Secondary | ICD-10-CM | POA: Diagnosis present

## 2023-01-01 DIAGNOSIS — K9589 Other complications of other bariatric procedure: Secondary | ICD-10-CM | POA: Diagnosis present

## 2023-01-01 DIAGNOSIS — R600 Localized edema: Secondary | ICD-10-CM | POA: Diagnosis present

## 2023-01-01 DIAGNOSIS — R000 Tachycardia, unspecified: Secondary | ICD-10-CM | POA: Diagnosis present

## 2023-01-01 LAB — RESP PANEL BY RT-PCR (RSV, FLU A&B, COVID)  RVPGX2
Influenza A by PCR: NEGATIVE
Influenza B by PCR: NEGATIVE
Resp Syncytial Virus by PCR: NEGATIVE
SARS Coronavirus 2 by RT PCR: NEGATIVE

## 2023-01-01 LAB — CBC
HCT: 38.4 % — ABNORMAL LOW (ref 39.0–52.0)
Hemoglobin: 12.3 g/dL — ABNORMAL LOW (ref 13.0–17.0)
MCH: 29.3 pg (ref 26.0–34.0)
MCHC: 32 g/dL (ref 30.0–36.0)
MCV: 91.4 fL (ref 80.0–100.0)
Platelets: 264 10*3/uL (ref 150–400)
RBC: 4.2 MIL/uL — ABNORMAL LOW (ref 4.22–5.81)
RDW: 13.9 % (ref 11.5–15.5)
WBC: 7.7 10*3/uL (ref 4.0–10.5)
nRBC: 0 % (ref 0.0–0.2)

## 2023-01-01 LAB — BASIC METABOLIC PANEL
Anion gap: 11 (ref 5–15)
BUN: 22 mg/dL (ref 8–23)
CO2: 27 mmol/L (ref 22–32)
Calcium: 9.2 mg/dL (ref 8.9–10.3)
Chloride: 100 mmol/L (ref 98–111)
Creatinine, Ser: 0.94 mg/dL (ref 0.61–1.24)
GFR, Estimated: >60 mL/min (ref >60)
Glucose, Bld: 98 mg/dL (ref 70–99)
Potassium: 4.3 mmol/L (ref 3.5–5.1)
Sodium: 138 mmol/L (ref 135–145)

## 2023-01-01 LAB — TROPONIN I (HIGH SENSITIVITY)
Troponin I (High Sensitivity): 9 ng/L (ref <18)
Troponin I (High Sensitivity): 10 ng/L (ref <18)

## 2023-01-01 LAB — BRAIN NATRIURETIC PEPTIDE: B Natriuretic Peptide: 226.2 pg/mL — ABNORMAL HIGH (ref 0.0–100.0)

## 2023-01-01 MED ORDER — ENOXAPARIN SODIUM 40 MG/0.4ML IJ SOSY
40.0000 mg | PREFILLED_SYRINGE | INTRAMUSCULAR | Status: DC
  Administered 2023-01-01: 40 mg via SUBCUTANEOUS
  Filled 2023-01-01: qty 0.4

## 2023-01-01 MED ORDER — ATORVASTATIN CALCIUM 20 MG PO TABS
40.0000 mg | ORAL_TABLET | Freq: Every day | ORAL | Status: DC
  Administered 2023-01-01 – 2023-01-04 (×4): 40 mg via ORAL
  Filled 2023-01-01 (×4): qty 2

## 2023-01-01 MED ORDER — RANOLAZINE ER 500 MG PO TB12
500.0000 mg | ORAL_TABLET | Freq: Two times a day (BID) | ORAL | Status: DC
  Administered 2023-01-01 – 2023-01-06 (×9): 500 mg via ORAL
  Filled 2023-01-01 (×9): qty 1

## 2023-01-01 MED ORDER — ADULT MULTIVITAMIN W/MINERALS CH
1.0000 | ORAL_TABLET | Freq: Every day | ORAL | Status: DC
  Administered 2023-01-02 – 2023-01-06 (×4): 1 via ORAL
  Filled 2023-01-01 (×4): qty 1

## 2023-01-01 MED ORDER — FERROUS SULFATE 325 (65 FE) MG PO TABS
325.0000 mg | ORAL_TABLET | Freq: Two times a day (BID) | ORAL | Status: DC
  Administered 2023-01-02 – 2023-01-06 (×8): 325 mg via ORAL
  Filled 2023-01-01 (×8): qty 1

## 2023-01-01 MED ORDER — NITROGLYCERIN 0.4 MG SL SUBL
0.4000 mg | SUBLINGUAL_TABLET | SUBLINGUAL | Status: DC | PRN
  Administered 2023-01-05 (×2): 0.4 mg via SUBLINGUAL
  Filled 2023-01-01: qty 1

## 2023-01-01 MED ORDER — OXYCODONE-ACETAMINOPHEN 7.5-325 MG PO TABS
1.0000 | ORAL_TABLET | Freq: Two times a day (BID) | ORAL | Status: DC | PRN
  Administered 2023-01-04 – 2023-01-05 (×2): 1 via ORAL
  Filled 2023-01-01 (×2): qty 1

## 2023-01-01 MED ORDER — FUROSEMIDE 10 MG/ML IJ SOLN
20.0000 mg | Freq: Every day | INTRAMUSCULAR | Status: AC
  Administered 2023-01-01: 20 mg via INTRAVENOUS
  Filled 2023-01-01: qty 4

## 2023-01-01 MED ORDER — ACETAMINOPHEN 325 MG PO TABS
650.0000 mg | ORAL_TABLET | Freq: Four times a day (QID) | ORAL | Status: DC | PRN
  Administered 2023-01-01 – 2023-01-04 (×4): 650 mg via ORAL
  Filled 2023-01-01 (×6): qty 2

## 2023-01-01 MED ORDER — TRAMADOL HCL 50 MG PO TABS: 50.0000 mg | ORAL_TABLET | Freq: Two times a day (BID) | ORAL | Status: DC | PRN

## 2023-01-01 MED ORDER — SENNOSIDES-DOCUSATE SODIUM 8.6-50 MG PO TABS
1.0000 | ORAL_TABLET | Freq: Every evening | ORAL | Status: DC | PRN
  Administered 2023-01-03 – 2023-01-04 (×2): 1 via ORAL
  Filled 2023-01-01 (×2): qty 1

## 2023-01-01 MED ORDER — VITAMIN C 500 MG PO TABS
250.0000 mg | ORAL_TABLET | Freq: Every day | ORAL | Status: DC
  Administered 2023-01-01 – 2023-01-06 (×5): 250 mg via ORAL
  Filled 2023-01-01 (×5): qty 1

## 2023-01-01 MED ORDER — TIZANIDINE HCL 2 MG PO TABS: 2.0000 mg | ORAL_TABLET | Freq: Four times a day (QID) | ORAL | Status: DC | PRN

## 2023-01-01 MED ORDER — ALBUTEROL SULFATE (2.5 MG/3ML) 0.083% IN NEBU
3.0000 mL | INHALATION_SOLUTION | RESPIRATORY_TRACT | Status: DC | PRN
  Administered 2023-01-02 – 2023-01-05 (×2): 3 mL via RESPIRATORY_TRACT
  Filled 2023-01-01 (×2): qty 3

## 2023-01-01 MED ORDER — LACTATED RINGERS IV BOLUS
1000.0000 mL | Freq: Once | INTRAVENOUS | Status: AC
  Administered 2023-01-01: 1000 mL via INTRAVENOUS

## 2023-01-01 MED ORDER — METOPROLOL TARTRATE 5 MG/5ML IV SOLN
5.0000 mg | Freq: Once | INTRAVENOUS | Status: AC
Start: 2023-01-01 — End: 2023-01-01
  Administered 2023-01-01: 5 mg via INTRAVENOUS
  Filled 2023-01-01: qty 5

## 2023-01-01 MED ORDER — ONDANSETRON HCL 4 MG PO TABS: 4.0000 mg | ORAL_TABLET | Freq: Four times a day (QID) | ORAL | Status: DC | PRN

## 2023-01-01 MED ORDER — DILTIAZEM HCL-DEXTROSE 125-5 MG/125ML-% IV SOLN (PREMIX)
5.0000 mg/h | INTRAVENOUS | Status: DC
  Administered 2023-01-01: 5 mg/h via INTRAVENOUS
  Filled 2023-01-01: qty 125

## 2023-01-01 MED ORDER — LOSARTAN POTASSIUM 25 MG PO TABS
25.0000 mg | ORAL_TABLET | Freq: Every day | ORAL | Status: DC
  Administered 2023-01-02 – 2023-01-06 (×4): 25 mg via ORAL
  Filled 2023-01-01 (×4): qty 1

## 2023-01-01 MED ORDER — ASPIRIN 81 MG PO CHEW
81.0000 mg | CHEWABLE_TABLET | Freq: Every day | ORAL | Status: DC
  Administered 2023-01-01: 81 mg via ORAL
  Filled 2023-01-01 (×2): qty 1

## 2023-01-01 MED ORDER — ONDANSETRON HCL 4 MG/2ML IJ SOLN: 4.0000 mg | Freq: Four times a day (QID) | INTRAMUSCULAR | Status: DC | PRN

## 2023-01-01 MED ORDER — ALPRAZOLAM 0.25 MG PO TABS
0.2500 mg | ORAL_TABLET | Freq: Every evening | ORAL | Status: DC | PRN
  Administered 2023-01-03 – 2023-01-05 (×3): 0.25 mg via ORAL
  Filled 2023-01-01 (×3): qty 1

## 2023-01-01 MED ORDER — GABAPENTIN 300 MG PO CAPS
300.0000 mg | ORAL_CAPSULE | Freq: Every day | ORAL | Status: DC
  Administered 2023-01-01 – 2023-01-05 (×5): 300 mg via ORAL
  Filled 2023-01-01 (×5): qty 1

## 2023-01-01 MED ORDER — ACETAMINOPHEN 650 MG RE SUPP: 650.0000 mg | Freq: Four times a day (QID) | RECTAL | Status: DC | PRN

## 2023-01-01 NOTE — Assessment & Plan Note (Signed)
Secondary to heart failure exacerbation

## 2023-01-01 NOTE — Assessment & Plan Note (Signed)
Home iron supplementation resumed on admission

## 2023-01-01 NOTE — Assessment & Plan Note (Addendum)
Aspirin 81 mg daily, atorvastatin 40 mg nightly, losartan 25 mg daily, resumed on admission Ranexa 500 mg p.o. twice daily resumed

## 2023-01-01 NOTE — ED Provider Notes (Signed)
[facility] Provider Note    Event Date/Time   First MD Initiated Contact with Patient 01/01/23 1255     (approximate)   History   Shortness of Breath and Chest Pain   HPI Spencer Herring is a 67 y.o. male with COPD, CAD s/p PCI x 2, reported prior left-sided lobectomy, HTN, HLD presenting today for shortness of breath.  Patient notes over the past week he has had worsening shortness of breath, chills, and increased leg swelling.  Today started noticing chest pressure in the middle which is constant.  He went to the fire station was found to have an elevated heart rate that looked like A-fib which she denies any prior history of.  Otherwise denies nausea, abdominal pain, diarrhea, constipation.  He is on torsemide but denies ever being told having heart failure.  He does note decreased urinary output recently.     Physical Exam   Triage Vital Signs: ED Triage Vitals  Encounter Vitals Group     BP 01/01/23 1224 (!) 150/108     Systolic BP Percentile --      Diastolic BP Percentile --      Pulse Rate 01/01/23 1224 (!) 140     Resp 01/01/23 1224 20     Temp 01/01/23 1224 97.8 F (36.6 C)     Temp Source 01/01/23 1224 Oral     SpO2 01/01/23 1224 94 %     Weight 01/01/23 1225 223 lb (101.2 kg)     Height 01/01/23 1225 5\' 4"  (1.626 m)     Head Circumference --      Peak Flow --      Pain Score 01/01/23 1225 5     Pain Loc --      Pain Education --      Exclude from Growth Chart --     Most recent vital signs: Vitals:   01/01/23 1400 01/01/23 1430  BP: 124/82 113/82  Pulse: 91 97  Resp: 15 14  Temp:    SpO2: 99% 96%   Physical Exam: I have reviewed the vital signs and nursing notes. General: Awake, alert, no acute distress.  Nontoxic appearing. Head:  Atraumatic, normocephalic.   ENT:  EOM intact, PERRL. Oral mucosa is pink and moist with no lesions. Neck: Neck is supple with full range of motion, No meningeal signs. Cardiovascular:  Tachycardic with a regular rhythm, no murmurs. Peripheral pulses palpable and equal bilaterally. Respiratory:  Symmetrical chest wall expansion.  No rhonchi, rales, or wheezes.  Good air movement throughout.  No use of accessory muscles.   Musculoskeletal:  No cyanosis.  2+ pitting edema to bilateral lower extremities. Moving extremities with full ROM Abdomen:  Soft, nontender, nondistended. Neuro:  GCS 15, moving all four extremities, interacting appropriately. Speech clear. Psych:  Calm, appropriate.   Skin:  Warm, dry, no rash.    ED Results / Procedures / Treatments   Labs (all labs ordered are listed, but only abnormal results are displayed) Labs Reviewed  CBC - Abnormal; Notable for the following components:      Result Value   RBC 4.20 (*)    Hemoglobin 12.3 (*)    HCT 38.4 (*)    All other components within normal limits  BRAIN NATRIURETIC PEPTIDE - Abnormal; Notable for the following components:   B Natriuretic Peptide 226.2 (*)    All other components within normal limits  RESP PANEL BY RT-PCR (RSV, FLU A&B, COVID)  RVPGX2  BASIC  METABOLIC PANEL  TROPONIN I (HIGH SENSITIVITY)  TROPONIN I (HIGH SENSITIVITY)     EKG My EKG interpretation: Rate of 136, A-fib with RVR, normal axis.  No acute ST elevations or depressions   RADIOLOGY Independently interpreted chest x-ray with some concern for pulmonary edema no other focal consolidations   PROCEDURES:  Critical Care performed: Yes, see critical care procedure note(s)  .Critical Care  Performed by: Janith Lima, MD Authorized by: Janith Lima, MD   Critical care provider statement:    Critical care time (minutes):  35   Critical care was necessary to treat or prevent imminent or life-threatening deterioration of the following conditions:  Circulatory failure (a fib w/ RVR)   Critical care was time spent personally by me on the following activities:  Development of treatment plan with patient or surrogate,  discussions with consultants, evaluation of patient's response to treatment, examination of patient, ordering and review of laboratory studies, ordering and review of radiographic studies, ordering and performing treatments and interventions, pulse oximetry, re-evaluation of patient's condition and review of old charts    MEDICATIONS ORDERED IN ED: Medications  diltiazem (CARDIZEM) 125 mg in dextrose 5% 125 mL (1 mg/mL) infusion (5 mg/hr Intravenous New Bag/Given 01/01/23 1427)  lactated ringers bolus 1,000 mL (0 mLs Intravenous Stopped 01/01/23 1425)  metoprolol tartrate (LOPRESSOR) injection 5 mg (5 mg Intravenous Given 01/01/23 1322)     IMPRESSION / MDM / ASSESSMENT AND PLAN / ED COURSE  I reviewed the triage vital signs and the nursing notes.                              Differential diagnosis includes, but is not limited to, A-fib with RVR, ACS, pneumonia, CHF exacerbation, viral upper respiratory infection.  Patient's presentation is most consistent with acute presentation with potential threat to life or bodily function.  Patient is a 68 year old male presenting today for shortness of breath and chest pain with EKG showing A-fib with RVR with patient having no prior history of this.  No hypoxia on room air.  Exam shows 2+ pitting edema to bilateral lower extremities which patient notes is worse than normal and he is currently on torsemide at home.  Denies prior history of CHF.  Initial troponin of 9 and CBC and BMP unremarkable.  BNP is slightly elevated at 226.  Patient negative for COVID, flu, and RSV.  Patient initially given 5 mg IV metoprolol push.  This did not result in immediate change in his heart rate but did drop his blood pressure down to 92/74.  Given the low blood pressure, did not want to give another bolus push of the medicine.  Patient was then started on a diltiazem drip which did improve rate into the upper 90s.  Given new A-fib with RVR along with acute CHF exacerbation  likely needing diuresis, patient will be admitted to hospitalist service for further care.  The patient is on the cardiac monitor to evaluate for evidence of arrhythmia and/or significant heart rate changes. Clinical Course as of 01/01/23 1450  Thu Jan 01, 2023  1319 Troponin I (High Sensitivity): 9 [DW]  1319 CBC(!) Unremarkable [DW]  1319 Basic metabolic panel Unremarkable [DW]  1339 B Natriuretic Peptide(!): 226.2 [DW]  1421 Resp panel by RT-PCR (RSV, Flu A&B, Covid) Anterior Nasal Swab All negative [DW]    Clinical Course User Index [DW] Janith Lima, MD     FINAL CLINICAL  IMPRESSION(S) / ED DIAGNOSES   Final diagnoses:  Atrial fibrillation with RVR (HCC)  Acute congestive heart failure, unspecified heart failure type (HCC)     Rx / DC Orders   ED Discharge Orders     None        Note:  This document was prepared using Dragon voice recognition software and may include unintentional dictation errors.   Janith Lima, MD 01/01/23 [phone]

## 2023-01-01 NOTE — ED Triage Notes (Signed)
Pt says he stopped by fire station and was told he had Afib. Endorses SOB, fatigue and chest pains for a week. Bilateral leg swelling. No hx of afib.

## 2023-01-01 NOTE — Assessment & Plan Note (Addendum)
Strict I's and O's Furosemide 20 mg IV one-time dose ordered Complete echo ordered Cardiology consulted for consideration of cardiac stress test to Dr. Melton Alar via staff message

## 2023-01-01 NOTE — Hospital Course (Signed)
Spencer Herring is a 67 year old male with history of CAD status post PCI, hypertension, hyperlipidemia, anxiety, angina, who presents to the emergency department for chief concerns of shortness of breath and fatigue with bilateral leg swelling.  Patient was found to have atrial fibrillation with RVR in the ED.  Vitals in the ED showed temperature of 97.8, respiration rate of 20, heart rate of 140, blood pressure 150/108, SpO2 of 94% on room air.  Serum sodium is 138, potassium 4.3, chloride 100, bicarb 27, BUN of 22, serum creatinine of 0.94, EGFR greater than 60, nonfasting blood glucose 98, WBC 7.7, hemoglobin 12.3, platelets of 264.  BNP was 226.2,  high sensitive troponin was 9 and on repeat was 10.  ED treatment: Diltiazem gtt., metoprolol 5 mg IV, LR 1 L bolus.  Of note per ED provider, patient was given metoprolol 5 mg IV, which minimally improved his heart rate and cause mild transient hypotension with SBP in the 90s.

## 2023-01-01 NOTE — Assessment & Plan Note (Signed)
This meets criteria for morbid obesity based on the presence of 1 or more chronic comorbidities. Patient has BMI of 38.28 and CAD. This complicates overall care and prognosis.

## 2023-01-01 NOTE — H&P (Signed)
History and Physical   Spencer Herring ZOX:[id number] DOB: 1955/08/21 DOA: 01/01/2023  PCP: Barbette Reichmann, MD  Outpatient Specialists: Dr. Erskine Emery, [facility] Patient coming from: home  I have personally briefly reviewed patient's old medical records in [facility] EMR.  Chief Concern: shortness of breath, bilateral lower extremity swelling  HPI: Spencer Herring is a 67 year old male with history of CAD status post PCI, hypertension, hyperlipidemia, anxiety, angina, who presents to the emergency department for chief concerns of shortness of breath and fatigue with bilateral leg swelling.  Patient was found to have atrial fibrillation with RVR in the ED.  Vitals in the ED showed temperature of 97.8, respiration rate of 20, heart rate of 140, blood pressure 150/108, SpO2 of 94% on room air.  Serum sodium is 138, potassium 4.3, chloride 100, bicarb 27, BUN of 22, serum creatinine of 0.94, EGFR greater than 60, nonfasting blood glucose 98, WBC 7.7, hemoglobin 12.3, platelets of 264.  BNP was 226.2,  high sensitive troponin was 9 and on repeat was 10.  ED treatment: Diltiazem gtt., metoprolol 5 mg IV, LR 1 L bolus.  Of note per ED provider, patient was given metoprolol 5 mg IV, which minimally improved his heart rate and cause mild transient hypotension with SBP in the 90s. ------------------------------ At bedside, he is able to tell me his name, age, location, current calendar years.   He reports shortness of breath and chest pain worse with exertion. He denies current chest pain and shortness of breath currently at bedside. He endorses fatigue. These symptoms improve with rest.   He denies trauma to his person, including chest area. He endorses compliance with home medications.   He endorses associated bilateral lower extremity edema.   He endorses feelings of fever, with tmax of 100 at home, chills, and nausea. He did not take anything at home for these symptoms. He  denies vomiting.   Social history: he lives on his own. He denies tobacco use. He endorses daily beer drinking (4-5 bottles of 12 oz bottles/cans). He denies history of alcohol withdrawal and alcohol related seizures. He denies recreational drug use.   ROS: Constitutional: no weight change, + fever ENT/Mouth: no sore throat, no rhinorrhea Eyes: no eye pain, no vision changes Cardiovascular: + chest pain, + dyspnea,  no edema, no palpitations Respiratory: no cough, no sputum, no wheezing Gastrointestinal: + nausea, no vomiting, no diarrhea, no constipation Genitourinary: no urinary incontinence, no dysuria, no hematuria Musculoskeletal: no arthralgias, no myalgias Skin: no skin lesions, no pruritus, Neuro: + weakness, no loss of consciousness, no syncope Psych: no anxiety, no depression, no decrease appetite Heme/Lymph: no bruising, no bleeding  ED Course: Discussed with medicine provider, patient requiring hospitalization for chief concerns of heart failure exacerbation and atrial fibrillation with RVR.  Assessment/Plan  Principal Problem:   Acute exacerbation of CHF (congestive heart failure) (HCC) Active Problems:   CAD (coronary artery disease)   Morbid obesity (HCC)   Bilateral lower extremity edema   Iron deficiency anemia following bariatric surgery   Hyperlipidemia   Assessment and Plan:  * Acute exacerbation of CHF (congestive heart failure) (HCC) Strict I's and O's Furosemide 20 mg IV one-time dose ordered Complete echo ordered Cardiology consulted for consideration of cardiac stress test to Dr. Melton Alar via staff message  Hyperlipidemia Atorvastatin 40 mg nightly presumed  Iron deficiency anemia following bariatric surgery Home iron supplementation resumed on admission  Bilateral lower extremity edema Secondary to heart failure exacerbation  Morbid obesity (HCC)  This meets criteria for morbid obesity based on the presence of 1 or more chronic comorbidities.  Patient has BMI of 38.28 and CAD. This complicates overall care and prognosis.   CAD (coronary artery disease) Aspirin 81 mg daily, atorvastatin 40 mg nightly, losartan 25 mg daily, resumed on admission Ranexa 500 mg p.o. twice daily resumed  Chart reviewed.   DVT prophylaxis: Enoxaparin Code Status: full code  Diet: Heart healthy Family Communication: no  Disposition Plan: Pending clinical course Consults called: cardiology via staff message sent to Dr. Melton Alar Admission status: Telemetry cardiac, inpatient  Past Medical History:  Diagnosis Date   Anemia    Asthma    COPD (chronic obstructive pulmonary disease) (HCC)    Morbid obesity (HCC)    Neuromuscular disorder (HCC)    Sleep apnea    Past Surgical History:  Procedure Laterality Date   BARIATRIC SURGERY N/A    COLONOSCOPY N/A 04/02/2015   Procedure: COLONOSCOPY;  Surgeon: Elnita Maxwell, MD;  Location: [facility] ENDOSCOPY;  Service: Endoscopy;  Laterality: N/A;   COLONOSCOPY WITH PROPOFOL N/A 12/01/2014   Procedure: COLONOSCOPY WITH PROPOFOL;  Surgeon: Elnita Maxwell, MD;  Location: [facility] ENDOSCOPY;  Service: Endoscopy;  Laterality: N/A;   COLONOSCOPY WITH PROPOFOL N/A 10/27/2018   Procedure: COLONOSCOPY WITH PROPOFOL;  Surgeon: Toledo, Boykin Nearing, MD;  Location: [facility] ENDOSCOPY;  Service: Gastroenterology;  Laterality: N/A;   CORONARY STENT INTERVENTION N/A 12/01/2017   Procedure: CORONARY STENT INTERVENTION;  Surgeon: Marcina Millard, MD;  Location: [facility] INVASIVE CV LAB;  Service: Cardiovascular;  Laterality: N/A;   ESOPHAGOGASTRODUODENOSCOPY (EGD) WITH PROPOFOL N/A 10/27/2018   Procedure: ESOPHAGOGASTRODUODENOSCOPY (EGD) WITH PROPOFOL;  Surgeon: Toledo, Boykin Nearing, MD;  Location: [facility] ENDOSCOPY;  Service: Gastroenterology;  Laterality: N/A;   LEFT HEART CATH AND CORONARY ANGIOGRAPHY Left 12/01/2017   Procedure: LEFT HEART CATH AND CORONARY ANGIOGRAPHY;  Surgeon: Lamar Blinks, MD;  Location: [facility] INVASIVE CV LAB;   Service: Cardiovascular;  Laterality: Left;   LOWER EXTREMITY ANGIOGRAPHY Right 12/17/2017   Procedure: LOWER EXTREMITY ANGIOGRAPHY;  Surgeon: Annice Needy, MD;  Location: [facility] INVASIVE CV LAB;  Service: Cardiovascular;  Laterality: Right;   PNEUMONECTOMY Left    Social History:  reports that he quit smoking about 23 years ago. His smoking use included cigarettes. He started smoking about 71 years ago. He has a 144 pack-year smoking history. He has never used smokeless tobacco. He reports current alcohol use. He reports that he does not use drugs.  Allergies  Allergen Reactions   Latex Rash   Family History  Problem Relation Age of Onset   Stroke Mother    Heart disease Father    Heart disease Paternal Grandfather    Family history: Family history reviewed and not pertinent.  Prior to Admission medications   Medication Sig Start Date End Date Taking? Authorizing Provider  albuterol (ACCUNEB) 0.63 MG/3ML nebulizer solution Take 1 ampule by nebulization every 4 (four) hours as needed for wheezing.   Yes [provider]  albuterol (VENTOLIN HFA) 108 (90 Base) MCG/ACT inhaler Ventolin HFA 90 mcg/actuation aerosol inhaler  INHALE 2 INHALATIONS INTO THE LUNGS EVERY 4 (FOUR) HOURS AS NEEDED FOR WHEEZING   Yes [provider]  ALPRAZolam (XANAX) 0.25 MG tablet Take 0.25 mg by mouth at bedtime as needed for anxiety.   Yes [provider]  ascorbic acid (VITAMIN C) 250 MG tablet Take 250 mg by mouth daily.   Yes [provider]  aspirin 81 MG chewable tablet Chew 1  tablet (81 mg total) by mouth daily. 12/02/17  Yes Lamar Blinks, MD  atorvastatin (LIPITOR) 40 MG tablet Take 1 tablet (40 mg total) by mouth daily. 12/01/17  Yes Lamar Blinks, MD  budesonide-formoterol [facility]) 160-4.5 MCG/ACT inhaler Inhale 2 puffs into the lungs daily as needed (SHORTNESS OF BREATH).    Yes [provider]  ferrous sulfate 325 (65 FE) MG tablet Take 325 mg by mouth  2 (two) times daily with a meal.    Yes [provider]  gabapentin (NEURONTIN) 300 MG capsule Take 300 mg by mouth at bedtime.   Yes [provider]  losartan (COZAAR) 25 MG tablet Take 1 tablet by mouth daily.   Yes [provider]  Multiple Vitamin (MULTIVITAMIN) tablet Take 1 tablet by mouth daily.   Yes [provider]  nitroGLYCERIN (NITROSTAT) 0.4 MG SL tablet Place 0.4 mg under the tongue every 5 (five) minutes as needed.    Yes [provider]  oxyCODONE-acetaminophen (PERCOCET) 7.5-325 MG per tablet Take 1 tablet by mouth 2 (two) times daily as needed for severe pain.   Yes [provider]  ranolazine (RANEXA) 500 MG 12 hr tablet Take 500 mg by mouth 2 (two) times daily.   Yes [provider]  tiZANidine (ZANAFLEX) 2 MG tablet Take 2 mg by mouth every 6 (six) hours as needed for muscle spasms. 07/25/21  Yes [provider]  torsemide (DEMADEX) 20 MG tablet TAKE 1 TABLET BY MOUTH EVERY DAY 11/11/18  Yes [provider]  traMADol (ULTRAM) 50 MG tablet Take 50 mg by mouth every 12 (twelve) hours as needed for moderate pain.   Yes [provider]  amLODipine (NORVASC) 2.5 MG tablet Take 1 tablet (2.5 mg total) by mouth daily. Patient not taking: Reported on 01/01/2023 12/01/17   Lamar Blinks, MD  carvedilol (COREG) 6.25 MG tablet Take by mouth. Patient not taking: Reported on 01/01/2023 04/04/20 04/04/21  [provider]  cyclobenzaprine (FLEXERIL) 5 MG tablet Take 5 mg by mouth as needed. Patient not taking: Reported on 04/18/2020 06/04/18   [provider]  DULoxetine (CYMBALTA) 30 MG capsule Take by mouth daily. Patient not taking: Reported on 04/18/2020 08/16/18   [provider]  isosorbide mononitrate (IMDUR) 30 MG 24 hr tablet  10/16/18   [provider]  metoprolol succinate (TOPROL-XL) 25 MG 24 hr tablet Take 25 mg by mouth daily. Patient not taking: Reported on  01/01/2023 02/15/20   [provider]  oxyCODONE (OXY IR/ROXICODONE) 5 MG immediate release tablet oxycodone 5 mg tablet  TAKE ONE TABLET BY MOUTH EVERY 6 HOURS Patient not taking: Reported on 01/01/2023    [provider]  pantoprazole (PROTONIX) 40 MG tablet Take by mouth. Patient not taking: Reported on 01/01/2023 11/01/18   [provider]  potassium chloride (KLOR-CON) 10 MEQ tablet Take 10 mEq by mouth daily.  Patient not taking: Reported on 01/01/2023    [provider]  tamsulosin (FLOMAX) 0.4 MG CAPS capsule  10/18/18   [provider]  telmisartan (MICARDIS) 20 MG tablet TAKE 1 TABLET BY MOUTH EVERY DAY Patient not taking: Reported on 01/01/2023 12/06/18   [provider]  vitamin E 180 MG (400 UNITS) capsule Take by mouth. Patient not taking: Reported on 01/01/2023    [provider]   Physical Exam: Vitals:   01/01/23 2130 01/01/23 2155 01/01/23 2200 01/01/23 2230  BP: (!) 129/95  116/79 104/85  Pulse: 68 72 (!)  57 63  Resp: 18 16 18 15   Temp:      TempSrc:      SpO2: 98% 98% 95% 95%  Weight:      Height:       Constitutional: appears age appropriate, NAD, calm Eyes: PERRL, lids and conjunctivae normal ENMT: Mucous membranes are moist. Posterior pharynx clear of any exudate or lesions. Age-appropriate dentition. Hearing appropriate Neck: normal, supple, no masses, no thyromegaly Respiratory: clear to auscultation bilaterally, no wheezing, no crackles. Normal respiratory effort. No accessory muscle use.  Cardiovascular: Regular rate and rhythm, no murmurs / rubs / gallops. Bilateral 2+ pitting extremity edema. 2+ pedal pulses. No carotid bruits.  Abdomen: no tenderness, no masses palpated, no hepatosplenomegaly. Bowel sounds positive.  Musculoskeletal: no clubbing / cyanosis. No joint deformity upper and lower extremities. Good ROM, no contractures, no atrophy. Normal muscle tone.  Skin: no rashes, lesions, ulcers. No  induration Neurologic: Sensation intact. Strength 5/5 in all 4.  Psychiatric: Normal judgment and insight. Alert and oriented x 3. Normal mood.   EKG: independently reviewed, showing atrial fibrillation with rate of 136, qtc 502  Chest x-ray on Admission: I personally reviewed and I agree with radiologist reading as below.  US Venous Img Lower Bilateral (DVT)  Result Date: 01/01/2023 CLINICAL DATA:  Leg swelling. EXAM: Bilateral LOWER EXTREMITY VENOUS DOPPLER ULTRASOUND TECHNIQUE: Gray-scale sonography with compression, as well as color and duplex ultrasound, were performed to evaluate the deep venous system(s) from the level of the common femoral vein through the popliteal and proximal calf veins. COMPARISON:  None Available. FINDINGS: VENOUS Normal compressibility of the common femoral, superficial femoral, and popliteal veins, as well as the visualized calf veins. Visualized portions of profunda femoral vein and great saphenous vein unremarkable. No filling defects to suggest DVT on grayscale or color Doppler imaging. Doppler waveforms show normal direction of venous flow, normal respiratory plasticity and response to augmentation. Limited views of the contralateral common femoral vein are unremarkable. OTHER None. Limitations: none IMPRESSION: No evidence of bilateral lower extremity DVT Electronically Signed   By: Karen Kays M.D.   On: 01/01/2023 18:41   DG Chest Portable 1 View  Result Date: 01/01/2023 CLINICAL DATA:  Atrial fibrillation with shortness of breath. EXAM: PORTABLE CHEST 1 VIEW COMPARISON:  October 02, 2022. FINDINGS: The cardiac silhouette is mildly enlarged and unchanged in size. There is moderate severity calcification of the aortic arch. Mild, diffuse, chronic appearing increased interstitial lung markings are noted. There is mild, predominately perihilar prominence of the pulmonary vasculature. Mild atelectasis is seen within the bilateral lung bases. No pleural effusion or  pneumothorax is identified. Multilevel degenerative changes are noted throughout the thoracic spine. IMPRESSION: 1. Mild cardiomegaly with mild pulmonary vascular congestion. 2. Mild bibasilar atelectasis. Electronically Signed   By: Aram Candela M.D.   On: 01/01/2023 15:15    Labs on Admission: I have personally reviewed following labs  CBC: Recent Labs  Lab 01/01/23 1228  WBC 7.7  HGB 12.3*  HCT 38.4*  MCV 91.4  PLT 264   Basic Metabolic Panel: Recent Labs  Lab 01/01/23 1228  NA 138  K 4.3  CL 100  CO2 27  GLUCOSE 98  BUN 22  CREATININE 0.94  CALCIUM 9.2   GFR: Estimated Creatinine Clearance: 85.3 mL/min (by C-G formula based on SCr of 0.94 mg/dL).  Urine analysis:    Component Value Date/Time   COLORURINE Yellow 07/15/2013 1029   APPEARANCEUR Clear 07/15/2013 1029   LABSPEC 1.013  07/15/2013 1029   PHURINE 6.0 07/15/2013 1029   GLUCOSEU Negative 07/15/2013 1029   HGBUR 1+ 07/15/2013 1029   BILIRUBINUR Negative 07/15/2013 1029   KETONESUR Negative 07/15/2013 1029   PROTEINUR 30 mg/dL 16/01/9603 5409   NITRITE Negative 07/15/2013 1029   LEUKOCYTESUR 3+ 07/15/2013 1029   This document was prepared using Dragon Voice Recognition software and may include unintentional dictation errors.  Dr. Sedalia Muta [facility]  If 7PM-7AM, please contact overnight-coverage provider If 7AM-7PM, please contact day attending provider [email or web address]  01/01/2023, 11:02 PM

## 2023-01-01 NOTE — Assessment & Plan Note (Signed)
Atorvastatin 40 mg nightly presumed

## 2023-01-02 ENCOUNTER — Inpatient Hospital Stay (HOSPITAL_COMMUNITY)
Admit: 2023-01-02 | Discharge: 2023-01-02 | Disposition: A | Discharge status: Home / Self Care | Payer: Medicare HMO | Attending: Internal Medicine | Admitting: Internal Medicine

## 2023-01-02 ENCOUNTER — Encounter: Payer: Self-pay | Admitting: Urgent Care

## 2023-01-02 ENCOUNTER — Other Ambulatory Visit (HOSPITAL_COMMUNITY): Payer: Self-pay

## 2023-01-02 ENCOUNTER — Encounter: Payer: Self-pay | Admitting: Internal Medicine

## 2023-01-02 DIAGNOSIS — I509 Heart failure, unspecified: Secondary | ICD-10-CM | POA: Diagnosis not present

## 2023-01-02 DIAGNOSIS — I5023 Acute on chronic systolic (congestive) heart failure: Secondary | ICD-10-CM

## 2023-01-02 DIAGNOSIS — R0609 Other forms of dyspnea: Secondary | ICD-10-CM

## 2023-01-02 LAB — CBC
HCT: 36.9 % — ABNORMAL LOW (ref 39.0–52.0)
Hemoglobin: 12.1 g/dL — ABNORMAL LOW (ref 13.0–17.0)
MCH: 29.7 pg (ref 26.0–34.0)
MCHC: 32.8 g/dL (ref 30.0–36.0)
MCV: 90.4 fL (ref 80.0–100.0)
Platelets: 239 10*3/uL (ref 150–400)
RBC: 4.08 MIL/uL — ABNORMAL LOW (ref 4.22–5.81)
RDW: 13.9 % (ref 11.5–15.5)
WBC: 5.5 10*3/uL (ref 4.0–10.5)
nRBC: 0 % (ref 0.0–0.2)

## 2023-01-02 LAB — ECHOCARDIOGRAM COMPLETE
AR max vel: 2.42 cm2
AV Area VTI: 2.53 cm2
AV Area mean vel: 2.43 cm2
AV Mean grad: 4 mmHg
AV Peak grad: 8 mmHg
Ao pk vel: 1.42 m/s
Area-P 1/2: 6.71 cm2
Height: 64 in
MV VTI: 3.51 cm2
S' Lateral: 4.9 cm
Weight: 3568 [oz_av]

## 2023-01-02 LAB — BASIC METABOLIC PANEL
Anion gap: 9 (ref 5–15)
BUN: 21 mg/dL (ref 8–23)
CO2: 29 mmol/L (ref 22–32)
Calcium: 8.9 mg/dL (ref 8.9–10.3)
Chloride: 100 mmol/L (ref 98–111)
Creatinine, Ser: 0.76 mg/dL (ref 0.61–1.24)
GFR, Estimated: >60 mL/min (ref >60)
Glucose, Bld: 88 mg/dL (ref 70–99)
Potassium: 3.9 mmol/L (ref 3.5–5.1)
Sodium: 138 mmol/L (ref 135–145)

## 2023-01-02 LAB — APTT: aPTT: 36 s (ref 24–36)

## 2023-01-02 LAB — PROTIME-INR
INR: 1.1 (ref 0.8–1.2)
Prothrombin Time: 14.3 s (ref 11.4–15.2)

## 2023-01-02 MED ORDER — FUROSEMIDE 10 MG/ML IJ SOLN
60.0000 mg | Freq: Two times a day (BID) | INTRAMUSCULAR | Status: DC
  Administered 2023-01-02 – 2023-01-04 (×5): 60 mg via INTRAVENOUS
  Filled 2023-01-02 (×6): qty 6

## 2023-01-02 MED ORDER — DILTIAZEM HCL 60 MG PO TABS
30.0000 mg | ORAL_TABLET | Freq: Three times a day (TID) | ORAL | Status: DC
  Administered 2023-01-02: 30 mg via ORAL
  Filled 2023-01-02: qty 1

## 2023-01-02 MED ORDER — MAGNESIUM SULFATE 2 GM/50ML IV SOLN
2.0000 g | Freq: Once | INTRAVENOUS | Status: AC
  Administered 2023-01-02: 2 g via INTRAVENOUS
  Filled 2023-01-02: qty 50

## 2023-01-02 MED ORDER — FUROSEMIDE 10 MG/ML IJ SOLN: 40.0000 mg | Freq: Two times a day (BID) | INTRAMUSCULAR | Status: DC

## 2023-01-02 MED ORDER — INFLUENZA VIRUS VACC SPLIT PF (FLUZONE) 0.5 ML IM SUSY
0.5000 mL | PREFILLED_SYRINGE | INTRAMUSCULAR | Status: DC
  Filled 2023-01-02: qty 0.5

## 2023-01-02 MED ORDER — DAPAGLIFLOZIN PROPANEDIOL 10 MG PO TABS
10.0000 mg | ORAL_TABLET | Freq: Every day | ORAL | Status: DC
  Administered 2023-01-02 – 2023-01-06 (×4): 10 mg via ORAL
  Filled 2023-01-02 (×5): qty 1

## 2023-01-02 MED ORDER — HEPARIN (PORCINE) 25000 UT/250ML-% IV SOLN
1250.0000 [IU]/h | INTRAVENOUS | Status: DC
  Administered 2023-01-02: 1400 [IU]/h via INTRAVENOUS
  Administered 2023-01-03 – 2023-01-04 (×2): 1250 [IU]/h via INTRAVENOUS
  Filled 2023-01-02 (×3): qty 250

## 2023-01-02 MED ORDER — APIXABAN 5 MG PO TABS
5.0000 mg | ORAL_TABLET | Freq: Two times a day (BID) | ORAL | Status: DC
  Administered 2023-01-02: 5 mg via ORAL
  Filled 2023-01-02: qty 1

## 2023-01-02 MED ORDER — FUROSEMIDE 10 MG/ML IJ SOLN
60.0000 mg | Freq: Once | INTRAMUSCULAR | Status: AC
  Administered 2023-01-02: 60 mg via INTRAVENOUS
  Filled 2023-01-02: qty 8

## 2023-01-02 MED ORDER — BUTALBITAL-APAP-CAFFEINE 50-325-40 MG PO TABS
1.0000 | ORAL_TABLET | ORAL | Status: DC | PRN
  Administered 2023-01-02 (×2): 1 via ORAL
  Filled 2023-01-02 (×2): qty 1

## 2023-01-02 MED ORDER — ASPIRIN 81 MG PO TBEC
81.0000 mg | DELAYED_RELEASE_TABLET | Freq: Every day | ORAL | Status: DC
  Administered 2023-01-02 – 2023-01-06 (×4): 81 mg via ORAL
  Filled 2023-01-02 (×4): qty 1

## 2023-01-02 MED ORDER — CARVEDILOL 25 MG PO TABS
25.0000 mg | ORAL_TABLET | Freq: Two times a day (BID) | ORAL | Status: DC
  Administered 2023-01-02 – 2023-01-06 (×7): 25 mg via ORAL
  Filled 2023-01-02 (×7): qty 1

## 2023-01-02 MED ORDER — PERFLUTREN LIPID MICROSPHERE
1.0000 mL | INTRAVENOUS | Status: AC | PRN
  Administered 2023-01-02: 5 mL via INTRAVENOUS

## 2023-01-02 MED ORDER — ALBUTEROL SULFATE HFA 108 (90 BASE) MCG/ACT IN AERS: 1.0000 | INHALATION_SPRAY | RESPIRATORY_TRACT | Status: DC | PRN

## 2023-01-02 MED ORDER — HEPARIN BOLUS VIA INFUSION
4000.0000 [IU] | Freq: Once | INTRAVENOUS | Status: AC
  Administered 2023-01-02: 4000 [IU] via INTRAVENOUS
  Filled 2023-01-02: qty 4000

## 2023-01-02 MED ORDER — CARVEDILOL 6.25 MG PO TABS
12.5000 mg | ORAL_TABLET | Freq: Two times a day (BID) | ORAL | Status: DC
  Administered 2023-01-02: 12.5 mg via ORAL
  Filled 2023-01-02: qty 2

## 2023-01-02 NOTE — TOC Benefit Eligibility Note (Signed)
Pharmacy Patient Advocate Encounter  Insurance verification completed.    The patient is insured through [employer] test claim for Eliquis. Currently a quantity of 60 is a 30 day supply and the co-pay is $47.00 .   This test claim was processed through [facility]- copay amounts may vary at other pharmacies due to pharmacy/plan contracts, or as the patient moves through the different stages of their insurance plan.

## 2023-01-02 NOTE — Progress Notes (Signed)
*  PRELIMINARY RESULTS* Echocardiogram 2D Echocardiogram has been performed.  Spencer Herring 01/02/2023, 1:06 PM

## 2023-01-02 NOTE — Progress Notes (Signed)
[facility] HF Stewardship  PCP: Barbette Reichmann, MD  PCP-Cardiologist: None  HPI: Spencer Herring is a 67 y.o. male with CAD status post PCI, hypertension, hyperlipidemia, anxiety, angina who presented with shortness of breath, fatigue, and bilateral leg swelling. Found to be in Afib with RVR in the ED. Troponin on admission was 9. BNP on admission was 226.2.  TTE ordered.   Pertinent cardiac history: Positive stress test in 10/2017. Underwent cardiac cath 11/2017 and found to have RPDA 85% stenosed, Ost 1st Diag to 1st Diag 75% stenosed, prox LAD 85% stenosed, Mid Cx 75% stenosed, Dist Cx 60% stenosed, and other minor nonobstructive lesions. Underwent successful PCI with DES to proximal LAD. Cardiac cath in 01/2018 showed 90% lesion at OM2 bifurcation treated with DES. Another cardiac cath 01/2018 found 90% 2nd Mrg lesion treated with DES. CMRA in 01/2020 showed LVEF of 62%, mild tricuspid, mitral, and pulmonic regurgitation with no evidence of scarring.   Pertinent Lab Values: Creatinine  Date Value Ref Range Status  05/04/2014 0.99 0.60 - 1.30 mg/dL Final   Creatinine, Ser  Date Value Ref Range Status  01/02/2023 0.76 0.61 - 1.24 mg/dL Final   BUN  Date Value Ref Range Status  01/02/2023 21 8 - 23 mg/dL Final  43/32/9518 21 (H) 7 - 18 mg/dL Final   Potassium  Date Value Ref Range Status  01/02/2023 3.9 3.5 - 5.1 mmol/L Final  05/04/2014 5.5 (H) 3.5 - 5.1 mmol/L Final   Sodium  Date Value Ref Range Status  01/02/2023 138 135 - 145 mmol/L Final  05/04/2014 137 136 - 145 mmol/L Final   B Natriuretic Peptide  Date Value Ref Range Status  01/01/2023 226.2 (H) 0.0 - 100.0 pg/mL Final    Comment:    Performed at [facility], [address] [place], [place] [address]   Magnesium  Date Value Ref Range Status  01/05/2013 1.8 mg/dL Final    Comment:    0.6-3.0 THERAPEUTIC RANGE: 4-7 mg/dL TOXIC: > 10 mg/dL  -----------------------    Hemoglobin A1C  Date Value Ref  Range Status  10/15/2012 5.6 4.2 - 6.3 % Final    Comment:    The American Diabetes Association recommends that a primary goal of therapy should be <7% and that physicians should reevaluate the treatment regimen in patients with HbA1c values consistently >8%.    Thyroid Stimulating Horm  Date Value Ref Range Status  10/15/2012 1.75 uIU/mL Final    Comment:    0.45-4.50 (International Unit)  ----------------------- Pregnant patients have  different reference  ranges for TSH:  - - - - - - - - - -  Pregnant, first trimetser:  0.36 - 2.50 uIU/mL     Vital Signs: Temp:  [97 F (36.1 C)-98 F (36.7 C)] 97 F (36.1 C) (09/06 0429) Pulse Rate:  [44-140] 44 (09/06 0600) Cardiac Rhythm: Atrial fibrillation (09/05 2319) Resp:  [12-26] 14 (09/06 0600) BP: (92-150)/(74-108) 123/85 (09/06 0600) SpO2:  [90 %-100 %] 98 % (09/06 0600) Weight:  [101.2 kg (223 lb)] 101.2 kg (223 lb) (09/05 1225)   Intake/Output Summary (Last 24 hours) at 01/02/2023 1009 Last data filed at 01/01/2023 1425 Gross per 24 hour  Intake 500 ml  Output --  Net 500 ml    Current Inpatient Medications:  Carvedilol 12.5 mg BID Losartan 25 mg daily  Prior to admission HF Medications:  Loop Diuretic: Torsemide 20 mg daily Angiotensin-Converting Enzyme Inhibitor (ACEi) or Angiotensin II Receptor Blocker (ARB): Losartan 25 mg  daily  Assessment: 1. Acute on chronic Diastolic heart failure (LVEF 62% in 2021), due to mixed cardiomyopathy. NYHA class III symptoms.  -BP this AM stable 120s/80s. NSR with HR in 50s.  -Denies SOB at rest. Significant LEE present on exam. Would benefit from TED hose. No loop diuretic ordered at this time. Would likely benefit from 2-2.5 x home dose (DOSE Trial) -Given HFpEF, would benefit most from SGLT2i and MRA. Both would help to augment diuresis.  Plan: 1) Medication changes recommended at this time: -Consider adding Farxiga 10 mg daily for HFpEF. -Consider furosemide 60 mg IV   -Consider placing TED hose  2) Patient assistance: -Farxiga, Jardiance, and entresto copays are $47. Not eligible for copay cards.  3) Education: -- Patient has been educated on current HF medications and potential additions to HF medication regimen - Patient verbalizes understanding that over the next few months, these medication doses may change and more medications may be added to optimize HF regimen - Patient has been educated on basic disease state pathophysiology and goals of therapy   Medication Assistance / Insurance Benefits Check:  Does the patient have prescription insurance?    Type of insurance plan:   Does the patient qualify for medication assistance through [facility]   Outpatient Pharmacy:  Prior to admission outpatient pharmacy: [facility]     Thank you for involving pharmacy in this patient's care.  Enos Fling, PharmD, BCPS Phone - [phone] Clinical Pharmacist 01/02/2023 10:18 AM

## 2023-01-02 NOTE — Progress Notes (Signed)
PROGRESS NOTE    Spencer Herring  QMV:[id number] DOB: 04/28/1956 DOA: 01/01/2023 PCP: Barbette Reichmann, MD    Brief Narrative:  67 year old male with history of CAD status post PCI, hypertension, hyperlipidemia, anxiety, angina, who presents to the emergency department for chief concerns of shortness of breath and fatigue with bilateral leg swelling.He reports shortness of breath and chest pain worse with exertion. He denies current chest pain and shortness of breath currently at bedside. He endorses fatigue. These symptoms improve with rest.  He denies trauma to his person, including chest area. He endorses compliance with home medications.  He endorses associated bilateral lower extremity edema.  He endorses feelings of fever, with tmax of 100 at home, chills, and nausea. He did not take anything at home for these symptoms. He denies vomiting.   9/6: Seen in consultation by cardiology.  Recommendations appreciated.  Left ventricular ejection fraction markedly reduced on direct visualization by cardiologist.  Patient started on Lasix 60 mg IV twice daily.  Cardizem discontinued.  Contraindicated the setting of reduced ejection fraction.       Assessment & Plan:   Principal Problem:   Acute exacerbation of CHF (congestive heart failure) (HCC) Active Problems:   CAD (coronary artery disease)   Morbid obesity (HCC)   Bilateral lower extremity edema   Iron deficiency anemia following bariatric surgery   Hyperlipidemia  Acute exacerbation of systolic congestive heart failure Left-ventricular ejection fraction markedly reduced on direct visualization by cardiologist.  Formal evaluation and read is pending.  Received 20 mg IV Lasix in ED. Plan: Lasix 60 mg IV twice daily Strict ins and outs Daily weights Plan for cardiac catheterization early next week  New onset atrial fibrillation with rapid ventricular response Patient started on Cardizem gtt.  Now discontinued and will avoid  diltiazem given reduced ejection fraction. Plan: Coreg 25 mg p.o. twice daily Heparin GTT for anticoagulation Transition to p.o. Eliquis prior to discharge Telemetry monitor  Hyperlipidemia Statin   Iron deficiency anemia following bariatric surgery Home iron replacement   Morbid obesity (HCC) This meets criteria for morbid obesity based on the presence of 1 or more chronic comorbidities. Patient has BMI of 38.28 and CAD. This complicates overall care and prognosis.    CAD (coronary artery disease) Aspirin 81 mg daily,  atorvastatin 40 mg nightly,  losartan 25 mg daily Ranexa 500 mg p.o. twice daily resumed   DVT prophylaxis: IV heparin Code Status: Full Family Communication: None today Disposition Plan: Status is: Inpatient Remains inpatient appropriate because: Heart exacerbation, rapid atrial fibrillation   Level of care: Telemetry Cardiac  Consultants:  Cardiology  Procedures:  None  Antimicrobials: None   Subjective: Seen and examined.  Sitting up on edge of bed.  Main complaint is headache.  Objective: Vitals:   01/02/23 1132 01/02/23 1146 01/02/23 1205 01/02/23 1425  BP: (!) 133/111 (!) 133/111 126/88 (!) 126/93  Pulse: (!) 101  (!) 121 (!) 111  Resp:    14  Temp:      TempSrc:      SpO2:   100% 96%  Weight:      Height:       No intake or output data in the 24 hours ending 01/02/23 1443 Filed Weights   01/01/23 1225  Weight: 101.2 kg    Examination:  General exam: Appears fatigued Respiratory system: Scattered crackles bilaterally.  Normal work of breathing.  2 L Cardiovascular system: Tachycardic, regular rhythm, no murmurs, 2+ pitting edema BLE Gastrointestinal system: Obese,  soft, NT/ND, normal bowel sounds Central nervous system: Alert and oriented. No focal neurological deficits. Extremities: Symmetric 5 x 5 power. Skin: No rashes, lesions or ulcers Psychiatry: Judgement and insight appear normal. Mood & affect appropriate.      Data Reviewed: I have personally reviewed following labs and imaging studies  CBC: Recent Labs  Lab 01/01/23 1228 01/02/23 0428  WBC 7.7 5.5  HGB 12.3* 12.1*  HCT 38.4* 36.9*  MCV 91.4 90.4  PLT 264 239   Basic Metabolic Panel: Recent Labs  Lab 01/01/23 1228 01/02/23 0428  NA 138 138  K 4.3 3.9  CL 100 100  CO2 27 29  GLUCOSE 98 88  BUN 22 21  CREATININE 0.94 0.76  CALCIUM 9.2 8.9   GFR: Estimated Creatinine Clearance: 100.3 mL/min (by C-G formula based on SCr of 0.76 mg/dL). Liver Function Tests: No results for input(s): "AST", "ALT", "ALKPHOS", "BILITOT", "PROT", "ALBUMIN" in the last 168 hours. No results for input(s): "LIPASE", "AMYLASE" in the last 168 hours. No results for input(s): "AMMONIA" in the last 168 hours. Coagulation Profile: Recent Labs  Lab 01/02/23 1331  INR 1.1   Cardiac Enzymes: No results for input(s): "CKTOTAL", "CKMB", "CKMBINDEX", "TROPONINI" in the last 168 hours. BNP (last 3 results) No results for input(s): "PROBNP" in the last 8760 hours. HbA1C: No results for input(s): "HGBA1C" in the last 72 hours. CBG: No results for input(s): "GLUCAP" in the last 168 hours. Lipid Profile: No results for input(s): "CHOL", "HDL", "LDLCALC", "TRIG", "CHOLHDL", "LDLDIRECT" in the last 72 hours. Thyroid Function Tests: No results for input(s): "TSH", "T4TOTAL", "FREET4", "T3FREE", "THYROIDAB" in the last 72 hours. Anemia Panel: No results for input(s): "VITAMINB12", "FOLATE", "FERRITIN", "TIBC", "IRON", "RETICCTPCT" in the last 72 hours. Sepsis Labs: No results for input(s): "PROCALCITON", "LATICACIDVEN" in the last 168 hours.  Recent Results (from the past 240 hour(s))  Resp panel by RT-PCR (RSV, Flu A&B, Covid) Anterior Nasal Swab     Status: None   Collection Time: 01/01/23  1:04 PM   Specimen: Anterior Nasal Swab  Result Value Ref Range Status   SARS Coronavirus 2 by RT PCR NEGATIVE NEGATIVE Final    Comment: (NOTE) SARS-CoV-2  target nucleic acids are NOT DETECTED.  The SARS-CoV-2 RNA is generally detectable in upper respiratory specimens during the acute phase of infection. The lowest concentration of SARS-CoV-2 viral copies this assay can detect is 138 copies/mL. A negative result does not preclude SARS-Cov-2 infection and should not be used as the sole basis for treatment or other patient management decisions. A negative result may occur with  improper specimen collection/handling, submission of specimen other than nasopharyngeal swab, presence of viral mutation(s) within the areas targeted by this assay, and inadequate number of viral copies(<138 copies/mL). A negative result must be combined with clinical observations, patient history, and epidemiological information. The expected result is Negative.  Fact Sheet for Patients:  BloggerCourse.com  Fact Sheet for Healthcare Providers:  SeriousBroker.it  This test is no t yet approved or cleared by the [place] FDA and  has been authorized for detection and/or diagnosis of SARS-CoV-2 by FDA under an Emergency Use Authorization (EUA). This EUA will remain  in effect (meaning this test can be used) for the duration of the COVID-19 declaration under Section 564(b)(1) of the Act, 21 U.S.C.section 360bbb-3(b)(1), unless the authorization is terminated  or revoked sooner.       Influenza A by PCR NEGATIVE NEGATIVE Final   Influenza B by PCR NEGATIVE NEGATIVE Final  Comment: (NOTE) The Xpert Xpress SARS-CoV-2/FLU/RSV plus assay is intended as an aid in the diagnosis of influenza from Nasopharyngeal swab specimens and should not be used as a sole basis for treatment. Nasal washings and aspirates are unacceptable for Xpert Xpress SARS-CoV-2/FLU/RSV testing.  Fact Sheet for Patients: BloggerCourse.com  Fact Sheet for Healthcare  Providers: SeriousBroker.it  This test is not yet approved or cleared by the [place] FDA and has been authorized for detection and/or diagnosis of SARS-CoV-2 by FDA under an Emergency Use Authorization (EUA). This EUA will remain in effect (meaning this test can be used) for the duration of the COVID-19 declaration under Section 564(b)(1) of the Act, 21 U.S.C. section 360bbb-3(b)(1), unless the authorization is terminated or revoked.     Resp Syncytial Virus by PCR NEGATIVE NEGATIVE Final    Comment: (NOTE) Fact Sheet for Patients: BloggerCourse.com  Fact Sheet for Healthcare Providers: SeriousBroker.it  This test is not yet approved or cleared by the [place] FDA and has been authorized for detection and/or diagnosis of SARS-CoV-2 by FDA under an Emergency Use Authorization (EUA). This EUA will remain in effect (meaning this test can be used) for the duration of the COVID-19 declaration under Section 564(b)(1) of the Act, 21 U.S.C. section 360bbb-3(b)(1), unless the authorization is terminated or revoked.  Performed at [facility], [address], [place], [place] [address]          Radiology Studies: ECHOCARDIOGRAM COMPLETE  Result Date: 01/02/2023    ECHOCARDIOGRAM REPORT   Patient Name:   Spencer Herring Date of Exam: 01/02/2023 Medical Rec #:  [id number]       Height:       64.0 in Accession #:    [id number]      Weight:       223.0 lb Date of Birth:  1956/04/14       BSA:          2.049 m Patient Age:    66 years        BP:           126/88 mmHg Patient Gender: M               HR:           119 bpm. Exam Location:  [facility] Procedure: 2D Echo, Cardiac Doppler, Color Doppler and Intracardiac            Opacification Agent Indications:     CHF, Dyspnea  History:         Patient has no prior history of Echocardiogram examinations.                  CHF, CAD, Arrythmias:LBBB;  Signs/Symptoms:Dyspnea.  Sonographer:     Mikki Harbor Referring Phys:  [id number] AMY N COX Diagnosing Phys: Debbe Odea MD  Sonographer Comments: Patient is obese. IMPRESSIONS  1. Left ventricular ejection fraction, by estimation, is 20 to 25%. The left ventricle has severely decreased function. The left ventricle demonstrates global hypokinesis. There is mild left ventricular hypertrophy. Left ventricular diastolic parameters  are indeterminate.  2. Right ventricular systolic function is normal. The right ventricular size is mildly enlarged.  3. Left atrial size was moderately dilated.  4. The mitral valve is normal in structure. Mild to moderate mitral valve regurgitation.  5. The aortic valve was not well visualized. Aortic valve regurgitation is not visualized. Aortic valve sclerosis is present, with no evidence of aortic valve stenosis. FINDINGS  Left Ventricle: Left ventricular  ejection fraction, by estimation, is 20 to 25%. The left ventricle has severely decreased function. The left ventricle demonstrates global hypokinesis. Definity contrast agent was given IV to delineate the left ventricular endocardial borders. The left ventricular internal cavity size was normal in size. There is mild left ventricular hypertrophy. Left ventricular diastolic parameters are indeterminate. Right Ventricle: The right ventricular size is mildly enlarged. No increase in right ventricular wall thickness. Right ventricular systolic function is normal. Left Atrium: Left atrial size was moderately dilated. Right Atrium: Right atrial size was normal in size. Pericardium: There is no evidence of pericardial effusion. Mitral Valve: The mitral valve is normal in structure. Mild to moderate mitral valve regurgitation. MV peak gradient, 4.5 mmHg. The mean mitral valve gradient is 2.0 mmHg. Tricuspid Valve: The tricuspid valve is normal in structure. Tricuspid valve regurgitation is mild. Aortic Valve: The aortic valve was not  well visualized. Aortic valve regurgitation is not visualized. Aortic valve sclerosis is present, with no evidence of aortic valve stenosis. Aortic valve mean gradient measures 4.0 mmHg. Aortic valve peak gradient measures 8.0 mmHg. Aortic valve area, by VTI measures 2.53 cm. Pulmonic Valve: The pulmonic valve was normal in structure. Pulmonic valve regurgitation is mild. Aorta: The aortic root is normal in size and structure. Venous: The inferior vena cava was not well visualized. IAS/Shunts: No atrial level shunt detected by color flow Doppler.  LEFT VENTRICLE PLAX 2D LVIDd:         5.30 cm LVIDs:         4.90 cm LV PW:         1.30 cm LV IVS:        1.20 cm LVOT diam:     2.10 cm LV SV:         65 LV SV Index:   32 LVOT Area:     3.46 cm  RIGHT VENTRICLE RV Basal diam:  4.00 cm RV Mid diam:    3.30 cm RV S prime:     12.10 cm/s LEFT ATRIUM              Index        RIGHT ATRIUM           Index LA diam:        4.50 cm  2.20 cm/m   RA Area:     21.30 cm LA Vol (A2C):   114.0 ml 55.64 ml/m  RA Volume:   68.60 ml  33.48 ml/m LA Vol (A4C):   94.8 ml  46.27 ml/m LA Biplane Vol: 105.0 ml 51.24 ml/m  AORTIC VALVE                    PULMONIC VALVE AV Area (Vmax):    2.42 cm     PV Vmax:       1.13 m/s AV Area (Vmean):   2.43 cm     PV Peak grad:  5.1 mmHg AV Area (VTI):     2.53 cm AV Vmax:           141.50 cm/s AV Vmean:          87.500 cm/s AV VTI:            0.255 m AV Peak Grad:      8.0 mmHg AV Mean Grad:      4.0 mmHg LVOT Vmax:         98.80 cm/s LVOT Vmean:        61.450 cm/s  LVOT VTI:          0.186 m LVOT/AV VTI ratio: 0.73  AORTA Ao Root diam: 3.70 cm Ao Asc diam:  3.50 cm MITRAL VALVE               TRICUSPID VALVE MV Area (PHT): 6.71 cm    TR Peak grad:   15.2 mmHg MV Area VTI:   3.51 cm    TR Vmax:        195.00 cm/s MV Peak grad:  4.5 mmHg MV Mean grad:  2.0 mmHg    SHUNTS MV Vmax:       1.06 m/s    Systemic VTI:  0.19 m MV Vmean:      61.7 cm/s   Systemic Diam: 2.10 cm MV Decel Time: 113 msec  MV E velocity: 80.20 cm/s Debbe Odea MD Electronically signed by Debbe Odea MD Signature Date/Time: 01/02/2023/2:14:20 PM    Final    US Venous Img Lower Bilateral (DVT)  Result Date: 01/01/2023 CLINICAL DATA:  Leg swelling. EXAM: Bilateral LOWER EXTREMITY VENOUS DOPPLER ULTRASOUND TECHNIQUE: Gray-scale sonography with compression, as well as color and duplex ultrasound, were performed to evaluate the deep venous system(s) from the level of the common femoral vein through the popliteal and proximal calf veins. COMPARISON:  None Available. FINDINGS: VENOUS Normal compressibility of the common femoral, superficial femoral, and popliteal veins, as well as the visualized calf veins. Visualized portions of profunda femoral vein and great saphenous vein unremarkable. No filling defects to suggest DVT on grayscale or color Doppler imaging. Doppler waveforms show normal direction of venous flow, normal respiratory plasticity and response to augmentation. Limited views of the contralateral common femoral vein are unremarkable. OTHER None. Limitations: none IMPRESSION: No evidence of bilateral lower extremity DVT Electronically Signed   By: Karen Kays M.D.   On: 01/01/2023 18:41   DG Chest Portable 1 View  Result Date: 01/01/2023 CLINICAL DATA:  Atrial fibrillation with shortness of breath. EXAM: PORTABLE CHEST 1 VIEW COMPARISON:  October 02, 2022. FINDINGS: The cardiac silhouette is mildly enlarged and unchanged in size. There is moderate severity calcification of the aortic arch. Mild, diffuse, chronic appearing increased interstitial lung markings are noted. There is mild, predominately perihilar prominence of the pulmonary vasculature. Mild atelectasis is seen within the bilateral lung bases. No pleural effusion or pneumothorax is identified. Multilevel degenerative changes are noted throughout the thoracic spine. IMPRESSION: 1. Mild cardiomegaly with mild pulmonary vascular congestion. 2. Mild bibasilar  atelectasis. Electronically Signed   By: Aram Candela M.D.   On: 01/01/2023 15:15        Scheduled Meds:  ascorbic acid  250 mg Oral Daily   aspirin EC  81 mg Oral Daily   atorvastatin  40 mg Oral QHS   carvedilol  25 mg Oral BID WC   dapagliflozin propanediol  10 mg Oral Daily   ferrous sulfate  325 mg Oral BID WC   furosemide  60 mg Intravenous Q12H   gabapentin  300 mg Oral QHS   [START ON 01/03/2023] heparin  4,000 Units Intravenous Once   [START ON 01/03/2023] influenza vac split trivalent PF  0.5 mL Intramuscular Tomorrow-1000   losartan  25 mg Oral Daily   multivitamin with minerals  1 tablet Oral Daily   ranolazine  500 mg Oral BID   Continuous Infusions:  [START ON 01/03/2023] heparin       LOS: 1 day     Tresa Moore, MD [facility]  If 7PM-7AM, please contact night-coverage  01/02/2023, 2:43 PM

## 2023-01-02 NOTE — Consult Note (Signed)
ANTICOAGULATION CONSULT NOTE  Pharmacy Consult for IV Heparin Indication: chest pain/ACS and atrial fibrillation  Patient Measurements: Height: 5\' 4"  (162.6 cm) Weight: 101.2 kg (223 lb) IBW/kg (Calculated) : 59.2 Heparin Dosing Weight: 82.1 kg  Labs: Recent Labs    01/01/23 1228 01/01/23 1427 01/02/23 0428  HGB 12.3*  --  12.1*  HCT 38.4*  --  36.9*  PLT 264  --  239  CREATININE 0.94  --  0.76  TROPONINIHS 9 10  --     Estimated Creatinine Clearance: 100.3 mL/min (by C-G formula based on SCr of 0.76 mg/dL).   Medical History: Past Medical History:  Diagnosis Date   Anemia    Asthma    COPD (chronic obstructive pulmonary disease) (HCC)    Morbid obesity (HCC)    Neuromuscular disorder (HCC)    Sleep apnea     Medications:  New start apixaban 5 mg BID inpatient for Afib ordered and given x 1 dose, 01/02/23 at 1146  Assessment: 67 y/o M with medical history as above and including CAD admitted with new-onset Afib and acute HF. Pharmacy consulted to initiate and manage heparin infusion.  Baseline aPTT and PT-INR are pending. Will anticipate elevated baseline HL given recent administration of apixaban but likely fast washout.  Goal of Therapy:  Heparin level 0.3-0.7 units/ml aPTT 66 - 102 seconds Monitor platelets by anticoagulation protocol: Yes   Plan:  --Plan to start IV heparin 12 hours from administration of apixaban --Heparin 4000 unit IV bolus followed by continuous infusion at 1400 units/hr --aPTT 6 hours from initiation of infusion --Daily CBC per protocol while on IV heparin  Tressie Ellis 01/02/2023,12:23 PM

## 2023-01-02 NOTE — TOC Benefit Eligibility Note (Signed)
Pharmacy Patient Advocate Encounter  Insurance verification completed.    The patient is insured through [employer] claim for [employer]. Currently a quantity of 60 is a 30 day supply and the co-pay is $47.00 .  Ran test claim for [place]. Currently a quantity of 30 is a 30 day supply and the co-pay is $47.00 .  Ran test claim for [employer]. Currently a quantity of 30 is a 30 day supply and the co-pay is $47.00 .   This test claim was processed through [facility]- copay amounts may vary at other pharmacies due to pharmacy/plan contracts, or as the patient moves through the different stages of their insurance plan.

## 2023-01-02 NOTE — ED Notes (Signed)
Physician contacted at this time d/t heart rate dropping below 60 per order. Diltiazem stopped at this time.

## 2023-01-02 NOTE — Consult Note (Signed)
[facility] CLINIC CARDIOLOGY CONSULT NOTE       Patient ID: BENTLEI MCMOORE MRN: [id number] DOB/AGE: January 29, 1956 67 y.o.  Admit date: 01/01/2023 Referring Physician Dr. Londell Moh Primary Physician Dr. Marcello Fennel Primary Cardiologist Dr. Erskine Emery [facility]) Reason for Consultation new onset atrial fibrillation RVR, acute heart failure  HPI: Spencer Herring is a 67 y.o. male  with a past medical history of coronary artery disease s/p PCI to LAD 10/2017 and PCI to OM 2 01/2018, angina, hypertension, hyperlipidemia, anemia, anxiety who presented to the ED on 01/01/2023 for shortness of breath, leg swelling.  Cardiology is for further evaluation and management of new onset atrial fibrillation and acute heart failure.   Patient has been complaining of dyspnea on exertion in addition to leg swelling. He also admits to associated chest pain and fatigue. He states that he has been having this chest pain with exertion for about a year now. It feels the same as when he had his last stent placed. His shortness of breath is really exacerbated now with Afib but the chest pain has been present for some time.  Denies palpitations, diaphoresis, syncope, edema, PND, orthopnea.   Review of systems complete and found to be negative unless listed above    Past Medical History:  Diagnosis Date   Anemia    Asthma    COPD (chronic obstructive pulmonary disease) (HCC)    Morbid obesity (HCC)    Neuromuscular disorder (HCC)    Sleep apnea     Past Surgical History:  Procedure Laterality Date   BARIATRIC SURGERY N/A    COLONOSCOPY N/A 04/02/2015   Procedure: COLONOSCOPY;  Surgeon: Elnita Maxwell, MD;  Location: [facility] ENDOSCOPY;  Service: Endoscopy;  Laterality: N/A;   COLONOSCOPY WITH PROPOFOL N/A 12/01/2014   Procedure: COLONOSCOPY WITH PROPOFOL;  Surgeon: Elnita Maxwell, MD;  Location: [facility] ENDOSCOPY;  Service: Endoscopy;  Laterality: N/A;   COLONOSCOPY WITH PROPOFOL N/A 10/27/2018   Procedure:  COLONOSCOPY WITH PROPOFOL;  Surgeon: Toledo, Boykin Nearing, MD;  Location: [facility] ENDOSCOPY;  Service: Gastroenterology;  Laterality: N/A;   CORONARY STENT INTERVENTION N/A 12/01/2017   Procedure: CORONARY STENT INTERVENTION;  Surgeon: Marcina Millard, MD;  Location: [facility] INVASIVE CV LAB;  Service: Cardiovascular;  Laterality: N/A;   ESOPHAGOGASTRODUODENOSCOPY (EGD) WITH PROPOFOL N/A 10/27/2018   Procedure: ESOPHAGOGASTRODUODENOSCOPY (EGD) WITH PROPOFOL;  Surgeon: Toledo, Boykin Nearing, MD;  Location: [facility] ENDOSCOPY;  Service: Gastroenterology;  Laterality: N/A;   LEFT HEART CATH AND CORONARY ANGIOGRAPHY Left 12/01/2017   Procedure: LEFT HEART CATH AND CORONARY ANGIOGRAPHY;  Surgeon: Lamar Blinks, MD;  Location: [facility] INVASIVE CV LAB;  Service: Cardiovascular;  Laterality: Left;   LOWER EXTREMITY ANGIOGRAPHY Right 12/17/2017   Procedure: LOWER EXTREMITY ANGIOGRAPHY;  Surgeon: Annice Needy, MD;  Location: [facility] INVASIVE CV LAB;  Service: Cardiovascular;  Laterality: Right;   PNEUMONECTOMY Left     (Not in a hospital admission)  Social History   Socioeconomic History   Marital status: Single    Spouse name: Not on file   Number of children: Not on file   Years of education: Not on file   Highest education level: Not on file  Occupational History   Not on file  Tobacco Use   Smoking status: Former    Current packs/day: 0.00    Average packs/day: 3.0 packs/day for 48.0 years (144.0 ttl pk-yrs)    Types: Cigarettes    Start date: 04/29/1951    Quit date: 04/29/1999    Years since  quitting: 23.6   Smokeless tobacco: Never  Vaping Use   Vaping status: Never Used  Substance and Sexual Activity   Alcohol use: Yes   Drug use: No   Sexual activity: Not on file  Other Topics Concern   Not on file  Social History Narrative   Not on file   Social Determinants of Health   Financial Resource Strain: Low Risk  (11/13/2022)   Received from [facility]   Overall Financial Resource  Strain (CARDIA)    Difficulty of Paying Living Expenses: Not hard at all  Food Insecurity: No Food Insecurity (11/13/2022)   Received from [facility]   Hunger Vital Sign    Worried About Running Out of Food in the Last Year: Never true    Ran Out of Food in the Last Year: Never true  Transportation Needs: No Transportation Needs (11/13/2022)   Received from [facility] - Transportation    In the past 12 months, has lack of transportation kept you from medical appointments or from getting medications?: No    Lack of Transportation (Non-Medical): No  Physical Activity: Not on file  Stress: Not on file  Social Connections: Not on file  Intimate Partner Violence: Not on file    Family History  Problem Relation Age of Onset   Stroke Mother    Heart disease Father    Heart disease Paternal Grandfather      Vitals:   01/02/23 0429 01/02/23 0600 01/02/23 1132 01/02/23 1146  BP:  123/85 (!) 133/111 (!) 133/111  Pulse:  (!) 44 (!) 101   Resp:  14    Temp: (!) 97 F (36.1 C)     TempSrc: Oral     SpO2:  98%    Weight:      Height:        PHYSICAL EXAM General: alert and oriented, well nourished, in no acute distress. HEENT: Normocephalic and atraumatic. Neck: No JVD.  Lungs: Normal respiratory effort. Clear bilaterally to auscultation with decreased breath sounds at BL bases. No wheezes, crackles, rhonchi.  Heart: Irregular RRR. Normal S1 and S2 without gallops or murmurs.  Abdomen: Non-distended appearing.  Msk: Normal strength and tone for age. Extremities: Warm and well perfused. No clubbing, cyanosis.  +edema.  Neuro: Alert and oriented X 3. Psych: Answers questions appropriately.   Labs: Basic Metabolic Panel: Recent Labs    01/01/23 1228 01/02/23 0428  NA 138 138  K 4.3 3.9  CL 100 100  CO2 27 29  GLUCOSE 98 88  BUN 22 21  CREATININE 0.94 0.76  CALCIUM 9.2 8.9   Liver Function Tests: No results for input(s):  "AST", "ALT", "ALKPHOS", "BILITOT", "PROT", "ALBUMIN" in the last 72 hours. No results for input(s): "LIPASE", "AMYLASE" in the last 72 hours. CBC: Recent Labs    01/01/23 1228 01/02/23 0428  WBC 7.7 5.5  HGB 12.3* 12.1*  HCT 38.4* 36.9*  MCV 91.4 90.4  PLT 264 239   Cardiac Enzymes: Recent Labs    01/01/23 1228 01/01/23 1427  TROPONINIHS 9 10   BNP: Recent Labs    01/01/23 1228  BNP 226.2*   D-Dimer: No results for input(s): "DDIMER" in the last 72 hours. Hemoglobin A1C: No results for input(s): "HGBA1C" in the last 72 hours. Fasting Lipid Panel: No results for input(s): "CHOL", "HDL", "LDLCALC", "TRIG", "CHOLHDL", "LDLDIRECT" in the last 72 hours. Thyroid Function Tests: No results for input(s): "TSH", "T4TOTAL", "T3FREE", "  THYROIDAB" in the last 72 hours.  Invalid input(s): "FREET3" Anemia Panel: No results for input(s): "VITAMINB12", "FOLATE", "FERRITIN", "TIBC", "IRON", "RETICCTPCT" in the last 72 hours.   Radiology: US Venous Img Lower Bilateral (DVT)  Result Date: 01/01/2023 CLINICAL DATA:  Leg swelling. EXAM: Bilateral LOWER EXTREMITY VENOUS DOPPLER ULTRASOUND TECHNIQUE: Gray-scale sonography with compression, as well as color and duplex ultrasound, were performed to evaluate the deep venous system(s) from the level of the common femoral vein through the popliteal and proximal calf veins. COMPARISON:  None Available. FINDINGS: VENOUS Normal compressibility of the common femoral, superficial femoral, and popliteal veins, as well as the visualized calf veins. Visualized portions of profunda femoral vein and great saphenous vein unremarkable. No filling defects to suggest DVT on grayscale or color Doppler imaging. Doppler waveforms show normal direction of venous flow, normal respiratory plasticity and response to augmentation. Limited views of the contralateral common femoral vein are unremarkable. OTHER None. Limitations: none IMPRESSION: No evidence of bilateral lower  extremity DVT Electronically Signed   By: Karen Kays M.D.   On: 01/01/2023 18:41   DG Chest Portable 1 View  Result Date: 01/01/2023 CLINICAL DATA:  Atrial fibrillation with shortness of breath. EXAM: PORTABLE CHEST 1 VIEW COMPARISON:  October 02, 2022. FINDINGS: The cardiac silhouette is mildly enlarged and unchanged in size. There is moderate severity calcification of the aortic arch. Mild, diffuse, chronic appearing increased interstitial lung markings are noted. There is mild, predominately perihilar prominence of the pulmonary vasculature. Mild atelectasis is seen within the bilateral lung bases. No pleural effusion or pneumothorax is identified. Multilevel degenerative changes are noted throughout the thoracic spine. IMPRESSION: 1. Mild cardiomegaly with mild pulmonary vascular congestion. 2. Mild bibasilar atelectasis. Electronically Signed   By: Aram Candela M.D.   On: 01/01/2023 15:15    ECHO pending  TELEMETRY reviewed by me 01/02/2023: Afib RVR  EKG reviewed by me: atrial fibrillation RVR rate 136 bpm  Data reviewed by me 01/02/2023: last 24h vitals tele labs imaging I/O ED provider note, admission H&P  Principal Problem:   Acute exacerbation of CHF (congestive heart failure) (HCC) Active Problems:   CAD (coronary artery disease)   Morbid obesity (HCC)   Bilateral lower extremity edema   Iron deficiency anemia following bariatric surgery   Hyperlipidemia    ASSESSMENT AND PLAN:  Spencer Herring is a 67 y.o. male  with a past medical history of coronary artery disease s/p PCI to LAD 10/2017 and PCI to OM 2 01/2018, angina, hypertension, hyperlipidemia, anemia, anxiety who presented to the ED on 01/01/2023 for shortness of breath, leg swelling.  Cardiology is for further evaluation and management of new onset atrial fibrillation and acute heart failure.   # Acute heart failure # Shortness of breath  # Bilateral lower extremity edema Patient with worsening shortness of breath and  lower extremity edema over the last week.  BNP in the ED 226.  Prior cardiac MRI 2021 with EF 62% -Echo shows newly reduced LVEF -S/p IV Lasix 20 mg x 1, IV Lasix 60 mg x1 -Will initiate Lasix 60 mg IV BID -Continue home losartan 25 mg daily -Start Farxiga 10 mg daily -Appreciate heart failure pharmacist recommendations  # New onset atrial fibrillation RVR  Patient presenting to the ED with shortness of breath found to be in new onset atrial fibrillation on EKG with rate in the 130s. -NO cardizem as LVEF is significantly reduced (visually observed by me while he was getting echo) -Anticoagulation with Eliquis  5 mg BID. For now, will place on heparin gtt until after cath on Monday  #Unstable angina  #Coronary artery disease s/p PCI to LAD and OM 2 in 2019 # Hyperlipidemia -Continue home atorvastatin 40 -Stop ASA since we have started Eliquis -Continue home Ranexa 500 mg twice daily -Plan for diagnostic left heart catheterization with possible intervention on Monday 01/05/2023.  Schedule for cardiac catheterization, and possible angioplasty. We discussed regarding risks, benefits, alternatives to this including stress testing, CTA and continued medical therapy. Patient wants to proceed. Understands <1-2% risk of death, stroke, MI, urgent CABG, bleeding, infection, renal failure but not limited to these.    Signed: Clotilde Dieter, DO 01/02/2023, 12:24 PM [facility]

## 2023-01-03 DIAGNOSIS — I5023 Acute on chronic systolic (congestive) heart failure: Secondary | ICD-10-CM | POA: Diagnosis not present

## 2023-01-03 LAB — BASIC METABOLIC PANEL
Anion gap: 11 (ref 5–15)
BUN: 31 mg/dL — ABNORMAL HIGH (ref 8–23)
CO2: 27 mmol/L (ref 22–32)
Calcium: 9.3 mg/dL (ref 8.9–10.3)
Chloride: 97 mmol/L — ABNORMAL LOW (ref 98–111)
Creatinine, Ser: 0.97 mg/dL (ref 0.61–1.24)
GFR, Estimated: >60 mL/min (ref >60)
Glucose, Bld: 140 mg/dL — ABNORMAL HIGH (ref 70–99)
Potassium: 3.9 mmol/L (ref 3.5–5.1)
Sodium: 135 mmol/L (ref 135–145)

## 2023-01-03 LAB — CBC
HCT: 39.7 % (ref 39.0–52.0)
Hemoglobin: 13.4 g/dL (ref 13.0–17.0)
MCH: 29.5 pg (ref 26.0–34.0)
MCHC: 33.8 g/dL (ref 30.0–36.0)
MCV: 87.3 fL (ref 80.0–100.0)
Platelets: 245 10*3/uL (ref 150–400)
RBC: 4.55 MIL/uL (ref 4.22–5.81)
RDW: 13.7 % (ref 11.5–15.5)
WBC: 6.3 10*3/uL (ref 4.0–10.5)
nRBC: 0 % (ref 0.0–0.2)

## 2023-01-03 LAB — APTT
aPTT: 122 s — ABNORMAL HIGH (ref 24–36)
aPTT: 83 s — ABNORMAL HIGH (ref 24–36)
aPTT: 73 s — ABNORMAL HIGH (ref 24–36)

## 2023-01-03 LAB — MAGNESIUM: Magnesium: 2.7 mg/dL — ABNORMAL HIGH (ref 1.7–2.4)

## 2023-01-03 LAB — HEPARIN LEVEL (UNFRACTIONATED): Heparin Unfractionated: >1.1 [IU]/mL — ABNORMAL HIGH (ref 0.30–0.70)

## 2023-01-03 NOTE — Progress Notes (Signed)
[facility] CLINIC CARDIOLOGY PROGRESS NOTE   Patient ID: Spencer Herring MRN: [id number] DOB/AGE: July 04, 1955 67 y.o.  Admit date: 01/01/2023 Referring Physician Dr Londell Moh Primary Physician Dr Marcello Fennel Primary Cardiologist Dr. Erskine Emery [facility])  Reason for Consultation new onset atrial fibrillation RVR, acute heart failure   HPI: Spencer Herring is a 67 y.o. male with a past medical history of coronary artery disease s/p PCI to LAD 10/2017 and PCI to OM 2 01/2018, angina, hypertension, hyperlipidemia, anemia, anxiety who presented to the ED on 01/01/2023 for shortness of breath, leg swelling.  Cardiology is for further evaluation and management of new onset atrial fibrillation and acute heart failure.   Interval History:  -Patient seen and examined at bedside, resting comfortably. No events overnight. Chest pain is stable.   Review of systems complete and found to be negative unless listed above    Vitals:   01/03/23 0826 01/03/23 1150 01/03/23 1155 01/03/23 1202  BP: 110/89 (!) 84/72 103/87 121/65  Pulse: 94 86 (!) 54 (!) 53  Resp: 17 16  17   Temp: 97.6 F (36.4 C) (!) 97.5 F (36.4 C)  98.3 F (36.8 C)  TempSrc: Oral     SpO2: 94% 94%  94%  Weight: 98.4 kg     Height:        No intake or output data in the 24 hours ending 01/03/23 1336   PHYSICAL EXAM General: awake and alert, well nourished, in no acute distress. HEENT: Normocephalic and atraumatic. Neck: No JVD.  Lungs: Normal respiratory effort. Clear bilaterally to auscultation with decreased breath sounds at BL bases. No wheezes, crackles, rhonchi.  Heart: Irregular rhythm, rate controlled. Normal S1 and S2 without gallops or murmurs. Radial & DP pulses 2+ bilaterally. Abdomen: Non-distended appearing.  Msk: Normal strength and tone for age. Extremities: No clubbing, cyanosis. + edema.   Neuro: Alert and oriented X 3. Psych: Mood appropriate, affect congruent.    LABS: Basic Metabolic Panel: Recent Labs     01/02/23 0428 01/03/23 0943  NA 138 135  K 3.9 3.9  CL 100 97*  CO2 29 27  GLUCOSE 88 140*  BUN 21 31*  CREATININE 0.76 0.97  CALCIUM 8.9 9.3  MG  --  2.7*   Liver Function Tests: No results for input(s): "AST", "ALT", "ALKPHOS", "BILITOT", "PROT", "ALBUMIN" in the last 72 hours. No results for input(s): "LIPASE", "AMYLASE" in the last 72 hours. CBC: Recent Labs    01/02/23 0428 01/03/23 0636  WBC 5.5 6.3  HGB 12.1* 13.4  HCT 36.9* 39.7  MCV 90.4 87.3  PLT 239 245   Cardiac Enzymes: Recent Labs    01/01/23 1228 01/01/23 1427  TROPONINIHS 9 10   BNP: Recent Labs    01/01/23 1228  BNP 226.2*   D-Dimer: No results for input(s): "DDIMER" in the last 72 hours. Hemoglobin A1C: No results for input(s): "HGBA1C" in the last 72 hours. Fasting Lipid Panel: No results for input(s): "CHOL", "HDL", "LDLCALC", "TRIG", "CHOLHDL", "LDLDIRECT" in the last 72 hours. Thyroid Function Tests: No results for input(s): "TSH", "T4TOTAL", "T3FREE", "THYROIDAB" in the last 72 hours.  Invalid input(s): "FREET3" Anemia Panel: No results for input(s): "VITAMINB12", "FOLATE", "FERRITIN", "TIBC", "IRON", "RETICCTPCT" in the last 72 hours.  ECHOCARDIOGRAM COMPLETE  Result Date: 01/02/2023    ECHOCARDIOGRAM REPORT   Patient Name:   Spencer Herring Date of Exam: 01/02/2023 Medical Rec #:  [id number]       Height:  64.0 in Accession #:    [id number]      Weight:       223.0 lb Date of Birth:  [birth year]       BSA:          2.049 m Patient Age:    66 years        BP:           126/88 mmHg Patient Gender: M               HR:           119 bpm. Exam Location:  [facility] Procedure: 2D Echo, Cardiac Doppler, Color Doppler and Intracardiac            Opacification Agent Indications:     CHF, Dyspnea  History:         Patient has no prior history of Echocardiogram examinations.                  CHF, CAD, Arrythmias:LBBB; Signs/Symptoms:Dyspnea.  Sonographer:     Mikki Harbor Referring Phys:  [id number]  AMY N COX Diagnosing Phys: Debbe Odea MD  Sonographer Comments: Patient is obese. IMPRESSIONS  1. Left ventricular ejection fraction, by estimation, is 20 to 25%. The left ventricle has severely decreased function. The left ventricle demonstrates global hypokinesis. There is mild left ventricular hypertrophy. Left ventricular diastolic parameters  are indeterminate.  2. Right ventricular systolic function is normal. The right ventricular size is mildly enlarged.  3. Left atrial size was moderately dilated.  4. The mitral valve is normal in structure. Mild to moderate mitral valve regurgitation.  5. The aortic valve was not well visualized. Aortic valve regurgitation is not visualized. Aortic valve sclerosis is present, with no evidence of aortic valve stenosis. FINDINGS  Left Ventricle: Left ventricular ejection fraction, by estimation, is 20 to 25%. The left ventricle has severely decreased function. The left ventricle demonstrates global hypokinesis. Definity contrast agent was given IV to delineate the left ventricular endocardial borders. The left ventricular internal cavity size was normal in size. There is mild left ventricular hypertrophy. Left ventricular diastolic parameters are indeterminate. Right Ventricle: The right ventricular size is mildly enlarged. No increase in right ventricular wall thickness. Right ventricular systolic function is normal. Left Atrium: Left atrial size was moderately dilated. Right Atrium: Right atrial size was normal in size. Pericardium: There is no evidence of pericardial effusion. Mitral Valve: The mitral valve is normal in structure. Mild to moderate mitral valve regurgitation. MV peak gradient, 4.5 mmHg. The mean mitral valve gradient is 2.0 mmHg. Tricuspid Valve: The tricuspid valve is normal in structure. Tricuspid valve regurgitation is mild. Aortic Valve: The aortic valve was not well visualized. Aortic valve regurgitation is not visualized. Aortic valve  sclerosis is present, with no evidence of aortic valve stenosis. Aortic valve mean gradient measures 4.0 mmHg. Aortic valve peak gradient measures 8.0 mmHg. Aortic valve area, by VTI measures 2.53 cm. Pulmonic Valve: The pulmonic valve was normal in structure. Pulmonic valve regurgitation is mild. Aorta: The aortic root is normal in size and structure. Venous: The inferior vena cava was not well visualized. IAS/Shunts: No atrial level shunt detected by color flow Doppler.  LEFT VENTRICLE PLAX 2D LVIDd:         5.30 cm LVIDs:         4.90 cm LV PW:         1.30 cm LV IVS:        1.20 cm LVOT  diam:     2.10 cm LV SV:         65 LV SV Index:   32 LVOT Area:     3.46 cm  RIGHT VENTRICLE RV Basal diam:  4.00 cm RV Mid diam:    3.30 cm RV S prime:     12.10 cm/s LEFT ATRIUM              Index        RIGHT ATRIUM           Index LA diam:        4.50 cm  2.20 cm/m   RA Area:     21.30 cm LA Vol (A2C):   114.0 ml 55.64 ml/m  RA Volume:   68.60 ml  33.48 ml/m LA Vol (A4C):   94.8 ml  46.27 ml/m LA Biplane Vol: 105.0 ml 51.24 ml/m  AORTIC VALVE                    PULMONIC VALVE AV Area (Vmax):    2.42 cm     PV Vmax:       1.13 m/s AV Area (Vmean):   2.43 cm     PV Peak grad:  5.1 mmHg AV Area (VTI):     2.53 cm AV Vmax:           141.50 cm/s AV Vmean:          87.500 cm/s AV VTI:            0.255 m AV Peak Grad:      8.0 mmHg AV Mean Grad:      4.0 mmHg LVOT Vmax:         98.80 cm/s LVOT Vmean:        61.450 cm/s LVOT VTI:          0.186 m LVOT/AV VTI ratio: 0.73  AORTA Ao Root diam: 3.70 cm Ao Asc diam:  3.50 cm MITRAL VALVE               TRICUSPID VALVE MV Area (PHT): 6.71 cm    TR Peak grad:   15.2 mmHg MV Area VTI:   3.51 cm    TR Vmax:        195.00 cm/s MV Peak grad:  4.5 mmHg MV Mean grad:  2.0 mmHg    SHUNTS MV Vmax:       1.06 m/s    Systemic VTI:  0.19 m MV Vmean:      61.7 cm/s   Systemic Diam: 2.10 cm MV Decel Time: 113 msec MV E velocity: 80.20 cm/s Debbe Odea MD Electronically signed by Debbe Odea MD Signature Date/Time: 01/02/2023/2:14:20 PM    Final    US Venous Img Lower Bilateral (DVT)  Result Date: 01/01/2023 CLINICAL DATA:  Leg swelling. EXAM: Bilateral LOWER EXTREMITY VENOUS DOPPLER ULTRASOUND TECHNIQUE: Gray-scale sonography with compression, as well as color and duplex ultrasound, were performed to evaluate the deep venous system(s) from the level of the common femoral vein through the popliteal and proximal calf veins. COMPARISON:  None Available. FINDINGS: VENOUS Normal compressibility of the common femoral, superficial femoral, and popliteal veins, as well as the visualized calf veins. Visualized portions of profunda femoral vein and great saphenous vein unremarkable. No filling defects to suggest DVT on grayscale or color Doppler imaging. Doppler waveforms show normal direction of venous flow, normal respiratory plasticity and response to augmentation. Limited views of the contralateral common  femoral vein are unremarkable. OTHER None. Limitations: none IMPRESSION: No evidence of bilateral lower extremity DVT Electronically Signed   By: Karen Kays M.D.   On: 01/01/2023 18:41   DG Chest Portable 1 View  Result Date: 01/01/2023 CLINICAL DATA:  Atrial fibrillation with shortness of breath. EXAM: PORTABLE CHEST 1 VIEW COMPARISON:  October 02, 2022. FINDINGS: The cardiac silhouette is mildly enlarged and unchanged in size. There is moderate severity calcification of the aortic arch. Mild, diffuse, chronic appearing increased interstitial lung markings are noted. There is mild, predominately perihilar prominence of the pulmonary vasculature. Mild atelectasis is seen within the bilateral lung bases. No pleural effusion or pneumothorax is identified. Multilevel degenerative changes are noted throughout the thoracic spine. IMPRESSION: 1. Mild cardiomegaly with mild pulmonary vascular congestion. 2. Mild bibasilar atelectasis. Electronically Signed   By: Aram Candela M.D.   On:  01/01/2023 15:15     ECHO newly reduced LVEF 20-25%  TELEMETRY reviewed by me 01/03/23: Afib CVR, rates in 90s   DATA reviewed by me 01/03/23:  last 24h vitals tele labs imaging I/O ED provider note, admission H&P    ASSESSMENT AND PLAN:  Principal Problem:   Acute exacerbation of CHF (congestive heart failure) (HCC) Active Problems:   CAD (coronary artery disease)   Morbid obesity (HCC)   Bilateral lower extremity edema   Iron deficiency anemia following bariatric surgery   Hyperlipidemia    Spencer Herring is a 67 y.o. male  with a past medical history of coronary artery disease s/p PCI to LAD 10/2017 and PCI to OM 2 01/2018, angina, hypertension, hyperlipidemia, anemia, anxiety who presented to the ED on 01/01/2023 for shortness of breath, leg swelling.  Cardiology is for further evaluation and management of new onset atrial fibrillation and acute heart failure.    # Acute heart failure # Shortness of breath  # Bilateral lower extremity edema Patient with worsening shortness of breath and lower extremity edema over the last week.  BNP in the ED 226.  Prior cardiac MRI 2021 with EF 62% -Echo shows newly reduced LVEF 20-25% -S/p IV Lasix 20 mg x 1, IV Lasix 60 mg x1 -Will initiate Lasix 60 mg IV BID -Continue home losartan 25 mg daily -Start Farxiga 10 mg daily -Appreciate heart failure pharmacist recommendations   # New onset atrial fibrillation RVR  Patient presenting to the ED with shortness of breath found to be in new onset atrial fibrillation on EKG with rate in the 130s. -NO cardizem as LVEF is significantly reduced (visually observed by me while he was getting echo) -Anticoagulation with Eliquis 5 mg BID. For now, will place on heparin gtt until after cath on Monday   #Unstable angina  #Coronary artery disease s/p PCI to LAD and OM 2 in 2019 # Hyperlipidemia -Continue home atorvastatin 40 -Continue ASA and heparin gtt -Continue home Ranexa 500 mg twice daily -Plan  for diagnostic left heart catheterization with possible intervention on Monday 01/05/2023.  Schedule for cardiac catheterization, and possible angioplasty. We discussed regarding risks, benefits, alternatives to this including stress testing, CTA and continued medical therapy. Patient wants to proceed. Understands <1-2% risk of death, stroke, MI, urgent CABG, bleeding, infection, renal failure but not limited to these.       Clotilde Dieter, DO  01/03/2023, 1:36 PM [facility]

## 2023-01-03 NOTE — Consult Note (Signed)
ANTICOAGULATION CONSULT NOTE  Pharmacy Consult for IV Heparin Indication: chest pain/ACS and atrial fibrillation  Patient Measurements: Height: 5\' 4"  (162.6 cm) Weight: 101.2 kg (223 lb) IBW/kg (Calculated) : 59.2 Heparin Dosing Weight: 82.1 kg  Labs: Recent Labs    01/01/23 1228 01/01/23 1427 01/02/23 0428 01/02/23 1331 01/03/23 0636  HGB 12.3*  --  12.1*  --  13.4  HCT 38.4*  --  36.9*  --  39.7  PLT 264  --  239  --  245  APTT  --   --   --  36 122*  LABPROT  --   --   --  14.3  --   INR  --   --   --  1.1  --   HEPARINUNFRC  --   --   --   --  >1.10*  CREATININE 0.94  --  0.76  --   --   TROPONINIHS 9 10  --   --   --     Estimated Creatinine Clearance: 100.3 mL/min (by C-G formula based on SCr of 0.76 mg/dL).   Medical History: Past Medical History:  Diagnosis Date   Anemia    Asthma    COPD (chronic obstructive pulmonary disease) (HCC)    Morbid obesity (HCC)    Neuromuscular disorder (HCC)    Sleep apnea     Medications:  New start apixaban 5 mg BID inpatient for Afib ordered and given x 1 dose, 01/02/23 at 1146  Assessment: 67 y/o M with medical history as above and including CAD admitted with new-onset Afib and acute HF. Pharmacy consulted to initiate and manage heparin infusion.  Baseline aPTT and PT-INR are pending. Will anticipate elevated baseline HL given recent administration of apixaban but likely fast washout.  9/7 0636 aPTT 122 HL > 1.1  Goal of Therapy:  Heparin level 0.3-0.7 units/ml once aPTT and heparin level correlate.  aPTT 66 - 102 seconds Monitor platelets by anticoagulation protocol: Yes   Plan:  aPTT is supratherapeutic. Will decrease the heparin infusion to 1250 units/hr. Recheck aPTT in 6 hours. Heparin level and CBC with AM labs.   Ronnald Ramp, PharmD, BCPS 01/03/2023,7:20 AM

## 2023-01-03 NOTE — Progress Notes (Signed)
PROGRESS NOTE    Spencer Herring  ZOX:[id number] DOB: January 10, 1956 DOA: 01/01/2023 PCP: Barbette Reichmann, MD    Brief Narrative:  67 year old male with history of CAD status post PCI, hypertension, hyperlipidemia, anxiety, angina, who presents to the emergency department for chief concerns of shortness of breath and fatigue with bilateral leg swelling.He reports shortness of breath and chest pain worse with exertion. He denies current chest pain and shortness of breath currently at bedside. He endorses fatigue. These symptoms improve with rest.  He denies trauma to his person, including chest area. He endorses compliance with home medications.  He endorses associated bilateral lower extremity edema.  He endorses feelings of fever, with tmax of 100 at home, chills, and nausea. He did not take anything at home for these symptoms. He denies vomiting.   9/6: Seen in consultation by cardiology.  Recommendations appreciated.  Left ventricular ejection fraction markedly reduced on direct visualization by cardiologist.  Patient started on Lasix 60 mg IV twice daily.  Cardizem discontinued.  Contraindicated the setting of reduced ejection fraction.       Assessment & Plan:   Principal Problem:   Acute exacerbation of CHF (congestive heart failure) (HCC) Active Problems:   CAD (coronary artery disease)   Morbid obesity (HCC)   Bilateral lower extremity edema   Iron deficiency anemia following bariatric surgery   Hyperlipidemia  Acute exacerbation of systolic congestive heart failure Left-ventricular ejection fraction markedly reduced on direct visualization by cardiologist.  Formal evaluation and read is pending.  Received 20 mg IV Lasix in ED. Plan: Continue Lasix 60 mg IV twice daily Farxiga 10 mg daily Strict ins and outs Daily weights Left heart catheterization with possible intervention Monday 9/9  New onset atrial fibrillation with rapid ventricular response Patient started on  Cardizem gtt.  Now discontinued and will avoid diltiazem given reduced ejection fraction. Plan: Continue Coreg 25 mg p.o. twice daily Heparin GTT for anticoagulation while awaiting cath Transition to p.o. Eliquis prior to discharge Telemetry monitor  Hyperlipidemia Statin   Iron deficiency anemia following bariatric surgery Home iron replacement   Morbid obesity (HCC) This meets criteria for morbid obesity based on the presence of 1 or more chronic comorbidities. Patient has BMI of 38.28 and CAD. This complicates overall care and prognosis.    CAD (coronary artery disease) Aspirin 81 mg daily,  atorvastatin 40 mg nightly,  losartan 25 mg daily Ranexa 500 mg p.o. twice daily resumed   DVT prophylaxis: IV heparin Code Status: Full Family Communication: None today Disposition Plan: Status is: Inpatient Remains inpatient appropriate because: Exacerbation of heart failure.  Rapid atrial fibrillation   Level of care: Telemetry Cardiac  Consultants:  Cardiology  Procedures:  None  Antimicrobials: None   Subjective: Seen and examined.  Sitting in chair.  No visible distress  Objective: Vitals:   01/03/23 0826 01/03/23 1150 01/03/23 1155 01/03/23 1202  BP: 110/89 (!) 84/72 103/87 121/65  Pulse: 94 86 (!) 54 (!) 53  Resp: 17 16  17   Temp: 97.6 F (36.4 C) (!) 97.5 F (36.4 C)  98.3 F (36.8 C)  TempSrc: Oral     SpO2: 94% 94%  94%  Weight: 98.4 kg     Height:       No intake or output data in the 24 hours ending 01/03/23 1350 Filed Weights   01/01/23 1225 01/03/23 0826  Weight: 101.2 kg 98.4 kg    Examination:  General exam: No acute distress Respiratory system: Bibasilar crackles.  Normal work of breathing.  2 L Cardiovascular system: Tach cardia, regular rhythm, no murmurs, 2+ pitting edema BLE Gastrointestinal system: Obese, soft, NT/ND, normal bowel sounds Central nervous system: Alert and oriented. No focal neurological deficits. Extremities:  Symmetric 5 x 5 power. Skin: No rashes, lesions or ulcers Psychiatry: Judgement and insight appear normal. Mood & affect appropriate.     Data Reviewed: I have personally reviewed following labs and imaging studies  CBC: Recent Labs  Lab 01/01/23 1228 01/02/23 0428 01/03/23 0636  WBC 7.7 5.5 6.3  HGB 12.3* 12.1* 13.4  HCT 38.4* 36.9* 39.7  MCV 91.4 90.4 87.3  PLT 264 239 245   Basic Metabolic Panel: Recent Labs  Lab 01/01/23 1228 01/02/23 0428 01/03/23 0943  NA 138 138 135  K 4.3 3.9 3.9  CL 100 100 97*  CO2 27 29 27   GLUCOSE 98 88 140*  BUN 22 21 31*  CREATININE 0.94 0.76 0.97  CALCIUM 9.2 8.9 9.3  MG  --   --  2.7*   GFR: Estimated Creatinine Clearance: 81.5 mL/min (by C-G formula based on SCr of 0.97 mg/dL). Liver Function Tests: No results for input(s): "AST", "ALT", "ALKPHOS", "BILITOT", "PROT", "ALBUMIN" in the last 168 hours. No results for input(s): "LIPASE", "AMYLASE" in the last 168 hours. No results for input(s): "AMMONIA" in the last 168 hours. Coagulation Profile: Recent Labs  Lab 01/02/23 1331  INR 1.1   Cardiac Enzymes: No results for input(s): "CKTOTAL", "CKMB", "CKMBINDEX", "TROPONINI" in the last 168 hours. BNP (last 3 results) No results for input(s): "PROBNP" in the last 8760 hours. HbA1C: No results for input(s): "HGBA1C" in the last 72 hours. CBG: No results for input(s): "GLUCAP" in the last 168 hours. Lipid Profile: No results for input(s): "CHOL", "HDL", "LDLCALC", "TRIG", "CHOLHDL", "LDLDIRECT" in the last 72 hours. Thyroid Function Tests: No results for input(s): "TSH", "T4TOTAL", "FREET4", "T3FREE", "THYROIDAB" in the last 72 hours. Anemia Panel: No results for input(s): "VITAMINB12", "FOLATE", "FERRITIN", "TIBC", "IRON", "RETICCTPCT" in the last 72 hours. Sepsis Labs: No results for input(s): "PROCALCITON", "LATICACIDVEN" in the last 168 hours.  Recent Results (from the past 240 hour(s))  Resp panel by RT-PCR (RSV, Flu  A&B, Covid) Anterior Nasal Swab     Status: None   Collection Time: 01/01/23  1:04 PM   Specimen: Anterior Nasal Swab  Result Value Ref Range Status   SARS Coronavirus 2 by RT PCR NEGATIVE NEGATIVE Final    Comment: (NOTE) SARS-CoV-2 target nucleic acids are NOT DETECTED.  The SARS-CoV-2 RNA is generally detectable in upper respiratory specimens during the acute phase of infection. The lowest concentration of SARS-CoV-2 viral copies this assay can detect is 138 copies/mL. A negative result does not preclude SARS-Cov-2 infection and should not be used as the sole basis for treatment or other patient management decisions. A negative result may occur with  improper specimen collection/handling, submission of specimen other than nasopharyngeal swab, presence of viral mutation(s) within the areas targeted by this assay, and inadequate number of viral copies(<138 copies/mL). A negative result must be combined with clinical observations, patient history, and epidemiological information. The expected result is Negative.  Fact Sheet for Patients:  BloggerCourse.com  Fact Sheet for Healthcare Providers:  SeriousBroker.it  This test is no t yet approved or cleared by the [place] FDA and  has been authorized for detection and/or diagnosis of SARS-CoV-2 by FDA under an Emergency Use Authorization (EUA). This EUA will remain  in effect (meaning this test can be  used) for the duration of the COVID-19 declaration under Section 564(b)(1) of the Act, 21 U.S.C.section 360bbb-3(b)(1), unless the authorization is terminated  or revoked sooner.       Influenza A by PCR NEGATIVE NEGATIVE Final   Influenza B by PCR NEGATIVE NEGATIVE Final    Comment: (NOTE) The Xpert Xpress SARS-CoV-2/FLU/RSV plus assay is intended as an aid in the diagnosis of influenza from Nasopharyngeal swab specimens and should not be used as a sole basis for treatment.  Nasal washings and aspirates are unacceptable for Xpert Xpress SARS-CoV-2/FLU/RSV testing.  Fact Sheet for Patients: BloggerCourse.com  Fact Sheet for Healthcare Providers: SeriousBroker.it  This test is not yet approved or cleared by the [place] FDA and has been authorized for detection and/or diagnosis of SARS-CoV-2 by FDA under an Emergency Use Authorization (EUA). This EUA will remain in effect (meaning this test can be used) for the duration of the COVID-19 declaration under Section 564(b)(1) of the Act, 21 U.S.C. section 360bbb-3(b)(1), unless the authorization is terminated or revoked.     Resp Syncytial Virus by PCR NEGATIVE NEGATIVE Final    Comment: (NOTE) Fact Sheet for Patients: BloggerCourse.com  Fact Sheet for Healthcare Providers: SeriousBroker.it  This test is not yet approved or cleared by the [place] FDA and has been authorized for detection and/or diagnosis of SARS-CoV-2 by FDA under an Emergency Use Authorization (EUA). This EUA will remain in effect (meaning this test can be used) for the duration of the COVID-19 declaration under Section 564(b)(1) of the Act, 21 U.S.C. section 360bbb-3(b)(1), unless the authorization is terminated or revoked.  Performed at [facility], [address], [place], [place] [address]          Radiology Studies: ECHOCARDIOGRAM COMPLETE  Result Date: 01/02/2023    ECHOCARDIOGRAM REPORT   Patient Name:   Spencer Herring Date of Exam: 01/02/2023 Medical Rec #:  [id number]       Height:       64.0 in Accession #:    [id number]      Weight:       223.0 lb Date of Birth:  1955-09-16       BSA:          2.049 m Patient Age:    66 years        BP:           126/88 mmHg Patient Gender: M               HR:           119 bpm. Exam Location:  [facility] Procedure: 2D Echo, Cardiac Doppler, Color Doppler and Intracardiac             Opacification Agent Indications:     CHF, Dyspnea  History:         Patient has no prior history of Echocardiogram examinations.                  CHF, CAD, Arrythmias:LBBB; Signs/Symptoms:Dyspnea.  Sonographer:     Mikki Harbor Referring Phys:  [id number] AMY N COX Diagnosing Phys: Debbe Odea MD  Sonographer Comments: Patient is obese. IMPRESSIONS  1. Left ventricular ejection fraction, by estimation, is 20 to 25%. The left ventricle has severely decreased function. The left ventricle demonstrates global hypokinesis. There is mild left ventricular hypertrophy. Left ventricular diastolic parameters  are indeterminate.  2. Right ventricular systolic function is normal. The right ventricular size is mildly enlarged.  3. Left atrial size  was moderately dilated.  4. The mitral valve is normal in structure. Mild to moderate mitral valve regurgitation.  5. The aortic valve was not well visualized. Aortic valve regurgitation is not visualized. Aortic valve sclerosis is present, with no evidence of aortic valve stenosis. FINDINGS  Left Ventricle: Left ventricular ejection fraction, by estimation, is 20 to 25%. The left ventricle has severely decreased function. The left ventricle demonstrates global hypokinesis. Definity contrast agent was given IV to delineate the left ventricular endocardial borders. The left ventricular internal cavity size was normal in size. There is mild left ventricular hypertrophy. Left ventricular diastolic parameters are indeterminate. Right Ventricle: The right ventricular size is mildly enlarged. No increase in right ventricular wall thickness. Right ventricular systolic function is normal. Left Atrium: Left atrial size was moderately dilated. Right Atrium: Right atrial size was normal in size. Pericardium: There is no evidence of pericardial effusion. Mitral Valve: The mitral valve is normal in structure. Mild to moderate mitral valve regurgitation. MV peak gradient, 4.5 mmHg. The  mean mitral valve gradient is 2.0 mmHg. Tricuspid Valve: The tricuspid valve is normal in structure. Tricuspid valve regurgitation is mild. Aortic Valve: The aortic valve was not well visualized. Aortic valve regurgitation is not visualized. Aortic valve sclerosis is present, with no evidence of aortic valve stenosis. Aortic valve mean gradient measures 4.0 mmHg. Aortic valve peak gradient measures 8.0 mmHg. Aortic valve area, by VTI measures 2.53 cm. Pulmonic Valve: The pulmonic valve was normal in structure. Pulmonic valve regurgitation is mild. Aorta: The aortic root is normal in size and structure. Venous: The inferior vena cava was not well visualized. IAS/Shunts: No atrial level shunt detected by color flow Doppler.  LEFT VENTRICLE PLAX 2D LVIDd:         5.30 cm LVIDs:         4.90 cm LV PW:         1.30 cm LV IVS:        1.20 cm LVOT diam:     2.10 cm LV SV:         65 LV SV Index:   32 LVOT Area:     3.46 cm  RIGHT VENTRICLE RV Basal diam:  4.00 cm RV Mid diam:    3.30 cm RV S prime:     12.10 cm/s LEFT ATRIUM              Index        RIGHT ATRIUM           Index LA diam:        4.50 cm  2.20 cm/m   RA Area:     21.30 cm LA Vol (A2C):   114.0 ml 55.64 ml/m  RA Volume:   68.60 ml  33.48 ml/m LA Vol (A4C):   94.8 ml  46.27 ml/m LA Biplane Vol: 105.0 ml 51.24 ml/m  AORTIC VALVE                    PULMONIC VALVE AV Area (Vmax):    2.42 cm     PV Vmax:       1.13 m/s AV Area (Vmean):   2.43 cm     PV Peak grad:  5.1 mmHg AV Area (VTI):     2.53 cm AV Vmax:           141.50 cm/s AV Vmean:          87.500 cm/s AV VTI:  0.255 m AV Peak Grad:      8.0 mmHg AV Mean Grad:      4.0 mmHg LVOT Vmax:         98.80 cm/s LVOT Vmean:        61.450 cm/s LVOT VTI:          0.186 m LVOT/AV VTI ratio: 0.73  AORTA Ao Root diam: 3.70 cm Ao Asc diam:  3.50 cm MITRAL VALVE               TRICUSPID VALVE MV Area (PHT): 6.71 cm    TR Peak grad:   15.2 mmHg MV Area VTI:   3.51 cm    TR Vmax:        195.00 cm/s MV  Peak grad:  4.5 mmHg MV Mean grad:  2.0 mmHg    SHUNTS MV Vmax:       1.06 m/s    Systemic VTI:  0.19 m MV Vmean:      61.7 cm/s   Systemic Diam: 2.10 cm MV Decel Time: 113 msec MV E velocity: 80.20 cm/s Debbe Odea MD Electronically signed by Debbe Odea MD Signature Date/Time: 01/02/2023/2:14:20 PM    Final    US Venous Img Lower Bilateral (DVT)  Result Date: 01/01/2023 CLINICAL DATA:  Leg swelling. EXAM: Bilateral LOWER EXTREMITY VENOUS DOPPLER ULTRASOUND TECHNIQUE: Gray-scale sonography with compression, as well as color and duplex ultrasound, were performed to evaluate the deep venous system(s) from the level of the common femoral vein through the popliteal and proximal calf veins. COMPARISON:  None Available. FINDINGS: VENOUS Normal compressibility of the common femoral, superficial femoral, and popliteal veins, as well as the visualized calf veins. Visualized portions of profunda femoral vein and great saphenous vein unremarkable. No filling defects to suggest DVT on grayscale or color Doppler imaging. Doppler waveforms show normal direction of venous flow, normal respiratory plasticity and response to augmentation. Limited views of the contralateral common femoral vein are unremarkable. OTHER None. Limitations: none IMPRESSION: No evidence of bilateral lower extremity DVT Electronically Signed   By: Karen Kays M.D.   On: 01/01/2023 18:41   DG Chest Portable 1 View  Result Date: 01/01/2023 CLINICAL DATA:  Atrial fibrillation with shortness of breath. EXAM: PORTABLE CHEST 1 VIEW COMPARISON:  October 02, 2022. FINDINGS: The cardiac silhouette is mildly enlarged and unchanged in size. There is moderate severity calcification of the aortic arch. Mild, diffuse, chronic appearing increased interstitial lung markings are noted. There is mild, predominately perihilar prominence of the pulmonary vasculature. Mild atelectasis is seen within the bilateral lung bases. No pleural effusion or pneumothorax is  identified. Multilevel degenerative changes are noted throughout the thoracic spine. IMPRESSION: 1. Mild cardiomegaly with mild pulmonary vascular congestion. 2. Mild bibasilar atelectasis. Electronically Signed   By: Aram Candela M.D.   On: 01/01/2023 15:15        Scheduled Meds:  ascorbic acid  250 mg Oral Daily   aspirin EC  81 mg Oral Daily   atorvastatin  40 mg Oral QHS   carvedilol  25 mg Oral BID WC   dapagliflozin propanediol  10 mg Oral Daily   ferrous sulfate  325 mg Oral BID WC   furosemide  60 mg Intravenous Q12H   gabapentin  300 mg Oral QHS   influenza vac split trivalent PF  0.5 mL Intramuscular Tomorrow-1000   losartan  25 mg Oral Daily   multivitamin with minerals  1 tablet Oral Daily   ranolazine  500  mg Oral BID   Continuous Infusions:  heparin 1,250 Units/hr (01/03/23 0748)     LOS: 2 days     Tresa Moore, MD [facility]   If 7PM-7AM, please contact night-coverage  01/03/2023, 1:50 PM

## 2023-01-03 NOTE — Consult Note (Signed)
ANTICOAGULATION CONSULT NOTE  Pharmacy Consult for IV Heparin Indication: chest pain/ACS and atrial fibrillation  Patient Measurements: Height: 5\' 4"  (162.6 cm) Weight: 98.4 kg (216 lb 14.4 oz) IBW/kg (Calculated) : 59.2 Heparin Dosing Weight: 82.1 kg  Labs: Recent Labs    01/01/23 1228 01/01/23 1427 01/02/23 0428 01/02/23 1331 01/03/23 0636 01/03/23 0943 01/03/23 1336  HGB 12.3*  --  12.1*  --  13.4  --   --   HCT 38.4*  --  36.9*  --  39.7  --   --   PLT 264  --  239  --  245  --   --   APTT  --   --   --  36 122*  --  83*  LABPROT  --   --   --  14.3  --   --   --   INR  --   --   --  1.1  --   --   --   HEPARINUNFRC  --   --   --   --  >1.10*  --   --   CREATININE 0.94  --  0.76  --   --  0.97  --   TROPONINIHS 9 10  --   --   --   --   --     Estimated Creatinine Clearance: 81.5 mL/min (by C-G formula based on SCr of 0.97 mg/dL).   Medical History: Past Medical History:  Diagnosis Date   Anemia    Asthma    COPD (chronic obstructive pulmonary disease) (HCC)    Morbid obesity (HCC)    Neuromuscular disorder (HCC)    Sleep apnea     Medications:  New start apixaban 5 mg BID inpatient for Afib ordered and given x 1 dose, 01/02/23 at 1146  Assessment: 67 y/o M with medical history as above and including CAD admitted with new-onset Afib and acute HF. Pharmacy consulted to initiate and manage heparin infusion.  Baseline aPTT and PT-INR are pending. Will anticipate elevated baseline HL given recent administration of apixaban but likely fast washout.  9/7 0636 aPTT 122 HL > 1.1 9/7 1336 aPTT 83    Goal of Therapy:  Heparin level 0.3-0.7 units/ml once aPTT and heparin level correlate.  aPTT 66 - 102 seconds Monitor platelets by anticoagulation protocol: Yes   Plan:  aPTT is therapeutic x1. Continue heparin infusion to 1250 units/hr. Recheck confirmatory aPTT in 6 hours. Heparin level and CBC with AM labs.   Effie Shy PGY1 Pharmacy  Resident 01/03/2023,3:09 PM

## 2023-01-03 NOTE — Consult Note (Signed)
ANTICOAGULATION CONSULT NOTE  Pharmacy Consult for IV Heparin Indication: chest pain/ACS and atrial fibrillation  Patient Measurements: Height: 5\' 4"  (162.6 cm) Weight: 98.4 kg (216 lb 14.4 oz) IBW/kg (Calculated) : 59.2 Heparin Dosing Weight: 82.1 kg  Labs: Recent Labs    01/01/23 1228 01/01/23 1427 01/02/23 0428 01/02/23 1331 01/02/23 1331 01/03/23 0636 01/03/23 0943 01/03/23 1336 01/03/23 1930  HGB 12.3*  --  12.1*  --   --  13.4  --   --   --   HCT 38.4*  --  36.9*  --   --  39.7  --   --   --   PLT 264  --  239  --   --  245  --   --   --   APTT  --   --   --  36   < > 122*  --  83* 73*  LABPROT  --   --   --  14.3  --   --   --   --   --   INR  --   --   --  1.1  --   --   --   --   --   HEPARINUNFRC  --   --   --   --   --  >1.10*  --   --   --   CREATININE 0.94  --  0.76  --   --   --  0.97  --   --   TROPONINIHS 9 10  --   --   --   --   --   --   --    < > = values in this interval not displayed.    Estimated Creatinine Clearance: 81.5 mL/min (by C-G formula based on SCr of 0.97 mg/dL).   Medical History: Past Medical History:  Diagnosis Date   Anemia    Asthma    COPD (chronic obstructive pulmonary disease) (HCC)    Morbid obesity (HCC)    Neuromuscular disorder (HCC)    Sleep apnea     Medications:  New start apixaban 5 mg BID inpatient for Afib ordered and given x 1 dose, 01/02/23 at 1146  Assessment: 67 y/o M with medical history as above and including CAD admitted with new-onset Afib and acute HF. Pharmacy consulted to initiate and manage heparin infusion.  Baseline aPTT and PT-INR are pending. Will anticipate elevated baseline HL given recent administration of apixaban but likely fast washout.  9/7 0636 aPTT 122 HL > 1.1 9/7 1336 aPTT 83   9/7 1930 aPTT 73  Goal of Therapy:  Heparin level 0.3-0.7 units/ml once aPTT and heparin level correlate.  aPTT 66 - 102 seconds Monitor platelets by anticoagulation protocol: Yes   Plan:  aPTT is  therapeutic x 2. Continue heparin infusion at 1250 units/hr. Next aPTT with AM labs. Heparin level and CBC with AM labs.   Clovia Cuff, PharmD, BCPS 01/03/2023 8:13 PM

## 2023-01-03 NOTE — Plan of Care (Signed)
  Problem: Education: Goal: Ability to demonstrate management of disease process will improve Outcome: Progressing Goal: Ability to verbalize understanding of medication therapies will improve Outcome: Progressing Goal: Individualized Educational Video(s) Outcome: Progressing   Problem: Activity: Goal: Capacity to carry out activities will improve Outcome: Progressing   Problem: Cardiac: Goal: Ability to achieve and maintain adequate cardiopulmonary perfusion will improve Outcome: Progressing   Problem: Education: Goal: Understanding of CV disease, CV risk reduction, and recovery process will improve Outcome: Progressing Goal: Individualized Educational Video(s) Outcome: Progressing   Problem: Activity: Goal: Ability to return to baseline activity level will improve Outcome: Progressing   Problem: Cardiovascular: Goal: Ability to achieve and maintain adequate cardiovascular perfusion will improve Outcome: Progressing Goal: Vascular access site(s) Level 0-1 will be maintained Outcome: Progressing   Problem: Health Behavior/Discharge Planning: Goal: Ability to safely manage health-related needs after discharge will improve Outcome: Progressing   Problem: Education: Goal: Knowledge of General Education information will improve Description: Including pain rating scale, medication(s)/side effects and non-pharmacologic comfort measures Outcome: Progressing   Problem: Health Behavior/Discharge Planning: Goal: Ability to manage health-related needs will improve Outcome: Progressing   Problem: Clinical Measurements: Goal: Ability to maintain clinical measurements within normal limits will improve Outcome: Progressing Goal: Will remain free from infection Outcome: Progressing Goal: Diagnostic test results will improve Outcome: Progressing Goal: Respiratory complications will improve Outcome: Progressing Goal: Cardiovascular complication will be avoided Outcome:  Progressing   Problem: Activity: Goal: Risk for activity intolerance will decrease Outcome: Progressing   Problem: Nutrition: Goal: Adequate nutrition will be maintained Outcome: Progressing   Problem: Coping: Goal: Level of anxiety will decrease Outcome: Progressing   Problem: Elimination: Goal: Will not experience complications related to bowel motility Outcome: Progressing Goal: Will not experience complications related to urinary retention Outcome: Progressing   Problem: Pain Managment: Goal: General experience of comfort will improve Outcome: Progressing   Problem: Safety: Goal: Ability to remain free from injury will improve Outcome: Progressing   Problem: Skin Integrity: Goal: Risk for impaired skin integrity will decrease Outcome: Progressing   

## 2023-01-04 DIAGNOSIS — I5023 Acute on chronic systolic (congestive) heart failure: Secondary | ICD-10-CM | POA: Diagnosis not present

## 2023-01-04 LAB — MAGNESIUM: Magnesium: 2.6 mg/dL — ABNORMAL HIGH (ref 1.7–2.4)

## 2023-01-04 LAB — CBC
HCT: 37.7 % — ABNORMAL LOW (ref 39.0–52.0)
Hemoglobin: 12.7 g/dL — ABNORMAL LOW (ref 13.0–17.0)
MCH: 29.4 pg (ref 26.0–34.0)
MCHC: 33.7 g/dL (ref 30.0–36.0)
MCV: 87.3 fL (ref 80.0–100.0)
Platelets: 234 10*3/uL (ref 150–400)
RBC: 4.32 MIL/uL (ref 4.22–5.81)
RDW: 13.7 % (ref 11.5–15.5)
WBC: 6.2 10*3/uL (ref 4.0–10.5)
nRBC: 0 % (ref 0.0–0.2)

## 2023-01-04 LAB — BASIC METABOLIC PANEL
Anion gap: 6 (ref 5–15)
BUN: 29 mg/dL — ABNORMAL HIGH (ref 8–23)
CO2: 29 mmol/L (ref 22–32)
Calcium: 8.9 mg/dL (ref 8.9–10.3)
Chloride: 100 mmol/L (ref 98–111)
Creatinine, Ser: 0.81 mg/dL (ref 0.61–1.24)
GFR, Estimated: >60 mL/min (ref >60)
Glucose, Bld: 90 mg/dL (ref 70–99)
Potassium: 3.7 mmol/L (ref 3.5–5.1)
Sodium: 135 mmol/L (ref 135–145)

## 2023-01-04 LAB — HEPARIN LEVEL (UNFRACTIONATED): Heparin Unfractionated: 1.02 [IU]/mL — ABNORMAL HIGH (ref 0.30–0.70)

## 2023-01-04 LAB — APTT: aPTT: 88 s — ABNORMAL HIGH (ref 24–36)

## 2023-01-04 MED ORDER — CALCIUM CARBONATE ANTACID 500 MG PO CHEW
1.0000 | CHEWABLE_TABLET | Freq: Every day | ORAL | Status: DC | PRN
  Administered 2023-01-04 – 2023-01-05 (×2): 200 mg via ORAL
  Filled 2023-01-04 (×2): qty 1

## 2023-01-04 NOTE — Progress Notes (Signed)
[facility] CARDIOLOGY PROGRESS NOTE   Patient ID: Spencer Herring MRN: [id number] DOB/AGE: 30-Oct-1955 67 y.o.  Admit date: 01/01/2023 Referring Physician Dr Londell Moh Primary Physician Dr Marcello Fennel Primary Cardiologist Dr. Erskine Emery [facility])  Reason for Consultation new onset atrial fibrillation RVR, acute heart failure   HPI: Spencer Herring is a 67 y.o. male with a past medical history of coronary artery disease s/p PCI to LAD 10/2017 and PCI to OM 2 01/2018, angina, hypertension, hyperlipidemia, anemia, anxiety who presented to the ED on 01/01/2023 for shortness of breath, leg swelling.  Cardiology is for further evaluation and management of new onset atrial fibrillation and acute heart failure.   Interval History:  -Patient seen and examined at bedside, resting comfortably. No events overnight. Chest pain is stable. Edema has significantly improved since admission. Rates are stable.  Review of systems complete and found to be negative unless listed above    Vitals:   01/04/23 0030 01/04/23 0416 01/04/23 1006 01/04/23 1306  BP: 116/79 107/78 107/85 118/83  Pulse: 70 86 89 89  Resp: 18 16 16 18   Temp: 97.9 F (36.6 C) 97.9 F (36.6 C) 98.2 F (36.8 C) 97.7 F (36.5 C)  TempSrc:      SpO2: 97% 97% 97% 97%  Weight:      Height:         Intake/Output Summary (Last 24 hours) at 01/04/2023 1340 Last data filed at 01/04/2023 0600 Gross per 24 hour  Intake 1045.58 ml  Output 1800 ml  Net -754.42 ml     PHYSICAL EXAM General: awake and alert, well nourished, in no acute distress. HEENT: Normocephalic and atraumatic. Neck: No JVD.  Lungs: Normal respiratory effort. Clear bilaterally to auscultation with decreased breath sounds at BL bases. No wheezes, crackles, rhonchi.  Heart: Irregular rhythm, rate controlled. Normal S1 and S2 without gallops or murmurs. Radial & DP pulses 2+ bilaterally. Abdomen: Non-distended appearing.  Msk: Normal strength and tone for  age. Extremities: No clubbing, cyanosis. + edema.   Neuro: Alert and oriented X 3. Psych: Mood appropriate, affect congruent.    LABS: Basic Metabolic Panel: Recent Labs    01/03/23 0943 01/04/23 0411  NA 135 135  K 3.9 3.7  CL 97* 100  CO2 27 29  GLUCOSE 140* 90  BUN 31* 29*  CREATININE 0.97 0.81  CALCIUM 9.3 8.9  MG 2.7* 2.6*   Liver Function Tests: No results for input(s): "AST", "ALT", "ALKPHOS", "BILITOT", "PROT", "ALBUMIN" in the last 72 hours. No results for input(s): "LIPASE", "AMYLASE" in the last 72 hours. CBC: Recent Labs    01/03/23 0636 01/04/23 0413  WBC 6.3 6.2  HGB 13.4 12.7*  HCT 39.7 37.7*  MCV 87.3 87.3  PLT 245 234   Cardiac Enzymes: Recent Labs    01/01/23 1427  TROPONINIHS 10   BNP: No results for input(s): "BNP" in the last 72 hours.  D-Dimer: No results for input(s): "DDIMER" in the last 72 hours. Hemoglobin A1C: No results for input(s): "HGBA1C" in the last 72 hours. Fasting Lipid Panel: No results for input(s): "CHOL", "HDL", "LDLCALC", "TRIG", "CHOLHDL", "LDLDIRECT" in the last 72 hours. Thyroid Function Tests: No results for input(s): "TSH", "T4TOTAL", "T3FREE", "THYROIDAB" in the last 72 hours.  Invalid input(s): "FREET3" Anemia Panel: No results for input(s): "VITAMINB12", "FOLATE", "FERRITIN", "TIBC", "IRON", "RETICCTPCT" in the last 72 hours.  No results found.   ECHO newly reduced LVEF 20-25%  TELEMETRY reviewed by me 01/04/23: Afib CVR, rates in  90s   DATA reviewed by me 01/04/23:  last 24h vitals tele labs imaging I/O ED provider note, admission H&P    ASSESSMENT AND PLAN:  Principal Problem:   Acute exacerbation of CHF (congestive heart failure) (HCC) Active Problems:   CAD (coronary artery disease)   Morbid obesity (HCC)   Bilateral lower extremity edema   Iron deficiency anemia following bariatric surgery   Hyperlipidemia    Spencer Herring is a 67 y.o. male  with a past medical history of coronary  artery disease s/p PCI to LAD 10/2017 and PCI to OM 2 01/2018, angina, hypertension, hyperlipidemia, anemia, anxiety who presented to the ED on 01/01/2023 for shortness of breath, leg swelling.  Cardiology is for further evaluation and management of new onset atrial fibrillation and acute heart failure.    # Acute heart failure # Shortness of breath  # Bilateral lower extremity edema Patient with worsening shortness of breath and lower extremity edema over the last week.  BNP in the ED 226.  Prior cardiac MRI 2021 with EF 62% -Echo shows newly reduced LVEF 20-25% -S/p IV Lasix 20 mg x 1, IV Lasix 60 mg x1 -Continue Lasix 60 mg IV BID -Continue home losartan 25 mg daily -Continue Farxiga 10 mg daily -Appreciate heart failure pharmacist recommendations   # New onset atrial fibrillation RVR  Patient presenting to the ED with shortness of breath found to be in new onset atrial fibrillation on EKG with rate in the 130s. -NO cardizem as LVEF is significantly reduced (visually observed by me while he was getting echo) -Anticoagulation with Eliquis 5 mg BID. Continue on heparin gtt until after cath on Monday   #Unstable angina  #Coronary artery disease s/p PCI to LAD and OM 2 in 2019 # Hyperlipidemia -Continue home atorvastatin 40 -Continue ASA and heparin gtt -Continue home Ranexa 500 mg twice daily -Plan for diagnostic left heart catheterization with possible intervention on Monday 01/05/2023.  Schedule for cardiac catheterization, and possible angioplasty. We discussed regarding risks, benefits, alternatives to this including stress testing, CTA and continued medical therapy. Patient wants to proceed. Understands <1-2% risk of death, stroke, MI, urgent CABG, bleeding, infection, renal failure but not limited to these.       Spencer Searing Awesome Jared, DO  01/04/2023, 1:40 PM [facility]

## 2023-01-04 NOTE — Plan of Care (Signed)
  Problem: Education: Goal: Ability to demonstrate management of disease process will improve Outcome: Progressing Goal: Ability to verbalize understanding of medication therapies will improve Outcome: Progressing Goal: Individualized Educational Video(s) Outcome: Progressing   Problem: Activity: Goal: Capacity to carry out activities will improve Outcome: Progressing   Problem: Cardiac: Goal: Ability to achieve and maintain adequate cardiopulmonary perfusion will improve Outcome: Progressing   Problem: Education: Goal: Understanding of CV disease, CV risk reduction, and recovery process will improve Outcome: Progressing Goal: Individualized Educational Video(s) Outcome: Progressing   Problem: Activity: Goal: Ability to return to baseline activity level will improve Outcome: Progressing   Problem: Cardiovascular: Goal: Ability to achieve and maintain adequate cardiovascular perfusion will improve Outcome: Progressing Goal: Vascular access site(s) Level 0-1 will be maintained Outcome: Progressing   Problem: Health Behavior/Discharge Planning: Goal: Ability to safely manage health-related needs after discharge will improve Outcome: Progressing   Problem: Education: Goal: Knowledge of General Education information will improve Description: Including pain rating scale, medication(s)/side effects and non-pharmacologic comfort measures Outcome: Progressing   Problem: Health Behavior/Discharge Planning: Goal: Ability to manage health-related needs will improve Outcome: Progressing   Problem: Clinical Measurements: Goal: Ability to maintain clinical measurements within normal limits will improve Outcome: Progressing Goal: Will remain free from infection Outcome: Progressing Goal: Diagnostic test results will improve Outcome: Progressing Goal: Respiratory complications will improve Outcome: Progressing Goal: Cardiovascular complication will be avoided Outcome:  Progressing   Problem: Activity: Goal: Risk for activity intolerance will decrease Outcome: Progressing   Problem: Nutrition: Goal: Adequate nutrition will be maintained Outcome: Progressing   Problem: Coping: Goal: Level of anxiety will decrease Outcome: Progressing   Problem: Elimination: Goal: Will not experience complications related to bowel motility Outcome: Progressing Goal: Will not experience complications related to urinary retention Outcome: Progressing   Problem: Pain Managment: Goal: General experience of comfort will improve Outcome: Progressing   Problem: Safety: Goal: Ability to remain free from injury will improve Outcome: Progressing   Problem: Skin Integrity: Goal: Risk for impaired skin integrity will decrease Outcome: Progressing   

## 2023-01-04 NOTE — Progress Notes (Signed)
Pt provided verbal consent for R groin and R wrist hair clipping.

## 2023-01-04 NOTE — Progress Notes (Signed)
PROGRESS NOTE    Spencer Herring  ZOX:[id number] DOB: 07-13-1955 DOA: 01/01/2023 PCP: Barbette Reichmann, MD    Brief Narrative:  67 year old male with history of CAD status post PCI, hypertension, hyperlipidemia, anxiety, angina, who presents to the emergency department for chief concerns of shortness of breath and fatigue with bilateral leg swelling.He reports shortness of breath and chest pain worse with exertion. He denies current chest pain and shortness of breath currently at bedside. He endorses fatigue. These symptoms improve with rest.  He denies trauma to his person, including chest area. He endorses compliance with home medications.  He endorses associated bilateral lower extremity edema.  He endorses feelings of fever, with tmax of 100 at home, chills, and nausea. He did not take anything at home for these symptoms. He denies vomiting.   9/6: Seen in consultation by cardiology.  Recommendations appreciated.  Left ventricular ejection fraction markedly reduced on direct visualization by cardiologist.  Patient started on Lasix 60 mg IV twice daily.  Cardizem discontinued.  Contraindicated the setting of reduced ejection fraction.  9/8: No acute events       Assessment & Plan:   Principal Problem:   Acute exacerbation of CHF (congestive heart failure) (HCC) Active Problems:   CAD (coronary artery disease)   Morbid obesity (HCC)   Bilateral lower extremity edema   Iron deficiency anemia following bariatric surgery   Hyperlipidemia  Acute exacerbation of systolic congestive heart failure Left-ventricular ejection fraction markedly reduced on direct visualization by cardiologist.  Formal evaluation and read is pending.  Received 20 mg IV Lasix in ED. Plan: Continue Lasix 60 mg IV twice daily Continue Farxiga 10 mg daily Strict ins and outs, not accurately recorded Daily weights Left heart catheterization with possible intervention Monday 9/9  New onset atrial fibrillation  with rapid ventricular response Patient started on Cardizem gtt.  Now discontinued and will avoid diltiazem given reduced ejection fraction. Plan: Continue Coreg 25 mg p.o. twice daily Heparin GTT for anticoagulation while awaiting cath Plan to transition to Eliquis prior to discharge Telemetry monitor  Hyperlipidemia Statin   Iron deficiency anemia following bariatric surgery Home iron replacement   Morbid obesity (HCC) This meets criteria for morbid obesity based on the presence of 1 or more chronic comorbidities. Patient has BMI of 38.28 and CAD. This complicates overall care and prognosis.    CAD (coronary artery disease) Aspirin 81 mg daily,  atorvastatin 40 mg nightly,  losartan 25 mg daily Ranexa 500 mg p.o. twice daily resumed   DVT prophylaxis: IV heparin Code Status: Full Family Communication: None today Disposition Plan: Status is: Inpatient Remains inpatient appropriate because: Exacerbation of heart failure.  Rapid atrial fibrillation.  Plan for cardiac catheterization on 8/9   Level of care: Telemetry Cardiac  Consultants:  Cardiology  Procedures:  None  Antimicrobials: None   Subjective: Seen and examined.  Sitting up in chair.  No visible distress  Objective: Vitals:   01/03/23 2029 01/04/23 0030 01/04/23 0416 01/04/23 1006  BP: 102/73 116/79 107/78 107/85  Pulse: 92 70 86 89  Resp: 18 18 16 16   Temp: 97.8 F (36.6 C) 97.9 F (36.6 C) 97.9 F (36.6 C) 98.2 F (36.8 C)  TempSrc: Oral     SpO2: 94% 97% 97% 97%  Weight:      Height:        Intake/Output Summary (Last 24 hours) at 01/04/2023 1215 Last data filed at 01/04/2023 0600 Gross per 24 hour  Intake 1045.58 ml  Output  1800 ml  Net -754.42 ml   Filed Weights   01/01/23 1225 01/03/23 0826  Weight: 101.2 kg 98.4 kg    Examination:  General exam: NAD Respiratory system: Crackles.  Normal work of breathing.  Room air Cardiovascular system: S1-S2, regular rate, irregular rhythm,  no murmurs, 1+ pitting edema BLE Gastrointestinal system: Obese, soft, NT/ND, normal bowel sounds Central nervous system: Alert and oriented. No focal neurological deficits. Extremities: Symmetric 5 x 5 power. Skin: No rashes, lesions or ulcers Psychiatry: Judgement and insight appear normal. Mood & affect appropriate.     Data Reviewed: I have personally reviewed following labs and imaging studies  CBC: Recent Labs  Lab 01/01/23 1228 01/02/23 0428 01/03/23 0636 01/04/23 0413  WBC 7.7 5.5 6.3 6.2  HGB 12.3* 12.1* 13.4 12.7*  HCT 38.4* 36.9* 39.7 37.7*  MCV 91.4 90.4 87.3 87.3  PLT 264 239 245 234   Basic Metabolic Panel: Recent Labs  Lab 01/01/23 1228 01/02/23 0428 01/03/23 0943 01/04/23 0411  NA 138 138 135 135  K 4.3 3.9 3.9 3.7  CL 100 100 97* 100  CO2 27 29 27 29   GLUCOSE 98 88 140* 90  BUN 22 21 31* 29*  CREATININE 0.94 0.76 0.97 0.81  CALCIUM 9.2 8.9 9.3 8.9  MG  --   --  2.7* 2.6*   GFR: Estimated Creatinine Clearance: 97.6 mL/min (by C-G formula based on SCr of 0.81 mg/dL). Liver Function Tests: No results for input(s): "AST", "ALT", "ALKPHOS", "BILITOT", "PROT", "ALBUMIN" in the last 168 hours. No results for input(s): "LIPASE", "AMYLASE" in the last 168 hours. No results for input(s): "AMMONIA" in the last 168 hours. Coagulation Profile: Recent Labs  Lab 01/02/23 1331  INR 1.1   Cardiac Enzymes: No results for input(s): "CKTOTAL", "CKMB", "CKMBINDEX", "TROPONINI" in the last 168 hours. BNP (last 3 results) No results for input(s): "PROBNP" in the last 8760 hours. HbA1C: No results for input(s): "HGBA1C" in the last 72 hours. CBG: No results for input(s): "GLUCAP" in the last 168 hours. Lipid Profile: No results for input(s): "CHOL", "HDL", "LDLCALC", "TRIG", "CHOLHDL", "LDLDIRECT" in the last 72 hours. Thyroid Function Tests: No results for input(s): "TSH", "T4TOTAL", "FREET4", "T3FREE", "THYROIDAB" in the last 72 hours. Anemia Panel: No  results for input(s): "VITAMINB12", "FOLATE", "FERRITIN", "TIBC", "IRON", "RETICCTPCT" in the last 72 hours. Sepsis Labs: No results for input(s): "PROCALCITON", "LATICACIDVEN" in the last 168 hours.  Recent Results (from the past 240 hour(s))  Resp panel by RT-PCR (RSV, Flu A&B, Covid) Anterior Nasal Swab     Status: None   Collection Time: 01/01/23  1:04 PM   Specimen: Anterior Nasal Swab  Result Value Ref Range Status   SARS Coronavirus 2 by RT PCR NEGATIVE NEGATIVE Final    Comment: (NOTE) SARS-CoV-2 target nucleic acids are NOT DETECTED.  The SARS-CoV-2 RNA is generally detectable in upper respiratory specimens during the acute phase of infection. The lowest concentration of SARS-CoV-2 viral copies this assay can detect is 138 copies/mL. A negative result does not preclude SARS-Cov-2 infection and should not be used as the sole basis for treatment or other patient management decisions. A negative result may occur with  improper specimen collection/handling, submission of specimen other than nasopharyngeal swab, presence of viral mutation(s) within the areas targeted by this assay, and inadequate number of viral copies(<138 copies/mL). A negative result must be combined with clinical observations, patient history, and epidemiological information. The expected result is Negative.  Fact Sheet for Patients:  BloggerCourse.com  Fact Sheet for Healthcare Providers:  SeriousBroker.it  This test is no t yet approved or cleared by the [place] FDA and  has been authorized for detection and/or diagnosis of SARS-CoV-2 by FDA under an Emergency Use Authorization (EUA). This EUA will remain  in effect (meaning this test can be used) for the duration of the COVID-19 declaration under Section 564(b)(1) of the Act, 21 U.S.C.section 360bbb-3(b)(1), unless the authorization is terminated  or revoked sooner.       Influenza A by PCR  NEGATIVE NEGATIVE Final   Influenza B by PCR NEGATIVE NEGATIVE Final    Comment: (NOTE) The Xpert Xpress SARS-CoV-2/FLU/RSV plus assay is intended as an aid in the diagnosis of influenza from Nasopharyngeal swab specimens and should not be used as a sole basis for treatment. Nasal washings and aspirates are unacceptable for Xpert Xpress SARS-CoV-2/FLU/RSV testing.  Fact Sheet for Patients: BloggerCourse.com  Fact Sheet for Healthcare Providers: SeriousBroker.it  This test is not yet approved or cleared by the [place] FDA and has been authorized for detection and/or diagnosis of SARS-CoV-2 by FDA under an Emergency Use Authorization (EUA). This EUA will remain in effect (meaning this test can be used) for the duration of the COVID-19 declaration under Section 564(b)(1) of the Act, 21 U.S.C. section 360bbb-3(b)(1), unless the authorization is terminated or revoked.     Resp Syncytial Virus by PCR NEGATIVE NEGATIVE Final    Comment: (NOTE) Fact Sheet for Patients: BloggerCourse.com  Fact Sheet for Healthcare Providers: SeriousBroker.it  This test is not yet approved or cleared by the [place] FDA and has been authorized for detection and/or diagnosis of SARS-CoV-2 by FDA under an Emergency Use Authorization (EUA). This EUA will remain in effect (meaning this test can be used) for the duration of the COVID-19 declaration under Section 564(b)(1) of the Act, 21 U.S.C. section 360bbb-3(b)(1), unless the authorization is terminated or revoked.  Performed at [facility], [address], [place], [place] [address]          Radiology Studies: ECHOCARDIOGRAM COMPLETE  Result Date: 01/02/2023    ECHOCARDIOGRAM REPORT   Patient Name:   Spencer Herring Date of Exam: 01/02/2023 Medical Rec #:  [id number]       Height:       64.0 in Accession #:    [id number]       Weight:       223.0 lb Date of Birth:  Aug 16, 1955       BSA:          2.049 m Patient Age:    66 years        BP:           126/88 mmHg Patient Gender: M               HR:           119 bpm. Exam Location:  [facility] Procedure: 2D Echo, Cardiac Doppler, Color Doppler and Intracardiac            Opacification Agent Indications:     CHF, Dyspnea  History:         Patient has no prior history of Echocardiogram examinations.                  CHF, CAD, Arrythmias:LBBB; Signs/Symptoms:Dyspnea.  Sonographer:     Mikki Harbor Referring Phys:  [id number] AMY N COX Diagnosing Phys: Debbe Odea MD  Sonographer Comments: Patient is obese. IMPRESSIONS  1. Left ventricular ejection  fraction, by estimation, is 20 to 25%. The left ventricle has severely decreased function. The left ventricle demonstrates global hypokinesis. There is mild left ventricular hypertrophy. Left ventricular diastolic parameters  are indeterminate.  2. Right ventricular systolic function is normal. The right ventricular size is mildly enlarged.  3. Left atrial size was moderately dilated.  4. The mitral valve is normal in structure. Mild to moderate mitral valve regurgitation.  5. The aortic valve was not well visualized. Aortic valve regurgitation is not visualized. Aortic valve sclerosis is present, with no evidence of aortic valve stenosis. FINDINGS  Left Ventricle: Left ventricular ejection fraction, by estimation, is 20 to 25%. The left ventricle has severely decreased function. The left ventricle demonstrates global hypokinesis. Definity contrast agent was given IV to delineate the left ventricular endocardial borders. The left ventricular internal cavity size was normal in size. There is mild left ventricular hypertrophy. Left ventricular diastolic parameters are indeterminate. Right Ventricle: The right ventricular size is mildly enlarged. No increase in right ventricular wall thickness. Right ventricular systolic function is normal. Left Atrium:  Left atrial size was moderately dilated. Right Atrium: Right atrial size was normal in size. Pericardium: There is no evidence of pericardial effusion. Mitral Valve: The mitral valve is normal in structure. Mild to moderate mitral valve regurgitation. MV peak gradient, 4.5 mmHg. The mean mitral valve gradient is 2.0 mmHg. Tricuspid Valve: The tricuspid valve is normal in structure. Tricuspid valve regurgitation is mild. Aortic Valve: The aortic valve was not well visualized. Aortic valve regurgitation is not visualized. Aortic valve sclerosis is present, with no evidence of aortic valve stenosis. Aortic valve mean gradient measures 4.0 mmHg. Aortic valve peak gradient measures 8.0 mmHg. Aortic valve area, by VTI measures 2.53 cm. Pulmonic Valve: The pulmonic valve was normal in structure. Pulmonic valve regurgitation is mild. Aorta: The aortic root is normal in size and structure. Venous: The inferior vena cava was not well visualized. IAS/Shunts: No atrial level shunt detected by color flow Doppler.  LEFT VENTRICLE PLAX 2D LVIDd:         5.30 cm LVIDs:         4.90 cm LV PW:         1.30 cm LV IVS:        1.20 cm LVOT diam:     2.10 cm LV SV:         65 LV SV Index:   32 LVOT Area:     3.46 cm  RIGHT VENTRICLE RV Basal diam:  4.00 cm RV Mid diam:    3.30 cm RV S prime:     12.10 cm/s LEFT ATRIUM              Index        RIGHT ATRIUM           Index LA diam:        4.50 cm  2.20 cm/m   RA Area:     21.30 cm LA Vol (A2C):   114.0 ml 55.64 ml/m  RA Volume:   68.60 ml  33.48 ml/m LA Vol (A4C):   94.8 ml  46.27 ml/m LA Biplane Vol: 105.0 ml 51.24 ml/m  AORTIC VALVE                    PULMONIC VALVE AV Area (Vmax):    2.42 cm     PV Vmax:       1.13 m/s AV Area (Vmean):   2.43 cm  PV Peak grad:  5.1 mmHg AV Area (VTI):     2.53 cm AV Vmax:           141.50 cm/s AV Vmean:          87.500 cm/s AV VTI:            0.255 m AV Peak Grad:      8.0 mmHg AV Mean Grad:      4.0 mmHg LVOT Vmax:         98.80 cm/s  LVOT Vmean:        61.450 cm/s LVOT VTI:          0.186 m LVOT/AV VTI ratio: 0.73  AORTA Ao Root diam: 3.70 cm Ao Asc diam:  3.50 cm MITRAL VALVE               TRICUSPID VALVE MV Area (PHT): 6.71 cm    TR Peak grad:   15.2 mmHg MV Area VTI:   3.51 cm    TR Vmax:        195.00 cm/s MV Peak grad:  4.5 mmHg MV Mean grad:  2.0 mmHg    SHUNTS MV Vmax:       1.06 m/s    Systemic VTI:  0.19 m MV Vmean:      61.7 cm/s   Systemic Diam: 2.10 cm MV Decel Time: 113 msec MV E velocity: 80.20 cm/s Debbe Odea MD Electronically signed by Debbe Odea MD Signature Date/Time: 01/02/2023/2:14:20 PM    Final         Scheduled Meds:  ascorbic acid  250 mg Oral Daily   aspirin EC  81 mg Oral Daily   atorvastatin  40 mg Oral QHS   carvedilol  25 mg Oral BID WC   dapagliflozin propanediol  10 mg Oral Daily   ferrous sulfate  325 mg Oral BID WC   furosemide  60 mg Intravenous Q12H   gabapentin  300 mg Oral QHS   influenza vac split trivalent PF  0.5 mL Intramuscular Tomorrow-1000   losartan  25 mg Oral Daily   multivitamin with minerals  1 tablet Oral Daily   ranolazine  500 mg Oral BID   Continuous Infusions:  heparin 1,250 Units/hr (01/04/23 1025)     LOS: 3 days     Tresa Moore, MD [facility]   If 7PM-7AM, please contact night-coverage  01/04/2023, 12:15 PM

## 2023-01-04 NOTE — Consult Note (Signed)
ANTICOAGULATION CONSULT NOTE  Pharmacy Consult for IV Heparin Indication: chest pain/ACS and atrial fibrillation  Patient Measurements: Height: 5\' 4"  (162.6 cm) Weight: 98.4 kg (216 lb 14.4 oz) IBW/kg (Calculated) : 59.2 Heparin Dosing Weight: 82.1 kg  Labs: Recent Labs    01/01/23 1228 01/01/23 1427 01/02/23 0428 01/02/23 0428 01/02/23 1331 01/03/23 0636 01/03/23 0943 01/03/23 1336 01/03/23 1930 01/04/23 0413  HGB 12.3*  --  12.1*  --   --  13.4  --   --   --  12.7*  HCT 38.4*  --  36.9*  --   --  39.7  --   --   --  37.7*  PLT 264  --  239  --   --  245  --   --   --  234  APTT  --   --   --    < > 36 122*  --  83* 73* 88*  LABPROT  --   --   --   --  14.3  --   --   --   --   --   INR  --   --   --   --  1.1  --   --   --   --   --   HEPARINUNFRC  --   --   --   --   --  >1.10*  --   --   --  1.02*  CREATININE 0.94  --  0.76  --   --   --  0.97  --   --   --   TROPONINIHS 9 10  --   --   --   --   --   --   --   --    < > = values in this interval not displayed.    Estimated Creatinine Clearance: 81.5 mL/min (by C-G formula based on SCr of 0.97 mg/dL).   Medical History: Past Medical History:  Diagnosis Date   Anemia    Asthma    COPD (chronic obstructive pulmonary disease) (HCC)    Morbid obesity (HCC)    Neuromuscular disorder (HCC)    Sleep apnea     Medications:  New start apixaban 5 mg BID inpatient for Afib ordered and given x 1 dose, 01/02/23 at 1146  Assessment: 67 y/o M with medical history as above and including CAD admitted with new-onset Afib and acute HF. Pharmacy consulted to initiate and manage heparin infusion.  Baseline aPTT and PT-INR are pending. Will anticipate elevated baseline HL given recent administration of apixaban but likely fast washout.  9/7 0636 aPTT 122 HL > 1.1 9/7 1336 aPTT 83   9/7 1930 aPTT 73 9/8 0413 aPTT 88, therapeutic x 3 / HL 1.02, not correlating  Goal of Therapy:  Heparin level 0.3-0.7 units/ml once aPTT and  heparin level correlate.  aPTT 66 - 102 seconds Monitor platelets by anticoagulation protocol: Yes   Plan:  aPTT is therapeutic x 3. Continue heparin infusion at 1250 units/hr. Next aPTT with AM labs. Heparin level and CBC with AM labs.   Otelia Sergeant, PharmD, [facility] 01/04/2023 4:53 AM

## 2023-01-05 ENCOUNTER — Encounter
Admission: EM | Disposition: A | Discharge status: Home / Self Care | Payer: Self-pay | Source: Home / Self Care | Attending: Internal Medicine

## 2023-01-05 ENCOUNTER — Inpatient Hospital Stay: Payer: Medicare HMO

## 2023-01-05 DIAGNOSIS — I5023 Acute on chronic systolic (congestive) heart failure: Secondary | ICD-10-CM | POA: Diagnosis not present

## 2023-01-05 DIAGNOSIS — I509 Heart failure, unspecified: Secondary | ICD-10-CM

## 2023-01-05 HISTORY — PX: LEFT HEART CATH AND CORONARY ANGIOGRAPHY: CATH118249

## 2023-01-05 HISTORY — PX: CORONARY STENT INTERVENTION: CATH118234

## 2023-01-05 LAB — POCT ACTIVATED CLOTTING TIME
Activated Clotting Time: 299 s
Activated Clotting Time: 317 s

## 2023-01-05 LAB — BASIC METABOLIC PANEL
Anion gap: 9 (ref 5–15)
BUN: 30 mg/dL — ABNORMAL HIGH (ref 8–23)
CO2: 30 mmol/L (ref 22–32)
Calcium: 9.1 mg/dL (ref 8.9–10.3)
Chloride: 99 mmol/L (ref 98–111)
Creatinine, Ser: 0.96 mg/dL (ref 0.61–1.24)
GFR, Estimated: >60 mL/min (ref >60)
Glucose, Bld: 92 mg/dL (ref 70–99)
Potassium: 3.7 mmol/L (ref 3.5–5.1)
Sodium: 138 mmol/L (ref 135–145)

## 2023-01-05 LAB — CBC
HCT: 40.7 % (ref 39.0–52.0)
Hemoglobin: 13.4 g/dL (ref 13.0–17.0)
MCH: 29.4 pg (ref 26.0–34.0)
MCHC: 32.9 g/dL (ref 30.0–36.0)
MCV: 89.3 fL (ref 80.0–100.0)
Platelets: 244 10*3/uL (ref 150–400)
RBC: 4.56 MIL/uL (ref 4.22–5.81)
RDW: 13.5 % (ref 11.5–15.5)
WBC: 6.3 10*3/uL (ref 4.0–10.5)
nRBC: 0 % (ref 0.0–0.2)

## 2023-01-05 LAB — TROPONIN I (HIGH SENSITIVITY)
Troponin I (High Sensitivity): 6 ng/L (ref <18)
Troponin I (High Sensitivity): 6 ng/L (ref <18)

## 2023-01-05 LAB — APTT: aPTT: 95 s — ABNORMAL HIGH (ref 24–36)

## 2023-01-05 LAB — HEPARIN LEVEL (UNFRACTIONATED): Heparin Unfractionated: 0.63 [IU]/mL (ref 0.30–0.70)

## 2023-01-05 SURGERY — LEFT HEART CATH AND CORONARY ANGIOGRAPHY
Anesthesia: Moderate Sedation

## 2023-01-05 MED ORDER — MIDAZOLAM HCL 2 MG/2ML IJ SOLN
INTRAMUSCULAR | Status: AC
  Filled 2023-01-05: qty 2

## 2023-01-05 MED ORDER — FAMOTIDINE 20 MG PO TABS
20.0000 mg | ORAL_TABLET | Freq: Two times a day (BID) | ORAL | Status: DC
  Administered 2023-01-05 – 2023-01-06 (×2): 20 mg via ORAL
  Filled 2023-01-05 (×2): qty 1

## 2023-01-05 MED ORDER — HEPARIN SODIUM (PORCINE) 1000 UNIT/ML IJ SOLN
INTRAMUSCULAR | Status: DC | PRN
  Administered 2023-01-05: 2000 [IU] via INTRAVENOUS
  Administered 2023-01-05: 10000 [IU] via INTRAVENOUS

## 2023-01-05 MED ORDER — IOHEXOL 300 MG/ML  SOLN
INTRAMUSCULAR | Status: DC | PRN
  Administered 2023-01-05: 185 mL

## 2023-01-05 MED ORDER — HYDRALAZINE HCL 20 MG/ML IJ SOLN: 10.0000 mg | INTRAMUSCULAR | Status: AC | PRN

## 2023-01-05 MED ORDER — HEPARIN SODIUM (PORCINE) 1000 UNIT/ML IJ SOLN
INTRAMUSCULAR | Status: AC
  Filled 2023-01-05: qty 10

## 2023-01-05 MED ORDER — LABETALOL HCL 5 MG/ML IV SOLN: 10.0000 mg | INTRAVENOUS | Status: AC | PRN

## 2023-01-05 MED ORDER — ONDANSETRON HCL 4 MG/2ML IJ SOLN: 4.0000 mg | Freq: Four times a day (QID) | INTRAMUSCULAR | Status: DC | PRN

## 2023-01-05 MED ORDER — LIDOCAINE HCL (PF) 1 % IJ SOLN
INTRAMUSCULAR | Status: DC | PRN
  Administered 2023-01-05: 2 mL

## 2023-01-05 MED ORDER — VERAPAMIL HCL 2.5 MG/ML IV SOLN
INTRAVENOUS | Status: AC
  Filled 2023-01-05: qty 2

## 2023-01-05 MED ORDER — FUROSEMIDE 20 MG PO TABS
20.0000 mg | ORAL_TABLET | Freq: Every day | ORAL | Status: DC
  Administered 2023-01-06: 20 mg via ORAL
  Filled 2023-01-05: qty 1

## 2023-01-05 MED ORDER — HEPARIN (PORCINE) IN NACL 2000-0.9 UNIT/L-% IV SOLN
INTRAVENOUS | Status: DC | PRN
  Administered 2023-01-05: 1000 mL

## 2023-01-05 MED ORDER — SODIUM CHLORIDE 0.9 % IV SOLN: 250.0000 mL | INTRAVENOUS | Status: DC | PRN

## 2023-01-05 MED ORDER — LIDOCAINE HCL 1 % IJ SOLN
INTRAMUSCULAR | Status: AC
  Filled 2023-01-05: qty 20

## 2023-01-05 MED ORDER — SPIRONOLACTONE 12.5 MG HALF TABLET
12.5000 mg | ORAL_TABLET | Freq: Every day | ORAL | Status: DC
  Administered 2023-01-06: 12.5 mg via ORAL
  Filled 2023-01-05: qty 1

## 2023-01-05 MED ORDER — APIXABAN 5 MG PO TABS
5.0000 mg | ORAL_TABLET | Freq: Two times a day (BID) | ORAL | Status: DC
  Administered 2023-01-05 – 2023-01-06 (×2): 5 mg via ORAL
  Filled 2023-01-05 (×2): qty 1

## 2023-01-05 MED ORDER — ACETAMINOPHEN 325 MG PO TABS
650.0000 mg | ORAL_TABLET | ORAL | Status: DC | PRN
  Administered 2023-01-05: 650 mg via ORAL

## 2023-01-05 MED ORDER — ALUM & MAG HYDROXIDE-SIMETH 200-200-20 MG/5ML PO SUSP: 30.0000 mL | ORAL | Status: DC | PRN

## 2023-01-05 MED ORDER — FENTANYL CITRATE (PF) 100 MCG/2ML IJ SOLN
INTRAMUSCULAR | Status: AC
  Filled 2023-01-05: qty 2

## 2023-01-05 MED ORDER — ASPIRIN 81 MG PO CHEW
81.0000 mg | CHEWABLE_TABLET | Freq: Every day | ORAL | Status: DC
  Administered 2023-01-06: 81 mg via ORAL
  Filled 2023-01-05: qty 1

## 2023-01-05 MED ORDER — SODIUM CHLORIDE 0.9% FLUSH: 3.0000 mL | INTRAVENOUS | Status: DC | PRN

## 2023-01-05 MED ORDER — SODIUM CHLORIDE 0.9 % IV BOLUS
250.0000 mL | Freq: Once | INTRAVENOUS | Status: AC
  Administered 2023-01-05: 250 mL via INTRAVENOUS

## 2023-01-05 MED ORDER — MORPHINE SULFATE (PF) 2 MG/ML IV SOLN
2.0000 mg | INTRAVENOUS | Status: DC | PRN
  Filled 2023-01-05: qty 1

## 2023-01-05 MED ORDER — VERAPAMIL HCL 2.5 MG/ML IV SOLN
INTRAVENOUS | Status: DC | PRN
  Administered 2023-01-05 (×2): 2.5 mg via INTRAVENOUS

## 2023-01-05 MED ORDER — MIDAZOLAM HCL 2 MG/2ML IJ SOLN
INTRAMUSCULAR | Status: DC | PRN
  Administered 2023-01-05: 1 mg via INTRAVENOUS

## 2023-01-05 MED ORDER — CLOPIDOGREL BISULFATE 75 MG PO TABS
ORAL_TABLET | ORAL | Status: AC
  Filled 2023-01-05: qty 8

## 2023-01-05 MED ORDER — ASPIRIN 81 MG PO CHEW
81.0000 mg | CHEWABLE_TABLET | ORAL | Status: AC
  Administered 2023-01-05: 81 mg via ORAL
  Filled 2023-01-05: qty 1

## 2023-01-05 MED ORDER — CLOPIDOGREL BISULFATE 75 MG PO TABS
ORAL_TABLET | ORAL | Status: DC | PRN
  Administered 2023-01-05: 600 mg via ORAL

## 2023-01-05 MED ORDER — ASPIRIN 81 MG PO CHEW
CHEWABLE_TABLET | ORAL | Status: DC | PRN
  Administered 2023-01-05: 243 mg via ORAL

## 2023-01-05 MED ORDER — ATORVASTATIN CALCIUM 80 MG PO TABS
80.0000 mg | ORAL_TABLET | Freq: Every day | ORAL | Status: DC
  Administered 2023-01-05: 80 mg via ORAL
  Filled 2023-01-05: qty 1

## 2023-01-05 MED ORDER — SODIUM CHLORIDE 0.9 % IV SOLN: INTRAVENOUS | Status: DC

## 2023-01-05 MED ORDER — FENTANYL CITRATE (PF) 100 MCG/2ML IJ SOLN
INTRAMUSCULAR | Status: DC | PRN
  Administered 2023-01-05: 25 ug via INTRAVENOUS
  Administered 2023-01-05: 50 ug via INTRAVENOUS

## 2023-01-05 MED ORDER — ASPIRIN 81 MG PO CHEW: 81.0000 mg | CHEWABLE_TABLET | ORAL | Status: DC

## 2023-01-05 MED ORDER — SODIUM CHLORIDE 0.9 % WEIGHT BASED INFUSION: 1.0000 mL/kg/h | INTRAVENOUS | Status: DC

## 2023-01-05 MED ORDER — HEPARIN (PORCINE) IN NACL 1000-0.9 UT/500ML-% IV SOLN
INTRAVENOUS | Status: AC
  Filled 2023-01-05: qty 1000

## 2023-01-05 MED ORDER — SODIUM CHLORIDE 0.9 % WEIGHT BASED INFUSION: 3.0000 mL/kg/h | INTRAVENOUS | Status: AC

## 2023-01-05 MED ORDER — FUROSEMIDE 10 MG/ML IJ SOLN
60.0000 mg | Freq: Two times a day (BID) | INTRAMUSCULAR | Status: AC
  Administered 2023-01-05: 60 mg via INTRAVENOUS
  Filled 2023-01-05: qty 6

## 2023-01-05 MED ORDER — CLOPIDOGREL BISULFATE 75 MG PO TABS
75.0000 mg | ORAL_TABLET | Freq: Every day | ORAL | Status: DC
  Administered 2023-01-06: 75 mg via ORAL
  Filled 2023-01-05: qty 1

## 2023-01-05 MED ORDER — SODIUM CHLORIDE 0.9% FLUSH
3.0000 mL | Freq: Two times a day (BID) | INTRAVENOUS | Status: DC
  Administered 2023-01-05 – 2023-01-06 (×2): 3 mL via INTRAVENOUS

## 2023-01-05 MED ORDER — PANTOPRAZOLE SODIUM 40 MG PO TBEC
40.0000 mg | DELAYED_RELEASE_TABLET | Freq: Every day | ORAL | Status: DC
  Administered 2023-01-05 – 2023-01-06 (×2): 40 mg via ORAL
  Filled 2023-01-05 (×2): qty 1

## 2023-01-05 MED ORDER — ASPIRIN 81 MG PO CHEW
CHEWABLE_TABLET | ORAL | Status: AC
  Filled 2023-01-05: qty 3

## 2023-01-05 SURGICAL SUPPLY — 19 items
BALLN TREK RX 2.5X15 (BALLOONS) ×1
BALLN ~~LOC~~ TREK NEO RX 3.5X20 (BALLOONS) IMPLANT
BALLOON TREK RX 2.5X15 (BALLOONS) IMPLANT
CATH INFINITI 5FR MULTPACK ANG (CATHETERS) IMPLANT
CATH VISTA GUIDE 6FR JR4 (CATHETERS) IMPLANT
DEVICE RAD TR BAND REGULAR (VASCULAR PRODUCTS) IMPLANT
DRAPE BRACHIAL (DRAPES) IMPLANT
GLIDESHEATH SLEND SS 6F .021 (SHEATH) IMPLANT
GUIDEWIRE INQWIRE 1.5J.035X260 (WIRE) IMPLANT
INQWIRE 1.5J .035X260CM (WIRE) ×1
KIT ENCORE 26 ADVANTAGE (KITS) IMPLANT
PACK CARDIAC CATH (CUSTOM PROCEDURE TRAY) ×1 IMPLANT
PROTECTION STATION PRESSURIZED (MISCELLANEOUS) ×1
SET ATX-X65L (MISCELLANEOUS) IMPLANT
SHEATH GLIDE SLENDER 4/5FR (SHEATH) IMPLANT
STATION PROTECTION PRESSURIZED (MISCELLANEOUS) IMPLANT
STENT ONYX FRONTIER 3.0X22 (Permanent Stent) IMPLANT
TUBING CIL FLEX 10 FLL-RA (TUBING) IMPLANT
WIRE ASAHI PROWATER 180CM (WIRE) IMPLANT

## 2023-01-05 NOTE — Consult Note (Signed)
ANTICOAGULATION CONSULT NOTE  Pharmacy Consult for IV Heparin Indication: chest pain/ACS and atrial fibrillation  Patient Measurements: Height: 5\' 4"  (162.6 cm) Weight: 98.4 kg (216 lb 14.4 oz) IBW/kg (Calculated) : 59.2 Heparin Dosing Weight: 82.1 kg  Labs: Recent Labs    01/02/23 1331 01/02/23 1331 01/03/23 0636 01/03/23 0943 01/03/23 1336 01/03/23 1930 01/04/23 0411 01/04/23 0413 01/05/23 0041 01/05/23 0433  HGB  --    < > 13.4  --   --   --   --  12.7*  --  13.4  HCT  --   --  39.7  --   --   --   --  37.7*  --  40.7  PLT  --   --  245  --   --   --   --  234  --  244  APTT 36  --  122*  --    < > 73*  --  88*  --  95*  LABPROT 14.3  --   --   --   --   --   --   --   --   --   INR 1.1  --   --   --   --   --   --   --   --   --   HEPARINUNFRC  --   --  >1.10*  --   --   --   --  1.02*  --  0.63  CREATININE  --   --   --  0.97  --   --  0.81  --   --   --   TROPONINIHS  --   --   --   --   --   --   --   --  6  --    < > = values in this interval not displayed.    Estimated Creatinine Clearance: 97.6 mL/min (by C-G formula based on SCr of 0.81 mg/dL).   Medical History: Past Medical History:  Diagnosis Date   Anemia    Asthma    COPD (chronic obstructive pulmonary disease) (HCC)    Morbid obesity (HCC)    Neuromuscular disorder (HCC)    Sleep apnea     Medications:  New start apixaban 5 mg BID inpatient for Afib ordered and given x 1 dose, 01/02/23 at 1146  Assessment: 67 y/o M with medical history as above and including CAD admitted with new-onset Afib and acute HF. Pharmacy consulted to initiate and manage heparin infusion.  Baseline aPTT and PT-INR are pending. Will anticipate elevated baseline HL given recent administration of apixaban but likely fast washout.  9/7 0636 aPTT 122 HL > 1.1 9/7 1336 aPTT 83   9/7 1930 aPTT 73 9/8 0413 aPTT 88, therapeutic x 3 / HL 1.02, not correlating 9/9 0433 aPTT 95, therapeutic x 4 / HL 0.63, correlating x  1  Goal of Therapy:  Heparin level 0.3-0.7 units/ml once aPTT and heparin level correlate.  aPTT 66 - 102 seconds Monitor platelets by anticoagulation protocol: Yes   Plan:  aPTT is therapeutic x 4. Continue heparin infusion at 1250 units/hr. Next aPTT with AM labs. Heparin level and CBC with AM labs.   Otelia Sergeant, PharmD, [facility] 01/05/2023 5:12 AM

## 2023-01-05 NOTE — Plan of Care (Signed)
  Problem: Education: Goal: Ability to demonstrate management of disease process will improve Outcome: Progressing Goal: Ability to verbalize understanding of medication therapies will improve Outcome: Progressing Goal: Individualized Educational Video(s) Outcome: Progressing   Problem: Activity: Goal: Capacity to carry out activities will improve Outcome: Progressing   Problem: Cardiac: Goal: Ability to achieve and maintain adequate cardiopulmonary perfusion will improve Outcome: Progressing   Problem: Education: Goal: Understanding of CV disease, CV risk reduction, and recovery process will improve Outcome: Progressing Goal: Individualized Educational Video(s) Outcome: Progressing   Problem: Activity: Goal: Ability to return to baseline activity level will improve Outcome: Progressing   Problem: Cardiovascular: Goal: Ability to achieve and maintain adequate cardiovascular perfusion will improve Outcome: Progressing Goal: Vascular access site(s) Level 0-1 will be maintained Outcome: Progressing   Problem: Health Behavior/Discharge Planning: Goal: Ability to safely manage health-related needs after discharge will improve Outcome: Progressing   Problem: Education: Goal: Knowledge of General Education information will improve Description: Including pain rating scale, medication(s)/side effects and non-pharmacologic comfort measures Outcome: Progressing   Problem: Health Behavior/Discharge Planning: Goal: Ability to manage health-related needs will improve Outcome: Progressing   Problem: Clinical Measurements: Goal: Ability to maintain clinical measurements within normal limits will improve Outcome: Progressing Goal: Will remain free from infection Outcome: Progressing Goal: Diagnostic test results will improve Outcome: Progressing Goal: Respiratory complications will improve Outcome: Progressing Goal: Cardiovascular complication will be avoided Outcome:  Progressing   Problem: Activity: Goal: Risk for activity intolerance will decrease Outcome: Progressing   Problem: Nutrition: Goal: Adequate nutrition will be maintained Outcome: Progressing   Problem: Coping: Goal: Level of anxiety will decrease Outcome: Progressing   Problem: Elimination: Goal: Will not experience complications related to bowel motility Outcome: Progressing Goal: Will not experience complications related to urinary retention Outcome: Progressing   Problem: Pain Managment: Goal: General experience of comfort will improve Outcome: Progressing   Problem: Safety: Goal: Ability to remain free from injury will improve Outcome: Progressing   Problem: Skin Integrity: Goal: Risk for impaired skin integrity will decrease Outcome: Progressing   

## 2023-01-05 NOTE — Care Management Important Message (Signed)
Important Message  Patient Details  Name: Spencer Herring MRN: [id number] Date of Birth: 06-18-1955   Medicare Important Message Given:  Yes  Out of room for procedure upon time of visit, no family in room.  Copy of Medicare IM left in room for reference.   Johnell Comings 01/05/2023, 11:16 AM

## 2023-01-05 NOTE — Progress Notes (Signed)
PROGRESS NOTE    Spencer Herring  NWG:[id number] DOB: 12-27-1955 DOA: 01/01/2023 PCP: Barbette Reichmann, MD    Brief Narrative:  67 year old male with history of CAD status post PCI, hypertension, hyperlipidemia, anxiety, angina, who presents to the emergency department for chief concerns of shortness of breath and fatigue with bilateral leg swelling.He reports shortness of breath and chest pain worse with exertion. He denies current chest pain and shortness of breath currently at bedside. He endorses fatigue. These symptoms improve with rest.  He denies trauma to his person, including chest area. He endorses compliance with home medications.  He endorses associated bilateral lower extremity edema.  He endorses feelings of fever, with tmax of 100 at home, chills, and nausea. He did not take anything at home for these symptoms. He denies vomiting.   9/6: Seen in consultation by cardiology.  Recommendations appreciated.  Left ventricular ejection fraction markedly reduced on direct visualization by cardiologist.  Patient started on Lasix 60 mg IV twice daily.  Cardizem discontinued.  Contraindicated the setting of reduced ejection fraction.  9/8: No acute events 9/9: Left heart catheterization today.  Status post PCI with DES to mid RCA.  Toller procedure well.  No chest pain or shortness of breath       Assessment & Plan:   Principal Problem:   Acute exacerbation of CHF (congestive heart failure) (HCC) Active Problems:   CAD (coronary artery disease)   Morbid obesity (HCC)   Bilateral lower extremity edema   Iron deficiency anemia following bariatric surgery   Hyperlipidemia  Acute exacerbation of systolic congestive heart failure Left-ventricular ejection fraction markedly reduced on direct visualization by cardiologist.  Formal evaluation and read is pending.  Received 20 mg IV Lasix in ED. Plan: Continue Lasix 60 mg IV twice daily for today Start Lasix 20 mg p.o. daily from  9/10 Continue Farxiga 10 mg daily Strict ins and outs, not accurately recorded Daily weights Possible discharge 9/10  New onset atrial fibrillation with rapid ventricular response Patient started on Cardizem gtt.  Now discontinued and will avoid diltiazem given reduced ejection fraction.  Rate control improved Plan: Continue Coreg 25 mg p.o. twice daily Discontinue IV heparin Start Eliquis 5 mg p.o. twice daily Telemetry monitor  Hyperlipidemia Statin   Iron deficiency anemia following bariatric surgery Home iron replacement   Morbid obesity (HCC) This meets criteria for morbid obesity based on the presence of 1 or more chronic comorbidities. Patient has BMI of 38.28 and CAD. This complicates overall care and prognosis.    CAD (coronary artery disease) Status post left heart catheterization with PCI to DES on 9/9 Plan: Coreg 25 mg twice daily Losartan 25 mg daily Eliquis 5 mg twice daily Clopidogrel 75 mg daily Nitro sublingual as needed Ranexa 500 mg twice daily   DVT prophylaxis: IV heparin Code Status: Full Family Communication: None today Disposition Plan: Status is: Inpatient Remains inpatient appropriate because: Exacerbation of heart failure.  Rapid atrial fibrillation.  Status post cardiac cath.  Possible discharge 9/10.   Level of care: Telemetry Cardiac  Consultants:  Cardiology  Procedures:  None  Antimicrobials: None   Subjective: Seen and examined.  Sitting up on edge of bed.  No visible distress  Objective: Vitals:   01/05/23 1043 01/05/23 1058 01/05/23 1113 01/05/23 1213  BP: 111/74 100/79 (!) 112/98 115/88  Pulse: 81 77 73 (!) 48  Resp: 15 13 16 18   Temp:    97.8 F (36.6 C)  TempSrc:    Oral  SpO2: 96% 92% 94% 96%  Weight:      Height:        Intake/Output Summary (Last 24 hours) at 01/05/2023 1351 Last data filed at 01/05/2023 0327 Gross per 24 hour  Intake 371.43 ml  Output --  Net 371.43 ml   Filed Weights   01/01/23 1225  01/03/23 0826 01/05/23 0981  Weight: 101.2 kg 98.4 kg 96.8 kg    Examination:  General exam: No apparent distress.  Patient appears well Respiratory system:  lungs clear.  Normal work of breathing.  Room air Cardiovascular system: S1 and 2, regular rate, irregular rhythm, no murmurs, 1+ pitting edema BLE Gastrointestinal system: Obese, soft, NT/ND, normal bowel sounds Central nervous system: Alert and oriented. No focal neurological deficits. Extremities: Symmetric 5 x 5 power. Skin: No rashes, lesions or ulcers Psychiatry: Judgement and insight appear normal. Mood & affect appropriate.     Data Reviewed: I have personally reviewed following labs and imaging studies  CBC: Recent Labs  Lab 01/01/23 1228 01/02/23 0428 01/03/23 0636 01/04/23 0413 01/05/23 0433  WBC 7.7 5.5 6.3 6.2 6.3  HGB 12.3* 12.1* 13.4 12.7* 13.4  HCT 38.4* 36.9* 39.7 37.7* 40.7  MCV 91.4 90.4 87.3 87.3 89.3  PLT 264 239 245 234 244   Basic Metabolic Panel: Recent Labs  Lab 01/01/23 1228 01/02/23 0428 01/03/23 0943 01/04/23 0411 01/05/23 0619  NA 138 138 135 135 138  K 4.3 3.9 3.9 3.7 3.7  CL 100 100 97* 100 99  CO2 27 29 27 29 30   GLUCOSE 98 88 140* 90 92  BUN 22 21 31* 29* 30*  CREATININE 0.94 0.76 0.97 0.81 0.96  CALCIUM 9.2 8.9 9.3 8.9 9.1  MG  --   --  2.7* 2.6*  --    GFR: Estimated Creatinine Clearance: 81.6 mL/min (by C-G formula based on SCr of 0.96 mg/dL). Liver Function Tests: No results for input(s): "AST", "ALT", "ALKPHOS", "BILITOT", "PROT", "ALBUMIN" in the last 168 hours. No results for input(s): "LIPASE", "AMYLASE" in the last 168 hours. No results for input(s): "AMMONIA" in the last 168 hours. Coagulation Profile: Recent Labs  Lab 01/02/23 1331  INR 1.1   Cardiac Enzymes: No results for input(s): "CKTOTAL", "CKMB", "CKMBINDEX", "TROPONINI" in the last 168 hours. BNP (last 3 results) No results for input(s): "PROBNP" in the last 8760 hours. HbA1C: No results for  input(s): "HGBA1C" in the last 72 hours. CBG: No results for input(s): "GLUCAP" in the last 168 hours. Lipid Profile: No results for input(s): "CHOL", "HDL", "LDLCALC", "TRIG", "CHOLHDL", "LDLDIRECT" in the last 72 hours. Thyroid Function Tests: No results for input(s): "TSH", "T4TOTAL", "FREET4", "T3FREE", "THYROIDAB" in the last 72 hours. Anemia Panel: No results for input(s): "VITAMINB12", "FOLATE", "FERRITIN", "TIBC", "IRON", "RETICCTPCT" in the last 72 hours. Sepsis Labs: No results for input(s): "PROCALCITON", "LATICACIDVEN" in the last 168 hours.  Recent Results (from the past 240 hour(s))  Resp panel by RT-PCR (RSV, Flu A&B, Covid) Anterior Nasal Swab     Status: None   Collection Time: 01/01/23  1:04 PM   Specimen: Anterior Nasal Swab  Result Value Ref Range Status   SARS Coronavirus 2 by RT PCR NEGATIVE NEGATIVE Final    Comment: (NOTE) SARS-CoV-2 target nucleic acids are NOT DETECTED.  The SARS-CoV-2 RNA is generally detectable in upper respiratory specimens during the acute phase of infection. The lowest concentration of SARS-CoV-2 viral copies this assay can detect is 138 copies/mL. A negative result does not preclude SARS-Cov-2 infection and  should not be used as the sole basis for treatment or other patient management decisions. A negative result may occur with  improper specimen collection/handling, submission of specimen other than nasopharyngeal swab, presence of viral mutation(s) within the areas targeted by this assay, and inadequate number of viral copies(<138 copies/mL). A negative result must be combined with clinical observations, patient history, and epidemiological information. The expected result is Negative.  Fact Sheet for Patients:  BloggerCourse.com  Fact Sheet for Healthcare Providers:  SeriousBroker.it  This test is no t yet approved or cleared by the [place] FDA and  has been authorized  for detection and/or diagnosis of SARS-CoV-2 by FDA under an Emergency Use Authorization (EUA). This EUA will remain  in effect (meaning this test can be used) for the duration of the COVID-19 declaration under Section 564(b)(1) of the Act, 21 U.S.C.section 360bbb-3(b)(1), unless the authorization is terminated  or revoked sooner.       Influenza A by PCR NEGATIVE NEGATIVE Final   Influenza B by PCR NEGATIVE NEGATIVE Final    Comment: (NOTE) The Xpert Xpress SARS-CoV-2/FLU/RSV plus assay is intended as an aid in the diagnosis of influenza from Nasopharyngeal swab specimens and should not be used as a sole basis for treatment. Nasal washings and aspirates are unacceptable for Xpert Xpress SARS-CoV-2/FLU/RSV testing.  Fact Sheet for Patients: BloggerCourse.com  Fact Sheet for Healthcare Providers: SeriousBroker.it  This test is not yet approved or cleared by the [place] FDA and has been authorized for detection and/or diagnosis of SARS-CoV-2 by FDA under an Emergency Use Authorization (EUA). This EUA will remain in effect (meaning this test can be used) for the duration of the COVID-19 declaration under Section 564(b)(1) of the Act, 21 U.S.C. section 360bbb-3(b)(1), unless the authorization is terminated or revoked.     Resp Syncytial Virus by PCR NEGATIVE NEGATIVE Final    Comment: (NOTE) Fact Sheet for Patients: BloggerCourse.com  Fact Sheet for Healthcare Providers: SeriousBroker.it  This test is not yet approved or cleared by the [place] FDA and has been authorized for detection and/or diagnosis of SARS-CoV-2 by FDA under an Emergency Use Authorization (EUA). This EUA will remain in effect (meaning this test can be used) for the duration of the COVID-19 declaration under Section 564(b)(1) of the Act, 21 U.S.C. section 360bbb-3(b)(1), unless the authorization is  terminated or revoked.  Performed at [facility], [address], [place], [place] [address]          Radiology Studies: CARDIAC CATHETERIZATION  Result Date: 01/05/2023   Prox RCA lesion is 40% stenosed.   RPDA lesion is 70% stenosed.   Prox RCA to Mid RCA lesion is 80% stenosed.   Ost LAD lesion is 30% stenosed.   1st Diag lesion is 60% stenosed.   Previously placed 2nd Mrg stent of unknown type is  widely patent.   Previously placed Prox LAD stent of unknown type is  widely patent.   A drug-eluting stent was successfully placed using a STENT ONYX FRONTIER [id number].   Post intervention, there is a 0% residual stenosis.   There is moderate to severe left ventricular systolic dysfunction.   The left ventricular ejection fraction is 25-35% by visual estimate. 1.  One-vessel CAD with patent stent proximal LAD, patent stent OM 2, discrete 85% stenosis mid RCA 2.  Dilated cardiomyopathy with moderate to severely reduced left ventricular function, with estimated LV ejection fraction 25-30% 3.  Successful PCI with 3.0 x 22 mm Onyx Frontier DES  mid RCA Recommendations 1.  Triple therapy with low-dose aspirin, clopidogrel, and Eliquis for 2-3 weeks then DC aspirin 2.  Good medical management for dilated cardiomyopathy and acute on chronic HFrEF 3.  Referral to EP for atrial fibrillation and likely ICD        Scheduled Meds:  apixaban  5 mg Oral BID   ascorbic acid  250 mg Oral Daily   [START ON 01/06/2023] aspirin  81 mg Oral Daily   aspirin EC  81 mg Oral Daily   atorvastatin  80 mg Oral QHS   carvedilol  25 mg Oral BID WC   [START ON 01/06/2023] clopidogrel  75 mg Oral Q breakfast   dapagliflozin propanediol  10 mg Oral Daily   ferrous sulfate  325 mg Oral BID WC   furosemide  60 mg Intravenous Q12H   [START ON 01/06/2023] furosemide  20 mg Oral Daily   gabapentin  300 mg Oral QHS   influenza vac split trivalent PF  0.5 mL Intramuscular Tomorrow-1000   losartan  25 mg Oral Daily    multivitamin with minerals  1 tablet Oral Daily   ranolazine  500 mg Oral BID   sodium chloride flush  3 mL Intravenous Q12H   sodium chloride flush  3 mL Intravenous Q12H   [START ON 01/06/2023] spironolactone  12.5 mg Oral Daily   Continuous Infusions:  sodium chloride       LOS: 4 days     Tresa Moore, MD [facility]   If 7PM-7AM, please contact night-coverage  01/05/2023, 1:51 PM

## 2023-01-05 NOTE — Progress Notes (Signed)
Pt went down to cath lab, heparin gtt stopped per cath lab RN [facility]. Informed in the report that consent needs to be redone due to use of abbreviation in the consent order. Pt also refused morning IV lasix stating he does not want to be urinating continuously right before and would like to get the lasix when he comes back.

## 2023-01-05 NOTE — Progress Notes (Signed)
[facility] HF Stewardship  PCP: Barbette Reichmann, MD  PCP-Cardiologist: None  HPI: Spencer Herring is a 67 y.o. male with CAD status post PCI, hypertension, hyperlipidemia, anxiety, angina who presented with shortness of breath, fatigue, and bilateral leg swelling. Found to be in Afib with RVR in the ED. Troponin on admission was 9. BNP on admission was 226.2.  TTE revealed LVEF 20-25% with mild-moderate MR. LHC 01/05/23 showed Prox RCA lesion is 40% stenosed, RPDA lesion is 70% stenosed, Prox RCA to Mid RCA lesion is 80% stenosed, Ost LAD lesion is 30% stenosed, 1st Diag lesion is 60% stenosed, previously placed 2nd Mrg stent of unknown type is  widely patent, previously placed Prox LAD stent of unknown type is  widely patent. DES placed in mid RCA.  Pertinent cardiac history: Positive stress test in 10/2017. Underwent cardiac cath 11/2017 and found to have RPDA 85% stenosed, Ost 1st Diag to 1st Diag 75% stenosed, prox LAD 85% stenosed, Mid Cx 75% stenosed, Dist Cx 60% stenosed, and other minor nonobstructive lesions. Underwent successful PCI with DES to proximal LAD. Cardiac cath in 01/2018 showed 90% lesion at OM2 bifurcation treated with DES. Another cardiac cath 01/2018 found 90% 2nd Mrg lesion treated with DES. CMRA in 01/2020 showed LVEF of 62%, mild tricuspid, mitral, and pulmonic regurgitation with no evidence of scarring.   Pertinent Lab Values: Creatinine  Date Value Ref Range Status  05/04/2014 0.99 0.60 - 1.30 mg/dL Final   Creatinine, Ser  Date Value Ref Range Status  01/05/2023 0.96 0.61 - 1.24 mg/dL Final   BUN  Date Value Ref Range Status  01/05/2023 30 (H) 8 - 23 mg/dL Final  86/57/8469 21 (H) 7 - 18 mg/dL Final   Potassium  Date Value Ref Range Status  01/05/2023 3.7 3.5 - 5.1 mmol/L Final  05/04/2014 5.5 (H) 3.5 - 5.1 mmol/L Final   Sodium  Date Value Ref Range Status  01/05/2023 138 135 - 145 mmol/L Final  05/04/2014 137 136 - 145 mmol/L Final   B Natriuretic Peptide   Date Value Ref Range Status  01/01/2023 226.2 (H) 0.0 - 100.0 pg/mL Final    Comment:    Performed at [facility], [address] [place], [place] [address]   Magnesium  Date Value Ref Range Status  01/04/2023 2.6 (H) 1.7 - 2.4 mg/dL Final    Comment:    Performed at [facility], [address] [place], [place] [address]  01/05/2013 1.8 mg/dL Final    Comment:    4.4-0.1 THERAPEUTIC RANGE: 4-7 mg/dL TOXIC: > 10 mg/dL  -----------------------    Hemoglobin A1C  Date Value Ref Range Status  10/15/2012 5.6 4.2 - 6.3 % Final    Comment:    The American Diabetes Association recommends that a primary goal of therapy should be <7% and that physicians should reevaluate the treatment regimen in patients with HbA1c values consistently >8%.    Thyroid Stimulating Horm  Date Value Ref Range Status  10/15/2012 1.75 uIU/mL Final    Comment:    0.45-4.50 (International Unit)  ----------------------- Pregnant patients have  different reference  ranges for TSH:  - - - - - - - - - -  Pregnant, first trimetser:  0.36 - 2.50 uIU/mL     Vital Signs: Temp:  [97.5 F (36.4 C)-98 F (36.7 C)] 97.8 F (36.6 C) (09/09 1213) Pulse Rate:  [48-98] 48 (09/09 1213) Cardiac Rhythm: Atrial fibrillation (09/09 1110) Resp:  [13-18] 18 (09/09 1213) BP: (  92-141)/(61-99) 115/88 (09/09 1213) SpO2:  [92 %-100 %] 96 % (09/09 1213) Weight:  [96.8 kg (213 lb 8 oz)] 96.8 kg (213 lb 8 oz) (09/09 1610)   Intake/Output Summary (Last 24 hours) at 01/05/2023 1239 Last data filed at 01/05/2023 0327 Gross per 24 hour  Intake 371.43 ml  Output --  Net 371.43 ml    Current Inpatient Medications:  Carvedilol 25 mg BID Losartan 25 mg daily Farxiga 10 mg daily  Prior to admission HF Medications:  Loop Diuretic: Torsemide 20 mg daily Angiotensin-Converting Enzyme Inhibitor (ACEi) or Angiotensin II Receptor Blocker (ARB): Losartan 25 mg daily  Assessment: 1. 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 NYHA class III symptoms.  -BP this AM improved to 120-140s/80s. Few low readings overnight. NSR with HR in 50s. Creatinine is stable.  -Denies SOB one room air. Weight down 3 lbs.  -Given LVEF is now severely reduced, would benefit from further GDMT titration. Can start with spironolactone 12.5 mg daily. Hypokalemic today. If BP is stable >115 mmHg on spironolactone, can consider transitioning to [place] afterwards.  Plan: 1) Medication changes recommended at this time: -Consider adding spironolactone 12.5 mg daily  2) Patient assistance: -Farxiga, Jardiance, and entresto copays are $47. Not eligible for copay cards.  3) Education: -- Patient has been educated on current HF medications and potential additions to HF medication regimen - Patient verbalizes understanding that over the next few months, these medication doses may change and more medications may be added to optimize HF regimen - Patient has been educated on basic disease state pathophysiology and goals of therapy   Medication Assistance / Insurance Benefits Check: Does the patient have prescription insurance? Prescription Insurance: Medicare  Outpatient Pharmacy: Prior to admission outpatient pharmacy: [facility]   Is the patient willing to utilize a [place] pharmacy at discharge?: Yes Thank you for involving pharmacy in this patient's care.  Enos Fling, PharmD, BCPS Phone - [phone] Clinical Pharmacist 01/05/2023 12:39 PM

## 2023-01-05 NOTE — Progress Notes (Signed)
Upon returning back to the unit, interim report received from Stormy Fabian RN and Charge [employer] regarding patient c/o chest pain. Pt reassessed and back to baseline. Pt resting comfortably in NAD. During the even, patient was placed on 2L of O2 via [place]. Pt kept on O2 for comfort for now. Pt report 0/10 pain now stating "I am felling fine now". VSS see flowsheet.

## 2023-01-05 NOTE — Significant Event (Signed)
0020 Patient c/o 7/10 stabbing chest pain to left upper chest. Primary RN off unit in CT. V/S obtained, Dr. Arville Care paged, PRN nitroglycerin given x1. Patient reports 6/10 chest pain. Dr. Arville Care gave orders to obtain an EKG, 250cc bolus, troponins x2. EKG obtained. Nitroglycerin given x1 and shortly after patient states pain is 5/10. Unable to give patient 3rd nitroglycerin d/t low BP.   0100-Bolus given  0110-Patient is asleep.

## 2023-01-05 NOTE — Progress Notes (Addendum)
[facility] CLINIC CARDIOLOGY PROGRESS NOTE   Patient ID: Spencer Herring MRN: [id number] DOB/AGE: 11/03/1955 67 y.o.  Admit date: 01/01/2023 Referring Physician Dr Londell Moh Primary Physician Dr Marcello Fennel Primary Cardiologist Dr. Erskine Emery [facility] Cardiology)  Reason for Consultation new onset atrial fibrillation RVR, acute heart failure   HPI: Spencer Herring is a 67 y.o. male with a past medical history of coronary artery disease s/p PCI to LAD 10/2017 and PCI to OM 2 01/2018, angina, hypertension, hyperlipidemia, anemia, anxiety who presented to the ED on 01/01/2023 for shortness of breath, leg swelling.  Cardiology is for further evaluation and management of new onset atrial fibrillation and acute heart failure.   Interval History:  -Patient s/p LHC this AM, feeling great. Denies any chest pain. -States SOB and LE edema are much improved. Endorses good UOP with IV diuresis. -Denies palpitations. HR much better controlled on tele, remains in atrial fibrillation.  Review of systems complete and found to be negative unless listed above    Vitals:   01/05/23 1043 01/05/23 1058 01/05/23 1113 01/05/23 1213  BP: 111/74 100/79 (!) 112/98 115/88  Pulse: 81 77 73 (!) 48  Resp: 15 13 16 18   Temp:    97.8 F (36.6 C)  TempSrc:    Oral  SpO2: 96% 92% 94% 96%  Weight:      Height:         Intake/Output Summary (Last 24 hours) at 01/05/2023 1458 Last data filed at 01/05/2023 0327 Gross per 24 hour  Intake 371.43 ml  Output --  Net 371.43 ml    PHYSICAL EXAM General: awake and alert, well nourished, in no acute distress sitting upright in hospital bed with brother present at bedside. HEENT: Normocephalic and atraumatic. Neck: No JVD.  Lungs: Normal respiratory effort on room air. Clear bilaterally to auscultation. No wheezes, crackles, rhonchi.  Heart: Irregular rhythm, rate controlled. Normal S1 and S2 without gallops or murmurs. Radial & DP pulses 2+ bilaterally. Abdomen: Non-distended  appearing.  Msk: Normal strength and tone for age. Extremities: No clubbing, cyanosis. Trace edema.   Neuro: Alert and oriented X 3. Psych: Mood appropriate, affect congruent.    LABS: Basic Metabolic Panel: Recent Labs    01/03/23 0943 01/04/23 0411 01/05/23 0619  NA 135 135 138  K 3.9 3.7 3.7  CL 97* 100 99  CO2 27 29 30   GLUCOSE 140* 90 92  BUN 31* 29* 30*  CREATININE 0.97 0.81 0.96  CALCIUM 9.3 8.9 9.1  MG 2.7* 2.6*  --    Liver Function Tests: No results for input(s): "AST", "ALT", "ALKPHOS", "BILITOT", "PROT", "ALBUMIN" in the last 72 hours. No results for input(s): "LIPASE", "AMYLASE" in the last 72 hours. CBC: Recent Labs    01/04/23 0413 01/05/23 0433  WBC 6.2 6.3  HGB 12.7* 13.4  HCT 37.7* 40.7  MCV 87.3 89.3  PLT 234 244   Cardiac Enzymes: Recent Labs    01/05/23 0041 01/05/23 0619  TROPONINIHS 6 6   BNP: No results for input(s): "BNP" in the last 72 hours.  D-Dimer: No results for input(s): "DDIMER" in the last 72 hours. Hemoglobin A1C: No results for input(s): "HGBA1C" in the last 72 hours. Fasting Lipid Panel: No results for input(s): "CHOL", "HDL", "LDLCALC", "TRIG", "CHOLHDL", "LDLDIRECT" in the last 72 hours. Thyroid Function Tests: No results for input(s): "TSH", "T4TOTAL", "T3FREE", "THYROIDAB" in the last 72 hours.  Invalid input(s): "FREET3" Anemia Panel: No results for input(s): "VITAMINB12", "FOLATE", "FERRITIN", "TIBC", "IRON", "RETICCTPCT"  in the last 72 hours.  CARDIAC CATHETERIZATION  Result Date: 01/05/2023   Prox RCA lesion is 40% stenosed.   RPDA lesion is 70% stenosed.   Prox RCA to Mid RCA lesion is 80% stenosed.   Ost LAD lesion is 30% stenosed.   1st Diag lesion is 60% stenosed.   Previously placed 2nd Mrg stent of unknown type is  widely patent.   Previously placed Prox LAD stent of unknown type is  widely patent.   A drug-eluting stent was successfully placed using a STENT ONYX FRONTIER 3.0X22.   Post intervention,  there is a 0% residual stenosis.   There is moderate to severe left ventricular systolic dysfunction.   The left ventricular ejection fraction is 25-35% by visual estimate. 1.  One-vessel CAD with patent stent proximal LAD, patent stent OM 2, discrete 85% stenosis mid RCA 2.  Dilated cardiomyopathy with moderate to severely reduced left ventricular function, with estimated LV ejection fraction 25-30% 3.  Successful PCI with 3.0 x 22 mm Onyx Frontier DES mid RCA Recommendations 1.  Triple therapy with low-dose aspirin, clopidogrel, and Eliquis for 2-3 weeks then DC aspirin 2.  Good medical management for dilated cardiomyopathy and acute on chronic HFrEF 3.  Referral to EP for atrial fibrillation and likely ICD     ECHO 01/02/23:  1. Left ventricular ejection fraction, by estimation, is 20 to 25%. The left ventricle has severely decreased function. The left ventricle demonstrates global hypokinesis. There is mild left ventricular hypertrophy. Left ventricular diastolic parameters are indeterminate.   2. Right ventricular systolic function is normal. The right ventricular  size is mildly enlarged.   3. Left atrial size was moderately dilated.   4. The mitral valve is normal in structure. Mild to moderate mitral valve  regurgitation.   5. The aortic valve was not well visualized. Aortic valve regurgitation  is not visualized. Aortic valve sclerosis is present, with no evidence of  aortic valve stenosis.   TELEMETRY reviewed by me 01/05/23: atrial fibrillation rate 70-80s  DATA reviewed by me 01/05/23:  last 24h vitals tele labs imaging I/O hospitalist progress note   Principal Problem:   Acute exacerbation of CHF (congestive heart failure) (HCC) Active Problems:   CAD (coronary artery disease)   Morbid obesity (HCC)   Bilateral lower extremity edema   Iron deficiency anemia following bariatric surgery   Hyperlipidemia   ASSESSMENT AND PLAN: Spencer Herring is a 67 y.o. male  with a past  medical history of coronary artery disease s/p PCI to LAD 10/2017 and PCI to OM 2 01/2018, angina, hypertension, hyperlipidemia, anemia, anxiety who presented to the ED on 01/01/2023 for shortness of breath, leg swelling.  Cardiology is for further evaluation and management of new onset atrial fibrillation and acute heart failure.    # Acute heart failure reduced EF # Shortness of breath  # Bilateral lower extremity edema Patient with worsening shortness of breath and lower extremity edema over the last week.  BNP in the ED 226.  Prior cardiac MRI 2021 with EF 62% -Echo shows newly reduced LVEF 20-25% -Continue losartan 25 mg daily -Continue carvedilol 25 mg twice daily -Continue farxiga 10 mg daily -Continue Lasix 60 mg IV for one more dose today. Will plan to transition to po lasix 20 mg tomorrow.  -Start spironolactone 12.5 mg daily tomorrow. -Patient would like to establish care with our office as he lives in [place] and it is easier for him to come here than [facility].  -  Heart failure clinic appointment 9/17 at 12:30 PM [facility] Cardiology with Dr. Clarnce Flock.  [facility] follow up appointment with Dr. Melton Alar in 2 weeks. -Will set patient up for EP evaluation for consideration of ICD at follow up appointment.    # New onset atrial fibrillation RVR  Patient presenting to the ED with shortness of breath found to be in new onset atrial fibrillation on EKG with rate in the 130s. -Carvedilol 25 mg bid for rate control -Eliquis 5 mg BID for stroke risk reduction.    #Unstable angina  #Coronary artery disease s/p PCI to LAD and OM 2 in 2019 # Hyperlipidemia Patient underwent LHC this AM and received 3.0 x 22 mm Onyx Frontier DES to mid RCA. -Plan for triple therapy (aspirin, plavix, eliquis) for 2-3 weeks then will discontinue aspirin. -increase atorvastatin to 80 mg daily -Continue home Ranexa 500 mg twice daily  This patient's plan of care was discussed and created with Dr. Melton Alar and she is in  agreement.    Signed: Gale Journey, PA-C 01/05/2023, 2:58 PM [facility] Cardiology

## 2023-01-05 NOTE — Progress Notes (Signed)
Secure chat with Dr. Georgeann Oppenheim and notified him that the right radial that patient had his heart cath procedure on was bleeding and the hand was cold and the arm was cool up to the shoulder. I notified him that I had been doing pulse checks since patient had returned and had been getting +2 pulse checks and even brought the charge nurse in to check behind me and she verified with Dr. Georgeann Oppenheim that she also got a +2 pulse on the patient but also felt the hand and arm and it was cool to the touch. I was concerned about the bleeding at the radial site so a pressure bandage was applied which I informed Dr. Georgeann Oppenheim about as well. He advised me to continue with the pressure bandage and pulse checks. He has also ordered an ultrasound to be done on the right radial. I will continue to monitor patient.

## 2023-01-06 ENCOUNTER — Other Ambulatory Visit: Payer: Self-pay

## 2023-01-06 ENCOUNTER — Encounter: Payer: Self-pay | Admitting: Cardiology

## 2023-01-06 DIAGNOSIS — I5023 Acute on chronic systolic (congestive) heart failure: Secondary | ICD-10-CM | POA: Diagnosis not present

## 2023-01-06 LAB — CBC
HCT: 39.7 % (ref 39.0–52.0)
Hemoglobin: 13.5 g/dL (ref 13.0–17.0)
MCH: 29.6 pg (ref 26.0–34.0)
MCHC: 34 g/dL (ref 30.0–36.0)
MCV: 87.1 fL (ref 80.0–100.0)
Platelets: 232 10*3/uL (ref 150–400)
RBC: 4.56 MIL/uL (ref 4.22–5.81)
RDW: 13.6 % (ref 11.5–15.5)
WBC: 6.1 10*3/uL (ref 4.0–10.5)
nRBC: 0 % (ref 0.0–0.2)

## 2023-01-06 LAB — BASIC METABOLIC PANEL
Anion gap: 8 (ref 5–15)
BUN: 26 mg/dL — ABNORMAL HIGH (ref 8–23)
CO2: 30 mmol/L (ref 22–32)
Calcium: 9.4 mg/dL (ref 8.9–10.3)
Chloride: 100 mmol/L (ref 98–111)
Creatinine, Ser: 0.89 mg/dL (ref 0.61–1.24)
GFR, Estimated: >60 mL/min (ref >60)
Glucose, Bld: 87 mg/dL (ref 70–99)
Potassium: 4.1 mmol/L (ref 3.5–5.1)
Sodium: 138 mmol/L (ref 135–145)

## 2023-01-06 MED ORDER — ATORVASTATIN CALCIUM 80 MG PO TABS: 40.0000 mg | ORAL_TABLET | Freq: Every day | ORAL | 1 refills | Status: AC

## 2023-01-06 MED ORDER — CLOPIDOGREL BISULFATE 75 MG PO TABS: 75.0000 mg | ORAL_TABLET | Freq: Every day | ORAL | 1 refills | Status: AC

## 2023-01-06 MED ORDER — APIXABAN 5 MG PO TABS: 5.0000 mg | ORAL_TABLET | Freq: Two times a day (BID) | ORAL | 1 refills | Status: AC

## 2023-01-06 MED ORDER — FUROSEMIDE 20 MG PO TABS: 20.0000 mg | ORAL_TABLET | Freq: Every day | ORAL | 1 refills | Status: AC

## 2023-01-06 MED ORDER — SPIRONOLACTONE 25 MG PO TABS: 12.5000 mg | ORAL_TABLET | Freq: Every day | ORAL | 1 refills | Status: AC

## 2023-01-06 MED ORDER — DAPAGLIFLOZIN PROPANEDIOL 10 MG PO TABS: 10.0000 mg | ORAL_TABLET | Freq: Every day | ORAL | 1 refills | Status: AC

## 2023-01-06 MED ORDER — CARVEDILOL 25 MG PO TABS: 25.0000 mg | ORAL_TABLET | Freq: Two times a day (BID) | ORAL | 1 refills | Status: AC

## 2023-01-06 NOTE — Progress Notes (Addendum)
[facility] CLINIC CARDIOLOGY PROGRESS NOTE   Patient ID: Spencer Herring MRN: [id number] DOB/AGE: 08/19/1955 67 y.o.  Admit date: 01/01/2023 Referring Physician Dr Londell Moh Primary Physician Dr Marcello Fennel Primary Cardiologist Dr. Erskine Emery [facility] Cardiology)  Reason for Consultation new onset atrial fibrillation RVR, acute heart failure   HPI: Spencer Herring Magda is a 67 y.o. male with a past medical history of coronary artery disease s/p PCI to LAD 10/2017 and PCI to OM 2 01/2018, angina, hypertension, hyperlipidemia, anemia, anxiety who presented to the ED on 01/01/2023 for shortness of breath, leg swelling.  Cardiology is for further evaluation and management of new onset atrial fibrillation and acute heart failure.   Interval History:  -Patient feeling much improved this AM. Denies chest pain, palpitations.  -SOB improved overall. Leg swelling resolved. Patient continues to report good UOP.  -HR controlled. BP tolerating meds well. He denies dizziness, lightheadedness.  Review of systems complete and found to be negative unless listed above    Vitals:   01/05/23 2033 01/05/23 2351 01/06/23 0314 01/06/23 0732  BP:  103/85 97/78 116/87  Pulse:  68 82 73  Resp:  18 18 19   Temp:  (!) 97.4 F (36.3 C) (!) 97.5 F (36.4 C) (!) 97.4 F (36.3 C)  TempSrc:      SpO2: 97% 97% 97% 99%  Weight:      Height:         Intake/Output Summary (Last 24 hours) at 01/06/2023 6962 Last data filed at 01/05/2023 1705 Gross per 24 hour  Intake 240 ml  Output 400 ml  Net -160 ml    PHYSICAL EXAM General: awake and alert, well nourished, in no acute distress sitting upright in bedside chair. HEENT: Normocephalic and atraumatic. Neck: No JVD.  Lungs: Normal respiratory effort on room air. Clear bilaterally to auscultation. No wheezes, crackles, rhonchi.  Heart: Irregular rhythm, rate controlled. Normal S1 and S2 without gallops or murmurs. Radial & DP pulses 2+ bilaterally. Abdomen: Non-distended appearing.   Msk: Normal strength and tone for age. Extremities: No clubbing, cyanosis. Trace edema. R radial access site with bruising but no pain, hematoma, or evidence of active bleeding. Neuro: Alert and oriented X 3. Psych: Mood appropriate, affect congruent.    LABS: Basic Metabolic Panel: Recent Labs    01/03/23 0943 01/04/23 0411 01/05/23 0619 01/06/23 0330  NA 135 135 138 138  K 3.9 3.7 3.7 4.1  CL 97* 100 99 100  CO2 27 29 30 30   GLUCOSE 140* 90 92 87  BUN 31* 29* 30* 26*  CREATININE 0.97 0.81 0.96 0.89  CALCIUM 9.3 8.9 9.1 9.4  MG 2.7* 2.6*  --   --    Liver Function Tests: No results for input(s): "AST", "ALT", "ALKPHOS", "BILITOT", "PROT", "ALBUMIN" in the last 72 hours. No results for input(s): "LIPASE", "AMYLASE" in the last 72 hours. CBC: Recent Labs    01/05/23 0433 01/06/23 0330  WBC 6.3 6.1  HGB 13.4 13.5  HCT 40.7 39.7  MCV 89.3 87.1  PLT 244 232   Cardiac Enzymes: Recent Labs    01/05/23 0041 01/05/23 0619  TROPONINIHS 6 6   BNP: No results for input(s): "BNP" in the last 72 hours.  D-Dimer: No results for input(s): "DDIMER" in the last 72 hours. Hemoglobin A1C: No results for input(s): "HGBA1C" in the last 72 hours. Fasting Lipid Panel: No results for input(s): "CHOL", "HDL", "LDLCALC", "TRIG", "CHOLHDL", "LDLDIRECT" in the last 72 hours. Thyroid Function Tests: No results for  input(s): "TSH", "T4TOTAL", "T3FREE", "THYROIDAB" in the last 72 hours.  Invalid input(s): "FREET3" Anemia Panel: No results for input(s): "VITAMINB12", "FOLATE", "FERRITIN", "TIBC", "IRON", "RETICCTPCT" in the last 72 hours.  [place] UPPER EXTREMITY DUPLEX RIGHT (NON-WBI)  Result Date: 01/05/2023 CLINICAL DATA:  Right upper extremity bleeding following arterial catheterization EXAM: RIGHT UPPER EXTREMITY ARTERIAL DUPLEX SCAN TECHNIQUE: Gray-scale sonography as well as color Doppler and duplex ultrasound was performed to evaluate the arteries of the upper extremity.  COMPARISON:  None Available. FINDINGS: The right subclavian, axillary, brachial, radial, and ulnar arteries are all patent demonstrate appropriate triphasic arterial waveforms. Peak velocities within the distal right ulnar artery are marginal at 9 centimeters/second. Remaining velocities within normal limits. The arterial waveform is irregular suggesting underlying arrhythmia such as atrial fibrillation. IMPRESSION: 1. No evidence of hemodynamically significant arterial stenosis or occlusion within the right upper extremity large vessel arterial vasculature. 2. Margin peak systolic velocities within the distal right ulnar artery may relate to limited runoff. This is not well assessed on this examination and may be better assessed with formal arteriography if indicated. 3. Irregular arterial waveform suggesting underlying arrhythmia such as atrial fibrillation. Electronically Signed   By: Helyn Numbers M.D.   On: 01/05/2023 21:28   CARDIAC CATHETERIZATION  Result Date: 01/05/2023   Prox RCA lesion is 40% stenosed.   RPDA lesion is 70% stenosed.   Prox RCA to Mid RCA lesion is 80% stenosed.   Ost LAD lesion is 30% stenosed.   1st Diag lesion is 60% stenosed.   Previously placed 2nd Mrg stent of unknown type is  widely patent.   Previously placed Prox LAD stent of unknown type is  widely patent.   A drug-eluting stent was successfully placed using a STENT ONYX FRONTIER 3.0X22.   Post intervention, there is a 0% residual stenosis.   There is moderate to severe left ventricular systolic dysfunction.   The left ventricular ejection fraction is 25-35% by visual estimate. 1.  One-vessel CAD with patent stent proximal LAD, patent stent OM 2, discrete 85% stenosis mid RCA 2.  Dilated cardiomyopathy with moderate to severely reduced left ventricular function, with estimated LV ejection fraction 25-30% 3.  Successful PCI with 3.0 x 22 mm Onyx Frontier DES mid RCA Recommendations 1.  Triple therapy with low-dose aspirin,  clopidogrel, and Eliquis for 2-3 weeks then DC aspirin 2.  Good medical management for dilated cardiomyopathy and acute on chronic HFrEF 3.  Referral to EP for atrial fibrillation and likely ICD     ECHO 01/02/23:  1. Left ventricular ejection fraction, by estimation, is 20 to 25%. The left ventricle has severely decreased function. The left ventricle demonstrates global hypokinesis. There is mild left ventricular hypertrophy. Left ventricular diastolic parameters are indeterminate.   2. Right ventricular systolic function is normal. The right ventricular  size is mildly enlarged.   3. Left atrial size was moderately dilated.   4. The mitral valve is normal in structure. Mild to moderate mitral valve  regurgitation.   5. The aortic valve was not well visualized. Aortic valve regurgitation  is not visualized. Aortic valve sclerosis is present, with no evidence of  aortic valve stenosis.   TELEMETRY reviewed by me 01/06/23: atrial fibrillation rate 90s  DATA reviewed by me 01/06/23:  last 24h vitals tele labs imaging I/O hospitalist progress note   Principal Problem:   Acute exacerbation of CHF (congestive heart failure) (HCC) Active Problems:   CAD (coronary artery disease)   Morbid obesity (HCC)  Bilateral lower extremity edema   Iron deficiency anemia following bariatric surgery   Hyperlipidemia   ASSESSMENT AND PLAN: Rabih Pasqual Penrose is a 67 y.o. male  with a past medical history of coronary artery disease s/p PCI to LAD 10/2017 and PCI to OM 2 01/2018, angina, hypertension, hyperlipidemia, anemia, anxiety who presented to the ED on 01/01/2023 for shortness of breath, leg swelling.  Cardiology is for further evaluation and management of new onset atrial fibrillation and acute heart failure.    # Acute heart failure reduced EF # Shortness of breath  # Bilateral lower extremity edema Patient with worsening shortness of breath and lower extremity edema over the last week.  BNP in the ED  226.  Prior cardiac MRI 2021 with EF 62% -Echo shows newly reduced LVEF 20-25% -Continue losartan 25 mg daily, spironolactone 12.5 mg daily, carvedilol 25 mg twice daily, farxiga 10 mg daily.  -Start lasix 20 mg daily.  -Patient would like to establish care with our office as he lives in [place] and it is easier for him to come here than [facility].  -Heart failure clinic appointment 9/17 at 12:30 PM [facility] Cardiology with Dr. Clarnce Flock.  [facility] follow up appointment with Dr. Melton Alar in 2 weeks. -Indications and risks/benefits of LifeVest discussed, patient declines.  -Will set patient up for EP evaluation for consideration of ICD at follow up appointment.    # New onset atrial fibrillation RVR  Patient presenting to the ED with shortness of breath found to be in new onset atrial fibrillation on EKG with rate in the 130s. -Carvedilol 25 mg bid for rate control -Eliquis 5 mg BID for stroke risk reduction.    #Unstable angina  #Coronary artery disease s/p PCI to LAD and OM 2 in 2019 # Hyperlipidemia Patient underwent LHC 01/05/2023 and received 3.0 x 22 mm Onyx Frontier DES to mid RCA. -Plan for triple therapy (aspirin, plavix, eliquis) for 2-3 weeks then will discontinue aspirin. -atorvastatin 80 mg daily -Continue home Ranexa 500 mg twice daily  Patient is stable for discharge today from cardiac perspective, will follow up with heart failure clinic next week in our office.   This patient's plan of care was discussed and created with Dr. Juliann Pares and he is in agreement.    Signed: Gale Journey, PA-C 01/06/2023, 9:24 AM [facility] Cardiology

## 2023-01-06 NOTE — Progress Notes (Signed)
Transition of Care [facility]) - Inpatient Brief Assessment   Patient Details  Name: KENTAVIUS HOEFLING MRN: [id number] Date of Birth: Jul 25, 1955  [facility] CM/SW Contact:    Truddie Hidden, RN Phone Number: 01/06/2023, 9:29 AM   Clinical Narrative: TOC continuing to follow patient's progress throughout discharge planning.   Transition of Care Asessment: Insurance and Status: Insurance coverage has been reviewed Patient has primary care physician: Yes Home environment has been reviewed: home Prior level of function:: independent Prior/Current Home Services: No current home services Social Determinants of Health Reivew: SDOH reviewed no interventions necessary Readmission risk has been reviewed: Yes Transition of care needs: no transition of care needs at this time

## 2023-01-06 NOTE — Discharge Summary (Signed)
Physician Discharge Summary  Spencer Herring GEX:[id number] DOB: 1956-03-23 DOA: 01/01/2023  PCP: Barbette Reichmann, MD  Admit date: 01/01/2023 Discharge date: 01/06/2023  Admitted From: Home Disposition:  home  Recommendations for Outpatient Follow-up:  Follow up with PCP in 1-2 weeks Follow-up with cardiology 2 weeks  Home Health: No Equipment/Devices: None  Discharge Condition: Stable CODE STATUS: Full Diet recommendation: Heart healthy  Brief/Interim Summary:  67 year old male with history of CAD status post PCI, hypertension, hyperlipidemia, anxiety, angina, who presents to the emergency department for chief concerns of shortness of breath and fatigue with bilateral leg swelling.He reports shortness of breath and chest pain worse with exertion. He denies current chest pain and shortness of breath currently at bedside. He endorses fatigue. These symptoms improve with rest.  He denies trauma to his person, including chest area. He endorses compliance with home medications.  He endorses associated bilateral lower extremity edema.  He endorses feelings of fever, with tmax of 100 at home, chills, and nausea. He did not take anything at home for these symptoms. He denies vomiting.    9/6: Seen in consultation by cardiology.  Recommendations appreciated.  Left ventricular ejection fraction markedly reduced on direct visualization by cardiologist.  Patient started on Lasix 60 mg IV twice daily.  Cardizem discontinued.  Contraindicated the setting of reduced ejection fraction.   9/8: No acute events 9/9: Left heart catheterization today.  Status post PCI with DES to mid RCA.  Tolerated procedure well.  No chest pain or shortness of breath 9/10: Patient had some right hand numbness and nurse noted hand to be cool to touch post catheterization.  Stat arterial ultrasound performed and reviewed.  No arterial clot nor vessel damage identified.  Symptoms had improved on day of discharge.  Stable for  DC home at this time.  Appreciate assistance from cardiology team     Discharge Diagnoses:  Principal Problem:   Acute exacerbation of CHF (congestive heart failure) (HCC) Active Problems:   CAD (coronary artery disease)   Morbid obesity (HCC)   Bilateral lower extremity edema   Iron deficiency anemia following bariatric surgery   Hyperlipidemia Acute exacerbation of systolic congestive heart failure Left-ventricular ejection fraction markedly reduced on direct visualization by cardiologist.  Formal evaluation and read is pending.  Received 20 mg IV Lasix in ED. Plan: Stable for discharge home.  Recommend Lasix 20 mg p.o. daily.  Farxiga 10 mg daily.  Goal-directed medical therapy as below.  Stable for discharge home.  New onset atrial fibrillation with rapid ventricular response Patient started on Cardizem gtt.  Now discontinued and will avoid diltiazem given reduced ejection fraction.  Rate control improved Plan: Continue Coreg 25 mg p.o. twice daily Eliquis 5 mg p.o. twice daily Follow-up outpatient cardiology  CAD (coronary artery disease) Status post left heart catheterization with PCI to DES on 9/9 Plan: Coreg 25 mg twice daily Losartan 25 mg daily Eliquis 5 mg twice daily Clopidogrel 75 mg daily Nitro sublingual as needed Ranexa 500 mg twice daily  Hyperlipidemia Statin   Iron deficiency anemia following bariatric surgery Home iron replacement   Morbid obesity (HCC) This meets criteria for morbid obesity based on the presence of 1 or more chronic comorbidities. Patient has BMI of 38.28 and CAD. This complicates overall care and prognosis.      Discharge Instructions  Discharge Instructions     AMB Referral to Cardiac Rehabilitation - Phase II   Complete by: As directed    Diagnosis: Coronary Stents  After initial evaluation and assessments completed: Virtual Based Care may be provided alone or in conjunction with Phase 2 Cardiac Rehab based on patient  barriers.: Yes   Intensive Cardiac Rehabilitation (ICR) [facility] location only OR Traditional Cardiac Rehabilitation (TCR) *If criteria for ICR are not met will enroll in TCR [facility] only): Yes   Diet - low sodium heart healthy   Complete by: As directed    Increase activity slowly   Complete by: As directed       Allergies as of 01/06/2023       Reactions   Latex Rash        Medication List     STOP taking these medications    amLODipine 2.5 MG tablet Commonly known as: NORVASC   cyclobenzaprine 5 MG tablet Commonly known as: FLEXERIL   DULoxetine 30 MG capsule Commonly known as: CYMBALTA   isosorbide mononitrate 30 MG 24 hr tablet Commonly known as: IMDUR   metoprolol succinate 25 MG 24 hr tablet Commonly known as: TOPROL-XL   oxyCODONE 5 MG immediate release tablet Commonly known as: Oxy IR/ROXICODONE   pantoprazole 40 MG tablet Commonly known as: PROTONIX   potassium chloride 10 MEQ tablet Commonly known as: KLOR-CON M   tamsulosin 0.4 MG Caps capsule Commonly known as: FLOMAX   telmisartan 20 MG tablet Commonly known as: MICARDIS   torsemide 20 MG tablet Commonly known as: DEMADEX   vitamin E 180 MG (400 UNITS) capsule       TAKE these medications    albuterol 0.63 MG/3ML nebulizer solution Commonly known as: ACCUNEB Take 1 ampule by nebulization every 4 (four) hours as needed for wheezing.   albuterol 108 (90 Base) MCG/ACT inhaler Commonly known as: VENTOLIN HFA Ventolin HFA 90 mcg/actuation aerosol inhaler  INHALE 2 INHALATIONS INTO THE LUNGS EVERY 4 (FOUR) HOURS AS NEEDED FOR WHEEZING   ALPRAZolam 0.25 MG tablet Commonly known as: XANAX Take 0.25 mg by mouth at bedtime as needed for anxiety.   apixaban 5 MG Tabs tablet Commonly known as: ELIQUIS Take 1 tablet (5 mg total) by mouth 2 (two) times daily.   ascorbic acid 250 MG tablet Commonly known as: VITAMIN C Take 250 mg by mouth daily.   aspirin 81 MG chewable tablet Chew 1 tablet  (81 mg total) by mouth daily.   atorvastatin 80 MG tablet Commonly known as: LIPITOR Take 0.5 tablets (40 mg total) by mouth daily. What changed: medication strength   budesonide-formoterol 160-4.5 MCG/ACT inhaler Commonly known as: SYMBICORT Inhale 2 puffs into the lungs daily as needed (SHORTNESS OF BREATH).   carvedilol 25 MG tablet Commonly known as: COREG Take 1 tablet (25 mg total) by mouth 2 (two) times daily with a meal. What changed:  medication strength how much to take when to take this   clopidogrel 75 MG tablet Commonly known as: PLAVIX Take 1 tablet (75 mg total) by mouth daily with breakfast.   dapagliflozin propanediol 10 MG Tabs tablet Commonly known as: FARXIGA Take 1 tablet (10 mg total) by mouth daily.   ferrous sulfate 325 (65 FE) MG tablet Take 325 mg by mouth 2 (two) times daily with a meal.   furosemide 20 MG tablet Commonly known as: LASIX Take 1 tablet (20 mg total) by mouth daily.   gabapentin 300 MG capsule Commonly known as: NEURONTIN Take 300 mg by mouth at bedtime.   losartan 25 MG tablet Commonly known as: COZAAR Take 1 tablet by mouth daily.   multivitamin tablet  Take 1 tablet by mouth daily.   nitroGLYCERIN 0.4 MG SL tablet Commonly known as: NITROSTAT Place 0.4 mg under the tongue every 5 (five) minutes as needed.   oxyCODONE-acetaminophen 7.5-325 MG tablet Commonly known as: PERCOCET Take 1 tablet by mouth 2 (two) times daily as needed for severe pain.   ranolazine 500 MG 12 hr tablet Commonly known as: RANEXA Take 500 mg by mouth 2 (two) times daily.   spironolactone 25 MG tablet Commonly known as: ALDACTONE Take 0.5 tablets (12.5 mg total) by mouth daily.   tiZANidine 2 MG tablet Commonly known as: ZANAFLEX Take 2 mg by mouth every 6 (six) hours as needed for muscle spasms.   traMADol 50 MG tablet Commonly known as: ULTRAM Take 50 mg by mouth every 12 (twelve) hours as needed for moderate pain.         Follow-up Information     Custovic, Rozell Searing, DO. Go in 2 week(s).   Specialty: Cardiology Why: Patient also has scheduled heart failure clinic appointment on 9/17 at 12:30 PM with Dr. Clarnce Flock at [facility]. Contact information: [address] [place] [address] [phone]                Allergies  Allergen Reactions   Latex Rash    Consultations: Cardiology-[facility]   Procedures/Studies: [place] UPPER EXTREMITY DUPLEX RIGHT (NON-WBI)  Result Date: 01/05/2023 CLINICAL DATA:  Right upper extremity bleeding following arterial catheterization EXAM: RIGHT UPPER EXTREMITY ARTERIAL DUPLEX SCAN TECHNIQUE: Gray-scale sonography as well as color Doppler and duplex ultrasound was performed to evaluate the arteries of the upper extremity. COMPARISON:  None Available. FINDINGS: The right subclavian, axillary, brachial, radial, and ulnar arteries are all patent demonstrate appropriate triphasic arterial waveforms. Peak velocities within the distal right ulnar artery are marginal at 9 centimeters/second. Remaining velocities within normal limits. The arterial waveform is irregular suggesting underlying arrhythmia such as atrial fibrillation. IMPRESSION: 1. No evidence of hemodynamically significant arterial stenosis or occlusion within the right upper extremity large vessel arterial vasculature. 2. Margin peak systolic velocities within the distal right ulnar artery may relate to limited runoff. This is not well assessed on this examination and may be better assessed with formal arteriography if indicated. 3. Irregular arterial waveform suggesting underlying arrhythmia such as atrial fibrillation. Electronically Signed   By: Helyn Numbers M.D.   On: 01/05/2023 21:28   CARDIAC CATHETERIZATION  Result Date: 01/05/2023   Prox RCA lesion is 40% stenosed.   RPDA lesion is 70% stenosed.   Prox RCA to Mid RCA lesion is 80% stenosed.   Ost LAD lesion is 30% stenosed.   1st Diag lesion is 60%  stenosed.   Previously placed 2nd Mrg stent of unknown type is  widely patent.   Previously placed Prox LAD stent of unknown type is  widely patent.   A drug-eluting stent was successfully placed using a STENT ONYX FRONTIER [id number].   Post intervention, there is a 0% residual stenosis.   There is moderate to severe left ventricular systolic dysfunction.   The left ventricular ejection fraction is 25-35% by visual estimate. 1.  One-vessel CAD with patent stent proximal LAD, patent stent OM 2, discrete 85% stenosis mid RCA 2.  Dilated cardiomyopathy with moderate to severely reduced left ventricular function, with estimated LV ejection fraction 25-30% 3.  Successful PCI with 3.0 x 22 mm Onyx Frontier DES mid RCA Recommendations 1.  Triple therapy with low-dose aspirin, clopidogrel, and Eliquis for 2-3 weeks then DC aspirin  2.  Good medical management for dilated cardiomyopathy and acute on chronic HFrEF 3.  Referral to EP for atrial fibrillation and likely ICD   ECHOCARDIOGRAM COMPLETE  Result Date: 01/02/2023    ECHOCARDIOGRAM REPORT   Patient Name:   Spencer Herring Date of Exam: 01/02/2023 Medical Rec #:  [id number]       Height:       64.0 in Accession #:    [id number]      Weight:       223.0 lb Date of Birth:  [birth year]       BSA:          2.049 m Patient Age:    66 years        BP:           126/88 mmHg Patient Gender: M               HR:           119 bpm. Exam Location:  [facility] Procedure: 2D Echo, Cardiac Doppler, Color Doppler and Intracardiac            Opacification Agent Indications:     CHF, Dyspnea  History:         Patient has no prior history of Echocardiogram examinations.                  CHF, CAD, Arrythmias:LBBB; Signs/Symptoms:Dyspnea.  Sonographer:     Mikki Harbor Referring Phys:  [id number] AMY N COX Diagnosing Phys: Debbe Odea MD  Sonographer Comments: Patient is obese. IMPRESSIONS  1. Left ventricular ejection fraction, by estimation, is 20 to 25%. The left ventricle has severely  decreased function. The left ventricle demonstrates global hypokinesis. There is mild left ventricular hypertrophy. Left ventricular diastolic parameters  are indeterminate.  2. Right ventricular systolic function is normal. The right ventricular size is mildly enlarged.  3. Left atrial size was moderately dilated.  4. The mitral valve is normal in structure. Mild to moderate mitral valve regurgitation.  5. The aortic valve was not well visualized. Aortic valve regurgitation is not visualized. Aortic valve sclerosis is present, with no evidence of aortic valve stenosis. FINDINGS  Left Ventricle: Left ventricular ejection fraction, by estimation, is 20 to 25%. The left ventricle has severely decreased function. The left ventricle demonstrates global hypokinesis. Definity contrast agent was given IV to delineate the left ventricular endocardial borders. The left ventricular internal cavity size was normal in size. There is mild left ventricular hypertrophy. Left ventricular diastolic parameters are indeterminate. Right Ventricle: The right ventricular size is mildly enlarged. No increase in right ventricular wall thickness. Right ventricular systolic function is normal. Left Atrium: Left atrial size was moderately dilated. Right Atrium: Right atrial size was normal in size. Pericardium: There is no evidence of pericardial effusion. Mitral Valve: The mitral valve is normal in structure. Mild to moderate mitral valve regurgitation. MV peak gradient, 4.5 mmHg. The mean mitral valve gradient is 2.0 mmHg. Tricuspid Valve: The tricuspid valve is normal in structure. Tricuspid valve regurgitation is mild. Aortic Valve: The aortic valve was not well visualized. Aortic valve regurgitation is not visualized. Aortic valve sclerosis is present, with no evidence of aortic valve stenosis. Aortic valve mean gradient measures 4.0 mmHg. Aortic valve peak gradient measures 8.0 mmHg. Aortic valve area, by VTI measures 2.53 cm. Pulmonic  Valve: The pulmonic valve was normal in structure. Pulmonic valve regurgitation is mild. Aorta: The aortic root is normal in size and structure. Venous: The  inferior vena cava was not well visualized. IAS/Shunts: No atrial level shunt detected by color flow Doppler.  LEFT VENTRICLE PLAX 2D LVIDd:         5.30 cm LVIDs:         4.90 cm LV PW:         1.30 cm LV IVS:        1.20 cm LVOT diam:     2.10 cm LV SV:         65 LV SV Index:   32 LVOT Area:     3.46 cm  RIGHT VENTRICLE RV Basal diam:  4.00 cm RV Mid diam:    3.30 cm RV S prime:     12.10 cm/s LEFT ATRIUM              Index        RIGHT ATRIUM           Index LA diam:        4.50 cm  2.20 cm/m   RA Area:     21.30 cm LA Vol (A2C):   114.0 ml 55.64 ml/m  RA Volume:   68.60 ml  33.48 ml/m LA Vol (A4C):   94.8 ml  46.27 ml/m LA Biplane Vol: 105.0 ml 51.24 ml/m  AORTIC VALVE                    PULMONIC VALVE AV Area (Vmax):    2.42 cm     PV Vmax:       1.13 m/s AV Area (Vmean):   2.43 cm     PV Peak grad:  5.1 mmHg AV Area (VTI):     2.53 cm AV Vmax:           141.50 cm/s AV Vmean:          87.500 cm/s AV VTI:            0.255 m AV Peak Grad:      8.0 mmHg AV Mean Grad:      4.0 mmHg LVOT Vmax:         98.80 cm/s LVOT Vmean:        61.450 cm/s LVOT VTI:          0.186 m LVOT/AV VTI ratio: 0.73  AORTA Ao Root diam: 3.70 cm Ao Asc diam:  3.50 cm MITRAL VALVE               TRICUSPID VALVE MV Area (PHT): 6.71 cm    TR Peak grad:   15.2 mmHg MV Area VTI:   3.51 cm    TR Vmax:        195.00 cm/s MV Peak grad:  4.5 mmHg MV Mean grad:  2.0 mmHg    SHUNTS MV Vmax:       1.06 m/s    Systemic VTI:  0.19 m MV Vmean:      61.7 cm/s   Systemic Diam: 2.10 cm MV Decel Time: 113 msec MV E velocity: 80.20 cm/s Debbe Odea MD Electronically signed by Debbe Odea MD Signature Date/Time: 01/02/2023/2:14:20 PM    Final    US Venous Img Lower Bilateral (DVT)  Result Date: 01/01/2023 CLINICAL DATA:  Leg swelling. EXAM: Bilateral LOWER EXTREMITY VENOUS DOPPLER  ULTRASOUND TECHNIQUE: Gray-scale sonography with compression, as well as color and duplex ultrasound, were performed to evaluate the deep venous system(s) from the level of the common femoral vein through the popliteal and proximal calf veins.  COMPARISON:  None Available. FINDINGS: VENOUS Normal compressibility of the common femoral, superficial femoral, and popliteal veins, as well as the visualized calf veins. Visualized portions of profunda femoral vein and great saphenous vein unremarkable. No filling defects to suggest DVT on grayscale or color Doppler imaging. Doppler waveforms show normal direction of venous flow, normal respiratory plasticity and response to augmentation. Limited views of the contralateral common femoral vein are unremarkable. OTHER None. Limitations: none IMPRESSION: No evidence of bilateral lower extremity DVT Electronically Signed   By: Karen Kays M.D.   On: 01/01/2023 18:41   DG Chest Portable 1 View  Result Date: 01/01/2023 CLINICAL DATA:  Atrial fibrillation with shortness of breath. EXAM: PORTABLE CHEST 1 VIEW COMPARISON:  October 02, 2022. FINDINGS: The cardiac silhouette is mildly enlarged and unchanged in size. There is moderate severity calcification of the aortic arch. Mild, diffuse, chronic appearing increased interstitial lung markings are noted. There is mild, predominately perihilar prominence of the pulmonary vasculature. Mild atelectasis is seen within the bilateral lung bases. No pleural effusion or pneumothorax is identified. Multilevel degenerative changes are noted throughout the thoracic spine. IMPRESSION: 1. Mild cardiomegaly with mild pulmonary vascular congestion. 2. Mild bibasilar atelectasis. Electronically Signed   By: Aram Candela M.D.   On: 01/01/2023 15:15      Subjective: Seen and examined on the day of discharge.  Stable no distress.  Appropriate discharge home.  Discharge Exam: Vitals:   01/06/23 0314 01/06/23 0732  BP: 97/78 116/87  Pulse:  82 73  Resp: 18 19  Temp: (!) 97.5 F (36.4 C) (!) 97.4 F (36.3 C)  SpO2: 97% 99%   Vitals:   01/05/23 2033 01/05/23 2351 01/06/23 0314 01/06/23 0732  BP:  103/85 97/78 116/87  Pulse:  68 82 73  Resp:  18 18 19   Temp:  (!) 97.4 F (36.3 C) (!) 97.5 F (36.4 C) (!) 97.4 F (36.3 C)  TempSrc:      SpO2: 97% 97% 97% 99%  Weight:      Height:        General: Pt is alert, awake, not in acute distress Cardiovascular: RRR, S1/S2 +, no rubs, no gallops Respiratory: CTA bilaterally, no wheezing, no rhonchi Abdominal: Soft, NT, ND, bowel sounds + Extremities: no edema, no cyanosis    The results of significant diagnostics from this hospitalization (including imaging, microbiology, ancillary and laboratory) are listed below for reference.     Microbiology: Recent Results (from the past 240 hour(s))  Resp panel by RT-PCR (RSV, Flu A&B, Covid) Anterior Nasal Swab     Status: None   Collection Time: 01/01/23  1:04 PM   Specimen: Anterior Nasal Swab  Result Value Ref Range Status   SARS Coronavirus 2 by RT PCR NEGATIVE NEGATIVE Final    Comment: (NOTE) SARS-CoV-2 target nucleic acids are NOT DETECTED.  The SARS-CoV-2 RNA is generally detectable in upper respiratory specimens during the acute phase of infection. The lowest concentration of SARS-CoV-2 viral copies this assay can detect is 138 copies/mL. A negative result does not preclude SARS-Cov-2 infection and should not be used as the sole basis for treatment or other patient management decisions. A negative result may occur with  improper specimen collection/handling, submission of specimen other than nasopharyngeal swab, presence of viral mutation(s) within the areas targeted by this assay, and inadequate number of viral copies(<138 copies/mL). A negative result must be combined with clinical observations, patient history, and epidemiological information. The expected result is Negative.  Fact Sheet  for Patients:   BloggerCourse.com  Fact Sheet for Healthcare Providers:  SeriousBroker.it  This test is no t yet approved or cleared by the [place] FDA and  has been authorized for detection and/or diagnosis of SARS-CoV-2 by FDA under an Emergency Use Authorization (EUA). This EUA will remain  in effect (meaning this test can be used) for the duration of the COVID-19 declaration under Section 564(b)(1) of the Act, 21 U.S.C.section 360bbb-3(b)(1), unless the authorization is terminated  or revoked sooner.       Influenza A by PCR NEGATIVE NEGATIVE Final   Influenza B by PCR NEGATIVE NEGATIVE Final    Comment: (NOTE) The Xpert Xpress SARS-CoV-2/FLU/RSV plus assay is intended as an aid in the diagnosis of influenza from Nasopharyngeal swab specimens and should not be used as a sole basis for treatment. Nasal washings and aspirates are unacceptable for Xpert Xpress SARS-CoV-2/FLU/RSV testing.  Fact Sheet for Patients: BloggerCourse.com  Fact Sheet for Healthcare Providers: SeriousBroker.it  This test is not yet approved or cleared by the [place] FDA and has been authorized for detection and/or diagnosis of SARS-CoV-2 by FDA under an Emergency Use Authorization (EUA). This EUA will remain in effect (meaning this test can be used) for the duration of the COVID-19 declaration under Section 564(b)(1) of the Act, 21 U.S.C. section 360bbb-3(b)(1), unless the authorization is terminated or revoked.     Resp Syncytial Virus by PCR NEGATIVE NEGATIVE Final    Comment: (NOTE) Fact Sheet for Patients: BloggerCourse.com  Fact Sheet for Healthcare Providers: SeriousBroker.it  This test is not yet approved or cleared by the [place] FDA and has been authorized for detection and/or diagnosis of SARS-CoV-2 by FDA under an Emergency Use  Authorization (EUA). This EUA will remain in effect (meaning this test can be used) for the duration of the COVID-19 declaration under Section 564(b)(1) of the Act, 21 U.S.C. section 360bbb-3(b)(1), unless the authorization is terminated or revoked.  Performed at [facility], [address] [place], [place] [address]      Labs: BNP (last 3 results) Recent Labs    01/01/23 1228  BNP 226.2*   Basic Metabolic Panel: Recent Labs  Lab 01/02/23 0428 01/03/23 0943 01/04/23 0411 01/05/23 0619 01/06/23 0330  NA 138 135 135 138 138  K 3.9 3.9 3.7 3.7 4.1  CL 100 97* 100 99 100  CO2 29 27 29 30 30   GLUCOSE 88 140* 90 92 87  BUN 21 31* 29* 30* 26*  CREATININE 0.76 0.97 0.81 0.96 0.89  CALCIUM 8.9 9.3 8.9 9.1 9.4  MG  --  2.7* 2.6*  --   --    Liver Function Tests: No results for input(s): "AST", "ALT", "ALKPHOS", "BILITOT", "PROT", "ALBUMIN" in the last 168 hours. No results for input(s): "LIPASE", "AMYLASE" in the last 168 hours. No results for input(s): "AMMONIA" in the last 168 hours. CBC: Recent Labs  Lab 01/02/23 0428 01/03/23 0636 01/04/23 0413 01/05/23 0433 01/06/23 0330  WBC 5.5 6.3 6.2 6.3 6.1  HGB 12.1* 13.4 12.7* 13.4 13.5  HCT 36.9* 39.7 37.7* 40.7 39.7  MCV 90.4 87.3 87.3 89.3 87.1  PLT 239 245 234 244 232   Cardiac Enzymes: No results for input(s): "CKTOTAL", "CKMB", "CKMBINDEX", "TROPONINI" in the last 168 hours. BNP: Invalid input(s): "POCBNP" CBG: No results for input(s): "GLUCAP" in the last 168 hours. D-Dimer No results for input(s): "DDIMER" in the last 72 hours. Hgb A1c No results for input(s): "HGBA1C" in the last 72 hours.  Lipid Profile No results for input(s): "CHOL", "HDL", "LDLCALC", "TRIG", "CHOLHDL", "LDLDIRECT" in the last 72 hours. Thyroid function studies No results for input(s): "TSH", "T4TOTAL", "T3FREE", "THYROIDAB" in the last 72 hours.  Invalid input(s): "FREET3" Anemia work up No results for input(s):  "VITAMINB12", "FOLATE", "FERRITIN", "TIBC", "IRON", "RETICCTPCT" in the last 72 hours. Urinalysis    Component Value Date/Time   COLORURINE Yellow 07/15/2013 1029   APPEARANCEUR Clear 07/15/2013 1029   LABSPEC 1.013 07/15/2013 1029   PHURINE 6.0 07/15/2013 1029   GLUCOSEU Negative 07/15/2013 1029   HGBUR 1+ 07/15/2013 1029   BILIRUBINUR Negative 07/15/2013 1029   KETONESUR Negative 07/15/2013 1029   PROTEINUR 30 mg/dL 66/44/0347 4259   NITRITE Negative 07/15/2013 1029   LEUKOCYTESUR 3+ 07/15/2013 1029   Sepsis Labs Recent Labs  Lab 01/03/23 0636 01/04/23 0413 01/05/23 0433 01/06/23 0330  WBC 6.3 6.2 6.3 6.1   Microbiology Recent Results (from the past 240 hour(s))  Resp panel by RT-PCR (RSV, Flu A&B, Covid) Anterior Nasal Swab     Status: None   Collection Time: 01/01/23  1:04 PM   Specimen: Anterior Nasal Swab  Result Value Ref Range Status   SARS Coronavirus 2 by RT PCR NEGATIVE NEGATIVE Final    Comment: (NOTE) SARS-CoV-2 target nucleic acids are NOT DETECTED.  The SARS-CoV-2 RNA is generally detectable in upper respiratory specimens during the acute phase of infection. The lowest concentration of SARS-CoV-2 viral copies this assay can detect is 138 copies/mL. A negative result does not preclude SARS-Cov-2 infection and should not be used as the sole basis for treatment or other patient management decisions. A negative result may occur with  improper specimen collection/handling, submission of specimen other than nasopharyngeal swab, presence of viral mutation(s) within the areas targeted by this assay, and inadequate number of viral copies(<138 copies/mL). A negative result must be combined with clinical observations, patient history, and epidemiological information. The expected result is Negative.  Fact Sheet for Patients:  BloggerCourse.com  Fact Sheet for Healthcare Providers:  SeriousBroker.it  This test  is no t yet approved or cleared by the [place] FDA and  has been authorized for detection and/or diagnosis of SARS-CoV-2 by FDA under an Emergency Use Authorization (EUA). This EUA will remain  in effect (meaning this test can be used) for the duration of the COVID-19 declaration under Section 564(b)(1) of the Act, 21 U.S.C.section 360bbb-3(b)(1), unless the authorization is terminated  or revoked sooner.       Influenza A by PCR NEGATIVE NEGATIVE Final   Influenza B by PCR NEGATIVE NEGATIVE Final    Comment: (NOTE) The Xpert Xpress SARS-CoV-2/FLU/RSV plus assay is intended as an aid in the diagnosis of influenza from Nasopharyngeal swab specimens and should not be used as a sole basis for treatment. Nasal washings and aspirates are unacceptable for Xpert Xpress SARS-CoV-2/FLU/RSV testing.  Fact Sheet for Patients: BloggerCourse.com  Fact Sheet for Healthcare Providers: SeriousBroker.it  This test is not yet approved or cleared by the [place] FDA and has been authorized for detection and/or diagnosis of SARS-CoV-2 by FDA under an Emergency Use Authorization (EUA). This EUA will remain in effect (meaning this test can be used) for the duration of the COVID-19 declaration under Section 564(b)(1) of the Act, 21 U.S.C. section 360bbb-3(b)(1), unless the authorization is terminated or revoked.     Resp Syncytial Virus by PCR NEGATIVE NEGATIVE Final    Comment: (NOTE) Fact Sheet for Patients: BloggerCourse.com  Fact Sheet for Healthcare Providers: SeriousBroker.it  This test is not yet approved or cleared by the [place] and has been authorized for detection and/or diagnosis of SARS-CoV-2 by FDA under an Emergency Use Authorization (EUA). This EUA will remain in effect (meaning this test can be used) for the duration of the COVID-19 declaration under Section  564(b)(1) of the Act, 21 U.S.C. section 360bbb-3(b)(1), unless the authorization is terminated or revoked.  Performed at [facility], [address], [place], [place] [address]      Time coordinating discharge: Over 30 minutes  SIGNED:   Tresa Moore, MD  [facility] 01/06/2023, 12:28 PM Pager   If 7PM-7AM, please contact night-coverage

## 2023-01-06 NOTE — Plan of Care (Signed)
  Problem: Education: Goal: Ability to demonstrate management of disease process will improve Outcome: Adequate for Discharge Goal: Ability to verbalize understanding of medication therapies will improve Outcome: Adequate for Discharge Goal: Individualized Educational Video(s) Outcome: Adequate for Discharge   Problem: Activity: Goal: Capacity to carry out activities will improve Outcome: Adequate for Discharge   Problem: Cardiac: Goal: Ability to achieve and maintain adequate cardiopulmonary perfusion will improve Outcome: Adequate for Discharge   Problem: Education: Goal: Understanding of CV disease, CV risk reduction, and recovery process will improve Outcome: Adequate for Discharge Goal: Individualized Educational Video(s) Outcome: Adequate for Discharge   Problem: Activity: Goal: Ability to return to baseline activity level will improve Outcome: Adequate for Discharge   Problem: Cardiovascular: Goal: Ability to achieve and maintain adequate cardiovascular perfusion will improve Outcome: Adequate for Discharge Goal: Vascular access site(s) Level 0-1 will be maintained Outcome: Adequate for Discharge   Problem: Health Behavior/Discharge Planning: Goal: Ability to safely manage health-related needs after discharge will improve Outcome: Adequate for Discharge   Problem: Education: Goal: Knowledge of General Education information will improve Description: Including pain rating scale, medication(s)/side effects and non-pharmacologic comfort measures Outcome: Adequate for Discharge   Problem: Health Behavior/Discharge Planning: Goal: Ability to manage health-related needs will improve Outcome: Adequate for Discharge   Problem: Clinical Measurements: Goal: Ability to maintain clinical measurements within normal limits will improve Outcome: Adequate for Discharge Goal: Will remain free from infection Outcome: Adequate for Discharge Goal: Diagnostic test results will  improve Outcome: Adequate for Discharge Goal: Respiratory complications will improve Outcome: Adequate for Discharge Goal: Cardiovascular complication will be avoided Outcome: Adequate for Discharge   Problem: Activity: Goal: Risk for activity intolerance will decrease Outcome: Adequate for Discharge   Problem: Nutrition: Goal: Adequate nutrition will be maintained Outcome: Adequate for Discharge   Problem: Coping: Goal: Level of anxiety will decrease Outcome: Adequate for Discharge   Problem: Elimination: Goal: Will not experience complications related to bowel motility Outcome: Adequate for Discharge Goal: Will not experience complications related to urinary retention Outcome: Adequate for Discharge   Problem: Pain Managment: Goal: General experience of comfort will improve Outcome: Adequate for Discharge   Problem: Safety: Goal: Ability to remain free from injury will improve Outcome: Adequate for Discharge   Problem: Skin Integrity: Goal: Risk for impaired skin integrity will decrease Outcome: Adequate for Discharge

## 2023-01-07 ENCOUNTER — Encounter: Payer: Medicare HMO | Admitting: Family

## 2023-01-07 LAB — LIPOPROTEIN A (LPA): Lipoprotein (a): 31.6 nmol/L — ABNORMAL HIGH (ref <75.0)

## 2023-01-16 ENCOUNTER — Other Ambulatory Visit: Payer: Self-pay | Admitting: Internal Medicine

## 2023-01-16 DIAGNOSIS — G8929 Other chronic pain: Secondary | ICD-10-CM

## 2023-01-23 ENCOUNTER — Encounter: Payer: Self-pay | Admitting: Urgent Care

## 2023-01-26 ENCOUNTER — Encounter: Payer: Self-pay | Admitting: Urgent Care

## 2023-01-28 ENCOUNTER — Encounter: Payer: Self-pay | Admitting: Urgent Care

## 2023-01-28 ENCOUNTER — Encounter: Payer: Self-pay | Admitting: Internal Medicine

## 2023-01-29 ENCOUNTER — Encounter: Payer: Self-pay | Admitting: Internal Medicine

## 2023-02-02 ENCOUNTER — Encounter: Payer: Self-pay | Admitting: Internal Medicine

## 2023-02-02 ENCOUNTER — Other Ambulatory Visit: Payer: Medicare HMO

## 2023-02-05 DIAGNOSIS — I5020 Unspecified systolic (congestive) heart failure: Secondary | ICD-10-CM | POA: Insufficient documentation

## 2023-02-06 ENCOUNTER — Ambulatory Visit
Admission: RE | Admit: 2023-02-06 | Discharge: 2023-02-06 | Disposition: A | Discharge status: Home / Self Care | Payer: Medicare HMO | Source: Ambulatory Visit | Attending: Internal Medicine | Admitting: Internal Medicine

## 2023-02-06 DIAGNOSIS — G8929 Other chronic pain: Secondary | ICD-10-CM

## 2023-08-03 ENCOUNTER — Encounter: Payer: Self-pay | Admitting: Urgent Care

## 2023-08-12 ENCOUNTER — Encounter: Payer: Self-pay | Admitting: Urgent Care

## 2023-08-19 ENCOUNTER — Ambulatory Visit: Admitting: Pain Medicine

## 2023-08-30 DIAGNOSIS — R109 Unspecified abdominal pain: Secondary | ICD-10-CM | POA: Insufficient documentation

## 2023-08-30 DIAGNOSIS — G4733 Obstructive sleep apnea (adult) (pediatric): Secondary | ICD-10-CM | POA: Insufficient documentation

## 2023-08-30 NOTE — Progress Notes (Unsigned)
 PROVIDER NOTE: Interpretation of information contained herein should be left to medically-trained personnel. Specific patient instructions are provided elsewhere under "Patient Instructions" section of medical record. This document was created in part using AI and STT-dictation technology, any transcriptional errors that may result from this process are unintentional.  Patient: Spencer Herring  Service: E/M Encounter  Provider: Candi Chafe, MD  DOB: 08/01/1955  Delivery: Face-to-face  Specialty: Interventional Pain Management  MRN: [id number]  Setting: Ambulatory outpatient facility  Specialty designation: 09  Type: New Patient  Location: Outpatient office facility  PCP: Antonio Baumgarten, MD  DOS: 08/31/2023    Referring Prov.: Antonio Baumgarten, MD   Primary Reason(s) for Visit: Encounter for initial evaluation of one or more chronic problems (new to examiner) potentially causing chronic pain, and posing a threat to normal musculoskeletal function. (Level of risk: High) CC: No chief complaint on file.  HPI  Spencer Herring is a 68 y.o. year old, male patient, who comes for the first time to our practice referred by Antonio Baumgarten, MD for our initial evaluation of his chronic pain. 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); Bilateral lower extremity edema; Varicose veins of bilateral lower extremities with pain; Iron deficiency anemia following bariatric surgery; B12 deficiency; Acute exacerbation of CHF (congestive heart failure) (HCC); Hyperlipidemia; Morbid obesity with BMI of 40.0-44.9, adult (HCC); Abdominal pain; Anemia; Ankle pain; Centrilobular emphysema (HCC); DJD (degenerative joint disease); Essential hypertension; H/O bariatric surgery; HFrEF (heart failure with reduced ejection fraction) (HCC); Localized edema; OSA (obstructive sleep apnea); Osteoarthritis of ankle; Plantar fasciitis;  Primary osteoarthritis involving multiple joints; Swelling of ankle joint; Tibialis posterior tendinitis; Umbilical hernia, incarcerated; Stable angina pectoris (HCC); Coronary atherosclerosis of native coronary artery; and Hyperlipidemia, mixed on their problem list. Today he comes in for evaluation of his No chief complaint on file.  Pain Assessment: Location:     Radiating:   Onset:   Duration:   Quality:   Severity:  /10 (subjective, self-reported pain score)  Effect on ADL:   Timing:   Modifying factors:   BP:    HR:    Onset and Duration: {Hx; Onset and Duration:210120511} Cause of pain: {Hx; Cause:210120521} Severity: {Pain Severity:210120502} Timing: {Symptoms; Timing:210120501} Aggravating Factors: {Causes; Aggravating pain factors:210120507} Alleviating Factors: {Causes; Alleviating Factors:210120500} Associated Problems: {Hx; Associated problems:210120515} Quality of Pain: {Hx; Symptom quality or Descriptor:210120531} Previous Examinations or Tests: {Hx; Previous examinations or test:210120529} Previous Treatments: {Hx; Previous Treatment:210120503}  Spencer Herring is being evaluated for possible interventional pain management therapies for the treatment of his chronic pain.  Discussed the use of AI scribe software for clinical note transcription with the patient, who gave verbal consent to proceed.  History of Present Illness           ***  Spencer Herring has been informed that this initial visit was an evaluation only.  On the follow up appointment I will go over the results, including ordered tests and available interventional therapies. At that time he will have the opportunity to decide whether to proceed with offered therapies or not. In the event that Spencer Herring prefers avoiding interventional options, this will conclude our involvement in the case.  Medication management recommendations may be provided upon request.  Patient informed that diagnostic tests may be  ordered to assist in identifying underlying causes, narrow the list of differential diagnoses and aid in determining candidacy for (or contraindications to) planned therapeutic interventions.  Historic Controlled  Substance Pharmacotherapy Review  PMP and historical list of controlled substances: Alprazolam  0.25 Mg Tablet  ;Oxycodone -Acetaminophn 7.5-325 ;Tramadol  Hcl 50 Mg Tablet  Most recently prescribed opioid analgesics: Oxycodone -Acetaminophn 7.5-325 ;Tramadol  Hcl 50 Mg Tablet  MME/day: 42.5 mg/day  Historical Monitoring: The patient  reports no history of drug use. List of prior UDS Testing: No results found for: "MDMA", "COCAINSCRNUR", "PCPSCRNUR", "PCPQUANT", "CANNABQUANT", "THCU", "ETH", "CBDTHCR", "D8THCCBX", "D9THCCBX" Historical Background Evaluation: [place] PMP: PDMP reviewed during this encounter. Review of the past 66-months conducted.             PMP NARX Score Report:  Narcotic: 612 Sedative: 331 Stimulant: 000 [place] Department of public safety, offender search: Engineer, mining Information) Non-contributory Risk Assessment Profile: Aberrant behavior: None observed or detected today Risk factors for fatal opioid overdose: None identified today PMP NARX Overdose Risk Score: 410 Fatal overdose hazard ratio (HR): Calculation deferred Non-fatal overdose hazard ratio (HR): Calculation deferred Risk of opioid abuse or dependence: 0.7-3.0% with doses <= 36 MME/day and 6.1-26% with doses >= 120 MME/day. Substance use disorder (SUD) risk level: See below Personal History of Substance Abuse (SUD-Substance use disorder):  Alcohol:    Illegal Drugs:    Rx Drugs:    ORT Risk Level calculation:    ORT Scoring interpretation table:  Score <3 = Low Risk for SUD  Score between 4-7 = Moderate Risk for SUD  Score >8 = High Risk for Opioid Abuse   PHQ-2 Depression Scale:  Total score:    PHQ-2 Scoring interpretation table: (Score and probability of major depressive disorder)  Score 0 = No  depression  Score 1 = 15.4% Probability  Score 2 = 21.1% Probability  Score 3 = 38.4% Probability  Score 4 = 45.5% Probability  Score 5 = 56.4% Probability  Score 6 = 78.6% Probability   PHQ-9 Depression Scale:  Total score:    PHQ-9 Scoring interpretation table:  Score 0-4 = No depression  Score 5-9 = Mild depression  Score 10-14 = Moderate depression  Score 15-19 = Moderately severe depression  Score 20-27 = Severe depression (2.4 times higher risk of SUD and 2.89 times higher risk of overuse)   Pharmacologic Plan: As per protocol, I have not taken over any controlled substance management, pending the results of ordered tests and/or consults.            Initial impression: Pending review of available data and ordered tests.  Meds   Current Outpatient Medications:    albuterol  (ACCUNEB ) 0.63 MG/3ML nebulizer solution, Take 1 ampule by nebulization every 4 (four) hours as needed for wheezing., Disp: , Rfl:    albuterol  (VENTOLIN  HFA) 108 (90 Base) MCG/ACT inhaler, Ventolin  HFA 90 mcg/actuation aerosol inhaler  INHALE 2 INHALATIONS INTO THE LUNGS EVERY 4 (FOUR) HOURS AS NEEDED FOR WHEEZING, Disp: , Rfl:    ALPRAZolam  (XANAX ) 0.25 MG tablet, Take 0.25 mg by mouth at bedtime as needed for anxiety., Disp: , Rfl:    apixaban  (ELIQUIS ) 5 MG TABS tablet, Take 1 tablet (5 mg total) by mouth 2 (two) times daily., Disp: 60 tablet, Rfl: 1   ascorbic acid  (VITAMIN C ) 250 MG tablet, Take 250 mg by mouth daily., Disp: , Rfl:    aspirin  81 MG chewable tablet, Chew 1 tablet (81 mg total) by mouth daily., Disp: 90 tablet, Rfl: 4   atorvastatin  (LIPITOR ) 80 MG tablet, Take 0.5 tablets (40 mg total) by mouth daily., Disp: 30 tablet, Rfl: 1   budesonide -formoterol (SYMBICORT) 160-4.5 MCG/ACT inhaler, Inhale 2  puffs into the lungs daily as needed (SHORTNESS OF BREATH). , Disp: , Rfl:    carvedilol  (COREG ) 25 MG tablet, Take 1 tablet (25 mg total) by mouth 2 (two) times daily with a meal., Disp: 60 tablet,  Rfl: 1   ferrous sulfate  325 (65 FE) MG tablet, Take 325 mg by mouth 2 (two) times daily with a meal. , Disp: , Rfl:    furosemide  (LASIX ) 20 MG tablet, Take 1 tablet (20 mg total) by mouth daily., Disp: 30 tablet, Rfl: 1   gabapentin  (NEURONTIN ) 300 MG capsule, Take 300 mg by mouth at bedtime., Disp: , Rfl:    losartan  (COZAAR ) 25 MG tablet, Take 1 tablet by mouth daily., Disp: , Rfl:    Multiple Vitamin (MULTIVITAMIN) tablet, Take 1 tablet by mouth daily., Disp: , Rfl:    nitroGLYCERIN  (NITROSTAT ) 0.4 MG SL tablet, Place 0.4 mg under the tongue every 5 (five) minutes as needed. , Disp: , Rfl:    oxyCODONE -acetaminophen  (PERCOCET) 7.5-325 MG per tablet, Take 1 tablet by mouth 2 (two) times daily as needed for severe pain., Disp: , Rfl:    ranolazine  (RANEXA ) 500 MG 12 hr tablet, Take 500 mg by mouth 2 (two) times daily., Disp: , Rfl:    spironolactone  (ALDACTONE ) 25 MG tablet, Take 0.5 tablets (12.5 mg total) by mouth daily., Disp: 15 tablet, Rfl: 1   tiZANidine  (ZANAFLEX ) 2 MG tablet, Take 2 mg by mouth every 6 (six) hours as needed for muscle spasms., Disp: , Rfl:    traMADol  (ULTRAM ) 50 MG tablet, Take 50 mg by mouth every 12 (twelve) hours as needed for moderate pain., Disp: , Rfl:   Imaging Review  Cervical Imaging: Cervical MR wo contrast: No results found for this or any previous visit.  Cervical MR wo contrast: No results found for this or any previous visit.  Cervical MR w/wo contrast: No results found for this or any previous visit.  Cervical MR w contrast: No results found for this or any previous visit.  Cervical CT wo contrast: No results found for this or any previous visit.  Cervical CT w/wo contrast: No results found for this or any previous visit.  Cervical CT w/wo contrast: No results found for this or any previous visit.  Cervical CT w contrast: No results found for this or any previous visit.  Cervical CT outside: No results found for this or any previous  visit.  Cervical DG 1 view: No results found for this or any previous visit.  Cervical DG 2-3 views: No results found for this or any previous visit.  Cervical DG F/E views: No results found for this or any previous visit.  Cervical DG 2-3 clearing views: No results found for this or any previous visit.  Cervical DG Bending/F/E views: No results found for this or any previous visit.  Cervical DG complete: No results found for this or any previous visit.  Cervical DG Myelogram views: No results found for this or any previous visit.  Cervical DG Myelogram views: No results found for this or any previous visit.  Cervical Discogram views: No results found for this or any previous visit.   Shoulder Imaging: Shoulder-R MR w contrast: No results found for this or any previous visit.  Shoulder-L MR w contrast: No results found for this or any previous visit.  Shoulder-R MR w/wo contrast: No results found for this or any previous visit.  Shoulder-L MR w/wo contrast: No results found for this or any previous visit.  Shoulder-R MR wo contrast: No results found for this or any previous visit.  Shoulder-L MR wo contrast: Results for orders placed during the hospital encounter of 02/06/23  MR SHOULDER LEFT WO CONTRAST  Narrative CLINICAL DATA:  Left shoulder pain for 3 months. Limited range of motion.  EXAM: MRI OF THE LEFT SHOULDER WITHOUT CONTRAST  TECHNIQUE: Multiplanar, multisequence MR imaging of the shoulder was performed. No intravenous contrast was administered.  COMPARISON:  None Available.  FINDINGS: Rotator cuff: Complete tear of the supraspinatus tendon with 4.6 cm of retraction. Moderate tendinosis of the infraspinatus tendon with a large full-thickness tear involving the anterior 2/3 of the tendon insertion. Teres minor tendon is intact. Moderate tendinosis of the subscapularis tendon with a partial-thickness tear of the superior fibers.  Muscles: Mild fatty  atrophy of the supraspinatus muscle. Moderate fatty atrophy of the infraspinatus muscle. Mild muscle edema in the infraspinatus muscle likely reflecting mild muscle strain.  Biceps Long Head: Complete tear of the proximal long head of the biceps tendon.  Acromioclavicular Joint: Severe arthropathy of the acromioclavicular joint. Small amount of subacromial/subdeltoid bursal fluid.  Glenohumeral Joint: Moderate joint effusion. Mild partial-thickness cartilage loss of the glenohumeral joint.  Labrum: Grossly intact, but evaluation is limited by lack of intraarticular fluid/contrast.  Bones: No fracture or dislocation. No aggressive osseous lesion.  Other: No fluid collection or hematoma.  IMPRESSION: 1. Complete tear of the supraspinatus tendon with 4.6 cm of retraction. 2. Moderate tendinosis of the infraspinatus tendon with a large full-thickness tear involving the anterior 2/3 of the tendon insertion. 3. Moderate tendinosis of the subscapularis tendon with a partial-thickness tear of the superior fibers. 4. Complete tear of the proximal long head of the biceps tendon.   Electronically Signed By: Onnie Bilis M.D. On: 02/16/2023 06:08  Shoulder-R CT w contrast: No results found for this or any previous visit.  Shoulder-L CT w contrast: No results found for this or any previous visit.  Shoulder-R CT w/wo contrast: No results found for this or any previous visit.  Shoulder-L CT w/wo contrast: No results found for this or any previous visit.  Shoulder-R CT wo contrast: No results found for this or any previous visit.  Shoulder-L CT wo contrast: No results found for this or any previous visit.  Shoulder-R DG Arthrogram: No results found for this or any previous visit.  Shoulder-L DG Arthrogram: No results found for this or any previous visit.  Shoulder-R DG 1 view: No results found for this or any previous visit.  Shoulder-L DG 1 view: No results found for this or any  previous visit.  Shoulder-R DG: No results found for this or any previous visit.  Shoulder-L DG: No results found for this or any previous visit.   Thoracic Imaging: Thoracic MR wo contrast: No results found for this or any previous visit.  Thoracic MR wo contrast: No results found for this or any previous visit.  Thoracic MR w/wo contrast: No results found for this or any previous visit.  Thoracic MR w contrast: No results found for this or any previous visit.  Thoracic CT wo contrast: No results found for this or any previous visit.  Thoracic CT w/wo contrast: No results found for this or any previous visit.  Thoracic CT w/wo contrast: No results found for this or any previous visit.  Thoracic CT w contrast: No results found for this or any previous visit.  Thoracic DG 2-3 views: No results found for this or any previous visit.  Thoracic DG 4 views: No results found for this or any previous visit.  Thoracic DG: No results found for this or any previous visit.  Thoracic DG w/swimmers view: No results found for this or any previous visit.  Thoracic DG Myelogram views: No results found for this or any previous visit.  Thoracic DG Myelogram views: No results found for this or any previous visit.   Lumbosacral Imaging: Lumbar MR wo contrast: No results found for this or any previous visit.  Lumbar MR wo contrast: No results found for this or any previous visit.  Lumbar MR w/wo contrast: No results found for this or any previous visit.  Lumbar MR w/wo contrast: No results found for this or any previous visit.  Lumbar MR w contrast: No results found for this or any previous visit.  Lumbar CT wo contrast: No results found for this or any previous visit.  Lumbar CT w/wo contrast: No results found for this or any previous visit.  Lumbar CT w/wo contrast: No results found for this or any previous visit.  Lumbar CT w contrast: No results found for this or any previous  visit.  Lumbar DG 1V: No results found for this or any previous visit.  Lumbar DG 1V (Clearing): No results found for this or any previous visit.  Lumbar DG 2-3V (Clearing): No results found for this or any previous visit.  Lumbar DG 2-3 views: No results found for this or any previous visit.  Lumbar DG (Complete) 4+V: No results found for this or any previous visit.        Lumbar DG F/E views: No results found for this or any previous visit.        Lumbar DG Bending views: No results found for this or any previous visit.        Lumbar DG Myelogram views: No results found for this or any previous visit.  Lumbar DG Myelogram: No results found for this or any previous visit.  Lumbar DG Myelogram: No results found for this or any previous visit.  Lumbar DG Myelogram: No results found for this or any previous visit.  Lumbar DG Myelogram Lumbosacral: No results found for this or any previous visit.  Lumbar DG Diskogram views: No results found for this or any previous visit.  Lumbar DG Diskogram views: No results found for this or any previous visit.  Lumbar DG Epidurogram OP: No results found for this or any previous visit.  Lumbar DG Epidurogram IP: No results found for this or any previous visit.   Sacroiliac Joint Imaging: Sacroiliac Joint DG: No results found for this or any previous visit.  Sacroiliac Joint MR w/wo contrast: No results found for this or any previous visit.  Sacroiliac Joint MR wo contrast: No results found for this or any previous visit.   Spine Imaging: Whole Spine DG Myelogram views: No results found for this or any previous visit.  Whole Spine MR Mets screen: No results found for this or any previous visit.  Whole Spine MR Mets screen: No results found for this or any previous visit.  Whole Spine MR w/wo: No results found for this or any previous visit.  MRA Spinal Canal w/ cm: No results found for this or any previous visit.  MRA Spinal Canal  wo/ cm: No results found for this or any previous visit.  MRA Spinal Canal w/wo cm: No results found for this or any previous visit.  Spine Outside MR Films: No results found for this  or any previous visit.  Spine Outside CT Films: No results found for this or any previous visit.  CT-Guided Biopsy: No results found for this or any previous visit.  CT-Guided Needle Placement: No results found for this or any previous visit.  DG Spine outside: No results found for this or any previous visit.  IR Spine outside: No results found for this or any previous visit.  NM Spine outside: No results found for this or any previous visit.   Hip Imaging: Hip-R MR w contrast: No results found for this or any previous visit.  Hip-L MR w contrast: No results found for this or any previous visit.  Hip-R MR w/wo contrast: No results found for this or any previous visit.  Hip-L MR w/wo contrast: No results found for this or any previous visit.  Hip-R MR wo contrast: No results found for this or any previous visit.  Hip-L MR wo contrast: No results found for this or any previous visit.  Hip-R CT w contrast: No results found for this or any previous visit.  Hip-L CT w contrast: No results found for this or any previous visit.  Hip-R CT w/wo contrast: No results found for this or any previous visit.  Hip-L CT w/wo contrast: No results found for this or any previous visit.  Hip-R CT wo contrast: No results found for this or any previous visit.  Hip-L CT wo contrast: No results found for this or any previous visit.  Hip-R DG 2-3 views: No results found for this or any previous visit.  Hip-L DG 2-3 views: No results found for this or any previous visit.  Hip-R DG Arthrogram: No results found for this or any previous visit.  Hip-L DG Arthrogram: No results found for this or any previous visit.  Hip-B DG Bilateral: No results found for this or any previous visit.  Hip-B DG Bilateral (5V): No  results found for this or any previous visit.   Knee Imaging: Knee-R MR w contrast: No results found for this or any previous visit.  Knee-L MR w/o contrast: No results found for this or any previous visit.  Knee-R MR w/wo contrast: No results found for this or any previous visit.  Knee-L MR w/wo contrast: No results found for this or any previous visit.  Knee-R MR wo contrast: No results found for this or any previous visit.  Knee-L MR wo contrast: No results found for this or any previous visit.  Knee-R CT w contrast: No results found for this or any previous visit.  Knee-L CT w contrast: No results found for this or any previous visit.  Knee-R CT w/wo contrast: No results found for this or any previous visit.  Knee-L CT w/wo contrast: No results found for this or any previous visit.  Knee-R CT wo contrast: No results found for this or any previous visit.  Knee-L CT wo contrast: No results found for this or any previous visit.  Knee-R DG 1-2 views: No results found for this or any previous visit.  Knee-L DG 1-2 views: No results found for this or any previous visit.  Knee-R DG 3 views: No results found for this or any previous visit.  Knee-L DG 3 views: No results found for this or any previous visit.  Knee-R DG 4 views: No results found for this or any previous visit.  Knee-L DG 4 views: Results for orders placed during the hospital encounter of 07/26/21  DG Knee Complete 4 Views Left  Narrative  CLINICAL DATA:  Fall, left ankle and knee pain.  EXAM: LEFT KNEE - COMPLETE 4+ VIEW  COMPARISON:  None.  FINDINGS: No evidence of fracture, dislocation, or joint effusion. Moderate osteoarthritis of the medial compartment and mild osteoarthritis of the lateral and patellofemoral compartments. No focal bone abnormality. Soft tissues are unremarkable.  IMPRESSION: No acute osseous abnormality.  Tricompartmental osteoarthritis, most pronounced in the  medial compartment.   Electronically Signed By: Mercie Stalker M.D. On: 07/26/2021 13:30  Knee-R DG Arthrogram: No results found for this or any previous visit.  Knee-L DG Arthrogram: No results found for this or any previous visit.   Ankle Imaging: Ankle-R DG Complete: No results found for this or any previous visit.  Ankle-L DG Complete: Results for orders placed during the hospital encounter of 07/26/21  DG Ankle Complete Left  Narrative CLINICAL DATA:  Left ankle and knee pain.  Status post fall.  EXAM: LEFT ANKLE COMPLETE - 3+ VIEW  COMPARISON:  None.  FINDINGS: Acute, obliquely oriented intra-articular fracture is noted involving the lateral malleolus. The fracture fragments are in near anatomic alignment. Diffuse soft tissue swelling noted.  IMPRESSION: 1. Acute, obliquely oriented intra-articular fracture involves the lateral malleolus. 2. Soft tissue swelling.   Electronically Signed By: Kimberley Penman M.D. On: 07/26/2021 13:31   Foot Imaging: Foot-R DG Complete: No results found for this or any previous visit.  Foot-L DG Complete: No results found for this or any previous visit.   Elbow Imaging: Elbow-R DG Complete: No results found for this or any previous visit.  Elbow-L DG Complete: No results found for this or any previous visit.   Wrist Imaging: Wrist-R DG Complete: No results found for this or any previous visit.  Wrist-L DG Complete: No results found for this or any previous visit.   Hand Imaging: Hand-R DG Complete: No results found for this or any previous visit.  Hand-L DG Complete: No results found for this or any previous visit.   Complexity Note: Imaging results reviewed.                         ROS  Cardiovascular: {Hx; Cardiovascular History:210120525} Pulmonary or Respiratory: {Hx; Pumonary and/or Respiratory History:210120523} Neurological: {Hx; Neurological:210120504} Psychological-Psychiatric: {Hx;  Psychological-Psychiatric History:210120512} Gastrointestinal: {Hx; Gastrointestinal:210120527} Genitourinary: {Hx; Genitourinary:210120506} Hematological: {Hx; Hematological:210120510} Endocrine: {Hx; Endocrine history:210120509} Rheumatologic: {Hx; Rheumatological:210120530} Musculoskeletal: {Hx; Musculoskeletal:210120528} Work History: {Hx; Work history:210120514}  Allergies  Spencer Herring is allergic to latex.  Laboratory Chemistry Profile   Renal Lab Results  Component Value Date   BUN 26 (H) 01/06/2023   CREATININE 0.89 01/06/2023   GFRAA >60 12/02/2017   GFRNONAA >60 01/06/2023   PROTEINUR 30 mg/dL 16/01/9603     Electrolytes Lab Results  Component Value Date   NA 138 01/06/2023   K 4.1 01/06/2023   CL 100 01/06/2023   CALCIUM  9.4 01/06/2023   MG 2.6 (H) 01/04/2023   PHOS 3.5 01/05/2013     Hepatic Lab Results  Component Value Date   AST 15 05/04/2014   ALT 23 05/04/2014   ALBUMIN 3.9 05/04/2014   ALKPHOS 84 05/04/2014   LIPASE 94 10/15/2012     ID Lab Results  Component Value Date   SARSCOV2NAA NEGATIVE 01/01/2023     Bone No results found for: "VD25OH", "VD125OH2TOT", "VW0981XB1", "YN8295AO1", "25OHVITD1", "25OHVITD2", "25OHVITD3", "TESTOFREE", "TESTOSTERONE"   Endocrine Lab Results  Component Value Date   GLUCOSE 87 01/06/2023   GLUCOSEU Negative 07/15/2013   HGBA1C 5.6  10/15/2012   TSH 1.75 10/15/2012     Neuropathy Lab Results  Component Value Date   VITAMINB12 1,026 (H) 09/08/2018   FOLATE 26.0 06/08/2018   HGBA1C 5.6 10/15/2012     CNS No results found for: "COLORCSF", "APPEARCSF", "RBCCOUNTCSF", "WBCCSF", "POLYSCSF", "LYMPHSCSF", "EOSCSF", "PROTEINCSF", "GLUCCSF", "JCVIRUS", "CSFOLI", "IGGCSF", "LABACHR", "ACETBL"   Inflammation (CRP: Acute  ESR: Chronic) No results found for: "CRP", "ESRSEDRATE", "LATICACIDVEN"   Rheumatology No results found for: "RF", "ANA", "LABURIC", "URICUR", "LYMEIGGIGMAB", "LYMEABIGMQN", "HLAB27"    Coagulation Lab Results  Component Value Date   INR 1.1 01/02/2023   LABPROT 14.3 01/02/2023   APTT 95 (H) 01/05/2023   PLT 232 01/06/2023     Cardiovascular Lab Results  Component Value Date   BNP 226.2 (H) 01/01/2023   TROPONINI < 0.02 05/04/2014   HGB 13.5 01/06/2023   HCT 39.7 01/06/2023     Screening Lab Results  Component Value Date   SARSCOV2NAA NEGATIVE 01/01/2023   COVIDSOURCE NASOPHARYNGEAL 10/22/2018     Cancer No results found for: "CEA", "CA125", "LABCA2"   Allergens No results found for: "ALMOND", "APPLE", "ASPARAGUS", "AVOCADO", "BANANA", "BARLEY", "BASIL", "BAYLEAF", "GREENBEAN", "LIMABEAN", "WHITEBEAN", "BEEFIGE", "REDBEET", "BLUEBERRY", "BROCCOLI", "CABBAGE", "MELON", "CARROT", "CASEIN", "CASHEWNUT", "CAULIFLOWER", "CELERY"     Note: Lab results reviewed.  PFSH  Drug: Spencer Herring  reports no history of drug use. Alcohol:  reports current alcohol use. Tobacco:  reports that he quit smoking about 24 years ago. His smoking use included cigarettes. He started smoking about 72 years ago. He has a 144 pack-year smoking history. He has never used smokeless tobacco. Medical:  has a past medical history of Anemia, Asthma, COPD (chronic obstructive pulmonary disease) (HCC), Morbid obesity (HCC), Neuromuscular disorder (HCC), and Sleep apnea. Family: family history includes Heart disease in his father and paternal grandfather; Stroke in his mother.  Past Surgical History:  Procedure Laterality Date   BARIATRIC SURGERY N/A    COLONOSCOPY N/A 04/02/2015   Procedure: COLONOSCOPY;  Surgeon: Luella Sager, MD;  Location: [facility] ENDOSCOPY;  Service: Endoscopy;  Laterality: N/A;   COLONOSCOPY WITH PROPOFOL  N/A 12/01/2014   Procedure: COLONOSCOPY WITH PROPOFOL ;  Surgeon: Luella Sager, MD;  Location: [facility] ENDOSCOPY;  Service: Endoscopy;  Laterality: N/A;   COLONOSCOPY WITH PROPOFOL  N/A 10/27/2018   Procedure: COLONOSCOPY WITH PROPOFOL ;  Surgeon: Toledo, Alphonsus Jeans,  MD;  Location: [facility] ENDOSCOPY;  Service: Gastroenterology;  Laterality: N/A;   CORONARY STENT INTERVENTION N/A 12/01/2017   Procedure: CORONARY STENT INTERVENTION;  Surgeon: Percival Brace, MD;  Location: [facility] INVASIVE CV LAB;  Service: Cardiovascular;  Laterality: N/A;   CORONARY STENT INTERVENTION N/A 01/05/2023   Procedure: CORONARY STENT INTERVENTION;  Surgeon: Percival Brace, MD;  Location: [facility] INVASIVE CV LAB;  Service: Cardiovascular;  Laterality: N/A;   ESOPHAGOGASTRODUODENOSCOPY (EGD) WITH PROPOFOL  N/A 10/27/2018   Procedure: ESOPHAGOGASTRODUODENOSCOPY (EGD) WITH PROPOFOL ;  Surgeon: Toledo, Alphonsus Jeans, MD;  Location: [facility] ENDOSCOPY;  Service: Gastroenterology;  Laterality: N/A;   LEFT HEART CATH AND CORONARY ANGIOGRAPHY Left 12/01/2017   Procedure: LEFT HEART CATH AND CORONARY ANGIOGRAPHY;  Surgeon: Michelle Aid, MD;  Location: [facility] INVASIVE CV LAB;  Service: Cardiovascular;  Laterality: Left;   LEFT HEART CATH AND CORONARY ANGIOGRAPHY N/A 01/05/2023   Procedure: LEFT HEART CATH AND CORONARY ANGIOGRAPHY;  Surgeon: Percival Brace, MD;  Location: [facility] INVASIVE CV LAB;  Service: Cardiovascular;  Laterality: N/A;   LOWER EXTREMITY ANGIOGRAPHY Right 12/17/2017   Procedure: LOWER EXTREMITY ANGIOGRAPHY;  Surgeon: Celso College, MD;  Location: [facility] INVASIVE  CV LAB;  Service: Cardiovascular;  Laterality: Right;   PNEUMONECTOMY Left    Active Ambulatory Problems    Diagnosis Date Noted   Angina decubitus (HCC) 11/24/2017   CAD (coronary artery disease) 12/01/2017   Morbid obesity (HCC) 12/15/2017   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) 12/15/2017   Bilateral lower extremity edema 01/14/2018   Varicose veins of bilateral lower extremities with pain 01/14/2018   Iron deficiency anemia following bariatric surgery 06/08/2018   B12 deficiency 03/16/2019   Acute exacerbation of CHF (congestive heart failure) (HCC) 01/01/2023   Hyperlipidemia 01/01/2023   Morbid  obesity with BMI of 40.0-44.9, adult (HCC) 08/30/2023   Abdominal pain 08/30/2023   Anemia 01/23/2015   Ankle pain 08/23/2018   Centrilobular emphysema (HCC) 08/31/2015   DJD (degenerative joint disease) 08/23/2018   Essential hypertension 01/26/2018   H/O bariatric surgery 06/17/2019   HFrEF (heart failure with reduced ejection fraction) (HCC) 02/05/2023   Localized edema 08/23/2018   OSA (obstructive sleep apnea) 08/30/2023   Osteoarthritis of ankle 08/23/2018   Plantar fasciitis 08/31/2015   Primary osteoarthritis involving multiple joints 09/04/2020   Swelling of ankle joint 08/17/2018   Tibialis posterior tendinitis 08/23/2018   Umbilical hernia, incarcerated 11/02/2017   Stable angina pectoris (HCC) 11/03/2017   Coronary atherosclerosis of native coronary artery 12/10/2017   Hyperlipidemia, mixed 11/03/2017   Resolved Ambulatory Problems    Diagnosis Date Noted   No Resolved Ambulatory Problems   Past Medical History:  Diagnosis Date   Asthma    COPD (chronic obstructive pulmonary disease) (HCC)    Neuromuscular disorder (HCC)    Sleep apnea    Constitutional Exam  General appearance: Well nourished, well developed, and well hydrated. In no apparent acute distress There were no vitals filed for this visit. BMI Assessment: Estimated body mass index is 36.65 kg/m as calculated from the following:   Height as of 01/01/23: 5\' 4"  (1.626 m).   Weight as of 01/05/23: 213 lb 8 oz (96.8 kg).  BMI interpretation table: BMI level Category Range association with higher incidence of chronic pain  <18 kg/m2 Underweight   18.5-24.9 kg/m2 Ideal body weight   25-29.9 kg/m2 Overweight Increased incidence by 20%  30-34.9 kg/m2 Obese (Class I) Increased incidence by 68%  35-39.9 kg/m2 Severe obesity (Class II) Increased incidence by 136%  >40 kg/m2 Extreme obesity (Class III) Increased incidence by 254%   Patient's current BMI Ideal Body weight  There is no height or weight on file to  calculate BMI. Patient weight not recorded   BMI Readings from Last 4 Encounters:  01/05/23 36.65 kg/m  07/26/21 39.48 kg/m  04/30/20 38.37 kg/m  03/09/19 36.36 kg/m   Wt Readings from Last 4 Encounters:  01/05/23 213 lb 8 oz (96.8 kg)  07/26/21 230 lb (104.3 kg)  04/30/20 230 lb 9.6 oz (104.6 kg)  03/09/19 232 lb 2.3 oz (105.3 kg)    Psych/Mental status: Alert, oriented x 3 (person, place, & time)       Eyes: PERLA Respiratory: No evidence of acute respiratory distress  Assessment  Primary Diagnosis & Pertinent Problem List: There were no encounter diagnoses.  Visit Diagnosis (New problems to examiner): No diagnosis found. Plan of Care (Initial workup plan)  Note: Spencer Herring was reminded that as per protocol, today's visit has been an evaluation only. We have not taken over the patient's controlled substance management.  Problem-specific plan: Assessment and Plan  Lab Orders  No laboratory test(s) ordered today   Imaging Orders  No imaging studies ordered today   Referral Orders  No referral(s) requested today   Procedure Orders    No procedure(s) ordered today   Pharmacotherapy (current): Medications ordered:  No orders of the defined types were placed in this encounter.  Medications administered during this visit: Shenouda R. Wahlert had no medications administered during this visit.   Analgesic Pharmacotherapy:  Opioid Analgesics: For patients currently taking or requesting to take opioid analgesics, in accordance with [place]  Medical Board Guidelines, we will assess their risks and indications for the use of these substances. After completing our evaluation, we may offer recommendations, but we no longer take patients for medication management. The prescribing physician will ultimately decide, based on his/her training and level of comfort whether to adopt any of the recommendations, including whether or not to prescribe such medicines.   Membrane stabilizer: To be determined at a later time  Muscle relaxant: To be determined at a later time  NSAID: To be determined at a later time  Other analgesic(s): To be determined at a later time   Interventional management options: Spencer Herring was informed that there is no guarantee that he would be a candidate for interventional therapies. The decision will be based on the results of diagnostic studies, as well as Spencer Herring's risk profile.  Procedure(s) under consideration:  Pending results of ordered studies     Interventional Therapies  Risk Factors  Considerations  Medical Comorbidities:     Planned  Pending:      Under consideration:   Pending   Completed:   None at this time   Therapeutic  Palliative (PRN) options:   None established   Completed by other providers:   None reported     Provider-requested follow-up: No follow-ups on file.  Future Appointments  Date Time Provider Department Center  08/31/2023  2:40 PM Renaldo Caroli, MD [facility] None   I discussed the assessment and treatment plan with the patient. The patient was provided an opportunity to ask questions and all were answered. The patient agreed with the plan and demonstrated an understanding of the instructions.  Patient advised to call back or seek an in-person evaluation if the symptoms or condition worsens.  Duration of encounter: *** minutes.  Total time on encounter, as per AMA guidelines included both the face-to-face and non-face-to-face time personally spent by the physician and/or other qualified health care professional(s) on the day of the encounter (includes time in activities that require the physician or other qualified health care professional and does not include time in activities normally performed by clinical staff). Physician's time may include the following activities when performed: Preparing to see the patient (e.g., pre-charting review of records, searching for  previously ordered imaging, lab work, and nerve conduction tests) Review of prior analgesic pharmacotherapies. Reviewing PMP Interpreting ordered tests (e.g., lab work, imaging, nerve conduction tests) Performing post-procedure evaluations, including interpretation of diagnostic procedures Obtaining and/or reviewing separately obtained history Performing a medically appropriate examination and/or evaluation Counseling and educating the patient/family/caregiver Ordering medications, tests, or procedures Referring and communicating with other health care professionals (when not separately reported) Documenting clinical information in the electronic or other health record Independently interpreting results (not separately reported) and communicating results to the patient/ family/caregiver Care coordination (not separately reported)  Note by: Candi Chafe, MD (TTS and AI technology used. I apologize for any typographical errors that were not detected and corrected.)  Date: 08/31/2023; Time: 10:43 AM

## 2023-08-31 ENCOUNTER — Ambulatory Visit
Admission: RE | Admit: 2023-08-31 | Discharge: 2023-08-31 | Disposition: A | Discharge status: Home / Self Care | Source: Ambulatory Visit | Attending: Pain Medicine | Admitting: Pain Medicine

## 2023-08-31 ENCOUNTER — Ambulatory Visit: Payer: Self-pay | Admitting: Pain Medicine

## 2023-08-31 ENCOUNTER — Encounter: Payer: Self-pay | Admitting: Pain Medicine

## 2023-08-31 ENCOUNTER — Ambulatory Visit
Admission: RE | Admit: 2023-08-31 | Discharge: 2023-08-31 | Disposition: A | Discharge status: Home / Self Care | Attending: Pain Medicine | Admitting: Pain Medicine

## 2023-08-31 VITALS — BP 116/86 | HR 91 | Temp 97.3°F | Resp 16 | Ht 62.0 in | Wt 212.0 lb

## 2023-08-31 DIAGNOSIS — G8929 Other chronic pain: Secondary | ICD-10-CM

## 2023-08-31 DIAGNOSIS — M25511 Pain in right shoulder: Secondary | ICD-10-CM | POA: Insufficient documentation

## 2023-08-31 DIAGNOSIS — M150 Primary generalized (osteo)arthritis: Secondary | ICD-10-CM

## 2023-08-31 DIAGNOSIS — M25561 Pain in right knee: Secondary | ICD-10-CM

## 2023-08-31 DIAGNOSIS — I4819 Other persistent atrial fibrillation: Secondary | ICD-10-CM | POA: Insufficient documentation

## 2023-08-31 DIAGNOSIS — M25571 Pain in right ankle and joints of right foot: Secondary | ICD-10-CM | POA: Diagnosis not present

## 2023-08-31 DIAGNOSIS — Z79899 Other long term (current) drug therapy: Secondary | ICD-10-CM | POA: Insufficient documentation

## 2023-08-31 DIAGNOSIS — M25512 Pain in left shoulder: Secondary | ICD-10-CM

## 2023-08-31 DIAGNOSIS — S92902S Unspecified fracture of left foot, sequela: Secondary | ICD-10-CM | POA: Insufficient documentation

## 2023-08-31 DIAGNOSIS — M25572 Pain in left ankle and joints of left foot: Secondary | ICD-10-CM

## 2023-08-31 DIAGNOSIS — M79671 Pain in right foot: Secondary | ICD-10-CM | POA: Insufficient documentation

## 2023-08-31 DIAGNOSIS — G894 Chronic pain syndrome: Secondary | ICD-10-CM | POA: Insufficient documentation

## 2023-08-31 DIAGNOSIS — Z7901 Long term (current) use of anticoagulants: Secondary | ICD-10-CM | POA: Insufficient documentation

## 2023-08-31 DIAGNOSIS — Z789 Other specified health status: Secondary | ICD-10-CM | POA: Insufficient documentation

## 2023-08-31 DIAGNOSIS — M79672 Pain in left foot: Secondary | ICD-10-CM | POA: Insufficient documentation

## 2023-08-31 DIAGNOSIS — M899 Disorder of bone, unspecified: Secondary | ICD-10-CM | POA: Insufficient documentation

## 2023-08-31 NOTE — Patient Instructions (Addendum)
 ______________________________________________________________________    New Patients  Welcome to [facility].   Initial Visit The first or initial visit consists of an evaluation only.   Interventional pain management.  We offer therapies other than opioid controlled substances to manage chronic pain. These include, but are not limited to, diagnostic, therapeutic, and palliative specialized injection therapies (i.e.: Epidural Steroids, Facet Blocks, etc.). We specialize in a variety of nerve blocks as well as radiofrequency treatments. We offer pain implant evaluations and trials, as well as follow up management. In addition we also provide a variety joint injections, including Viscosupplementation (AKA: Gel Therapy).  Prescription Pain Medication. We specialize in alternatives to opioids. We can provide evaluations and recommendations for/of pharmacologic therapies based on CDC Guidelines.  We no longer take patients for long-term medication management. We will not be taking over your pain medications.  ______________________________________________________________________      ______________________________________________________________________    Patient Information update  To: All of our patients.  Re: Name change.  It has been made official that our current name, "[facility]"   will soon be changed to "[facility]".   The purpose of this change is to eliminate any confusion created by the concept of our practice being a "Medication Management Pain Clinic". In the past this has led to the misconception that we treat pain primarily by the use of prescription medications.  Nothing can be farther from the truth.   Understanding PAIN MANAGEMENT: To further understand what our practice does, you first have to  understand that "Pain Management" is a subspecialty that requires additional training once a physician has completed their specialty training, which can be in either Anesthesia, Neurology, Psychiatry, or Physical Medicine and Rehabilitation (PMR). Each one of these contributes to the final approach taken by each physician to the management of their patient's pain. To be a "Pain Management Specialist" you must have first completed one of the specialty trainings below.  Anesthesiologists - trained in clinical pharmacology and interventional techniques such as nerve blockade and regional as well as central neuroanatomy. They are trained to block pain before, during, and after surgical interventions.  Neurologists - trained in the diagnosis and pharmacological treatment of complex neurological conditions, such as Multiple Sclerosis, Parkinson's, spinal cord injuries, and other systemic conditions that may be associated with symptoms that may include but are not limited to pain. They tend to rely primarily on the treatment of chronic pain using prescription medications.  Psychiatrist - trained in conditions affecting the psychosocial wellbeing of patients including but not limited to depression, anxiety, schizophrenia, personality disorders, addiction, and other substance use disorders that may be associated with chronic pain. They tend to rely primarily on the treatment of chronic pain using prescription medications.   Physical Medicine and Rehabilitation (PMR) physicians, also known as physiatrists - trained to treat a wide variety of medical conditions affecting the brain, spinal cord, nerves, bones, joints, ligaments, muscles, and tendons. Their training is primarily aimed at treating patients that have suffered injuries that have caused severe physical impairment. Their training is primarily aimed at the physical therapy and rehabilitation of those patients. They may also work alongside orthopedic surgeons  or neurosurgeons using their expertise in assisting surgical patients to recover after their surgeries.  INTERVENTIONAL PAIN MANAGEMENT is sub-subspecialty of Pain Management.  Our physicians are Board-certified in Anesthesia, Pain Management, and Interventional Pain Management.  This meaning that  not only have they been trained and Board-certified in their specialty of Anesthesia, and subspecialty of Pain Management, but they have also received further training in the sub-subspecialty of Interventional Pain Management, in order to become Board-certified as INTERVENTIONAL PAIN MANAGEMENT SPECIALIST.    Mission: Our goal is to use our skills in  INTERVENTIONAL PAIN MANAGEMENT as alternatives to the chronic use of prescription opioid medications for the treatment of pain. To make this more clear, we have changed our name to reflect what we do and offer. We will continue to offer medication management assessment and recommendations, but we will not be taking over any patient's medication management.  ______________________________________________________________________      ______________________________________________________________________    Blood Thinners  IMPORTANT NOTICE:  If you take any of these, make sure to notify the nursing staff.  Failure to do so may result in serious injury.  Recommended time intervals to stop and restart blood-thinners, before & after invasive procedures  Generic Name Brand Name Pre-procedure: Stop medication for this amount of time before your procedure: Post-procedure: Wait this amount of time after the procedure before restarting your medication:  Abciximab Reopro 15 days 2 hrs  Alteplase Activase 10 days 10 days  Anagrelide Agrylin    Apixaban  Eliquis  3 days 6 hrs  Cilostazol Pletal 3 days 5 hrs  Clopidogrel  Plavix  7-10 days 2 hrs  Dabigatran Pradaxa 5 days 6 hrs  Dalteparin Fragmin 24 hours 4 hrs  Dipyridamole Aggrenox 11days 2 hrs  Edoxaban  Lixiana; Savaysa 3 days 2 hrs  Enoxaparin   Lovenox  24 hours 4 hrs  Eptifibatide Integrillin 8 hours 2 hrs  Fondaparinux  Arixtra 72 hours 12 hrs  Hydroxychloroquine Plaquenil 11 days   Prasugrel Effient 7-10 days 6 hrs  Reteplase Retavase 10 days 10 days  Rivaroxaban Xarelto 3 days 6 hrs  Ticagrelor Brilinta 5-7 days 6 hrs  Ticlopidine Ticlid 10-14 days 2 hrs  Tinzaparin Innohep 24 hours 4 hrs  Tirofiban Aggrastat 8 hours 2 hrs  Warfarin Coumadin 5 days 2 hrs   Other medications with blood-thinning effects  NOTE: Consider stopping these if you have prolonged bleeding despite not taking any of the above blood thinners. Otherwise ask your provider and this will be decided on a case-by-case basis.  Product indications Generic (Brand) names Note  Cholesterol Lipitor  Stop 4 days before procedure  Blood thinner (injectable) Heparin  (LMW or LMWH Heparin ) Stop 24 hours before procedure  Cancer Ibrutinib (Imbruvica) Stop 7 days before procedure  Malaria/Rheumatoid Hydroxychloroquine (Plaquenil) Stop 11 days before procedure  Thrombolytics  10 days before or after procedures   Over-the-counter (OTC) Products with blood-thinning effects  NOTE: Consider stopping these if you have prolonged bleeding despite not taking any of the above blood thinners. Otherwise ask your provider and this will be decided on a case-by-case basis.  Product Common names Stop Time  Aspirin  > 325 mg Goody Powders, Excedrin, etc. 11 days  Aspirin  <= 81 mg  7 days  Fish oil  4 days  Garlic supplements  7 days  Ginkgo biloba  36 hours  Ginseng  24 hours  NSAIDs Ibuprofen, Naprosyn, etc. 3 days  Vitamin E  4 days   ______________________________________________________________________

## 2023-08-31 NOTE — Progress Notes (Signed)
 Safety precautions to be maintained throughout the outpatient stay will include: orient to surroundings, keep bed in low position, maintain call bell within reach at all times, provide assistance with transfer out of bed and ambulation.

## 2023-09-03 LAB — COMPLIANCE DRUG ANALYSIS, UR

## 2023-09-07 ENCOUNTER — Other Ambulatory Visit: Payer: Self-pay | Admitting: Gastroenterology

## 2023-09-07 DIAGNOSIS — R1319 Other dysphagia: Secondary | ICD-10-CM

## 2023-09-09 LAB — SEDIMENTATION RATE: Sed Rate: 10 mm/h (ref 0–30)

## 2023-09-09 LAB — COMP. METABOLIC PANEL (12)
AST: 14 IU/L (ref 0–40)
Albumin: 4.5 g/dL (ref 3.9–4.9)
Alkaline Phosphatase: 107 IU/L (ref 44–121)
BUN/Creatinine Ratio: 29 — ABNORMAL HIGH (ref 10–24)
BUN: 29 mg/dL — ABNORMAL HIGH (ref 8–27)
Bilirubin Total: 0.3 mg/dL (ref 0.0–1.2)
Calcium: 10 mg/dL (ref 8.6–10.2)
Chloride: 99 mmol/L (ref 96–106)
Creatinine, Ser: 1.01 mg/dL (ref 0.76–1.27)
Globulin, Total: 2.4 g/dL (ref 1.5–4.5)
Glucose: 91 mg/dL (ref 70–99)
Potassium: 5.4 mmol/L — ABNORMAL HIGH (ref 3.5–5.2)
Sodium: 138 mmol/L (ref 134–144)
Total Protein: 6.9 g/dL (ref 6.0–8.5)
eGFR: 83 mL/min/{1.73_m2} (ref >59)

## 2023-09-09 LAB — VITAMIN B12: Vitamin B-12: 574 pg/mL (ref 232–1245)

## 2023-09-09 LAB — 25-HYDROXY VITAMIN D LCMS D2+D3
25-Hydroxy, Vitamin D-2: <1 ng/mL
25-Hydroxy, Vitamin D-3: 34 ng/mL
25-Hydroxy, Vitamin D: 34 ng/mL

## 2023-09-09 LAB — C-REACTIVE PROTEIN: CRP: 3 mg/L (ref 0–10)

## 2023-09-09 LAB — MAGNESIUM: Magnesium: 2.4 mg/dL — ABNORMAL HIGH (ref 1.6–2.3)

## 2023-09-11 ENCOUNTER — Ambulatory Visit
Admission: RE | Admit: 2023-09-11 | Discharge: 2023-09-11 | Disposition: A | Discharge status: Home / Self Care | Source: Ambulatory Visit | Attending: Gastroenterology | Admitting: Gastroenterology

## 2023-09-11 ENCOUNTER — Other Ambulatory Visit: Payer: Self-pay | Admitting: Gastroenterology

## 2023-09-11 DIAGNOSIS — R1319 Other dysphagia: Secondary | ICD-10-CM

## 2023-09-16 ENCOUNTER — Other Ambulatory Visit: Payer: Self-pay | Admitting: Gastroenterology

## 2023-09-16 DIAGNOSIS — R1319 Other dysphagia: Secondary | ICD-10-CM

## 2023-09-16 DIAGNOSIS — T17900A Unspecified foreign body in respiratory tract, part unspecified causing asphyxiation, initial encounter: Secondary | ICD-10-CM

## 2023-09-24 ENCOUNTER — Encounter: Payer: Self-pay | Admitting: Anesthesiology

## 2023-09-24 ENCOUNTER — Ambulatory Visit: Admission: RE | Admit: 2023-09-24 | Source: Home / Self Care | Admitting: Cardiovascular Disease

## 2023-09-24 ENCOUNTER — Encounter: Admission: RE | Payer: Self-pay | Source: Home / Self Care

## 2023-09-24 DIAGNOSIS — I4819 Other persistent atrial fibrillation: Secondary | ICD-10-CM

## 2023-09-24 SURGERY — CARDIOVERSION
Anesthesia: General

## 2023-09-24 MED ORDER — PENTAFLUOROPROP-TETRAFLUOROETH EX AERO
INHALATION_SPRAY | CUTANEOUS | Status: AC
  Filled 2023-09-24: qty 30

## 2023-09-24 MED ORDER — EPHEDRINE 5 MG/ML INJ
INTRAVENOUS | Status: AC
  Filled 2023-09-24: qty 5

## 2023-09-24 MED ORDER — PROPOFOL 10 MG/ML IV BOLUS
INTRAVENOUS | Status: AC
Start: 2023-09-24 — End: ?
  Filled 2023-09-24: qty 20

## 2023-09-25 ENCOUNTER — Ambulatory Visit
Admission: RE | Admit: 2023-09-25 | Discharge: 2023-09-25 | Disposition: A | Discharge status: Home / Self Care | Source: Ambulatory Visit | Attending: Gastroenterology | Admitting: Gastroenterology

## 2023-09-25 DIAGNOSIS — R1319 Other dysphagia: Secondary | ICD-10-CM | POA: Diagnosis present

## 2023-09-25 DIAGNOSIS — T17900A Unspecified foreign body in respiratory tract, part unspecified causing asphyxiation, initial encounter: Secondary | ICD-10-CM | POA: Insufficient documentation

## 2023-09-25 MED ORDER — PENTAFLUOROPROP-TETRAFLUOROETH EX AERO
INHALATION_SPRAY | CUTANEOUS | Status: AC
  Filled 2023-09-25: qty 30

## 2023-09-25 NOTE — Procedures (Signed)
 Modified Barium Swallow Study  Patient Details  Name: Spencer Herring MRN: [id number] Date of Birth: 12/03/1955  Today's Date: 09/25/2023  Modified Barium Swallow completed.  Full report located under Chart Review in the Imaging Section.  History of Present Illness Mr. Westrup is a 68 y.o.male patient his PMH of CAD (s/p PCI and stent prox LAD 11/2017, s/p PCI and stent to OM2 01/2018, s/p PCI and stent to mid RCA 12/2022), HFrEF w/ EF 25-35% 12/2022, improved to 50% 03/2023 with GDMT, paroxysmal afib (onset 12/2022) on eliquis , HTN, HLD, OSA. Patient is here to establish care with general cardiology. Follows with advanced heart failure regularly last seen by Dr. Floria Hurst 03/31/2023. Hx of hospitalization 12/2022 for SOB, fatigue, edema. Echo 01/02/23 showed severe LV dysfunction EF 20-25% with global hypokinesis. Underwent LHC 01/05/23 showing patent prior stents to LAD and OM2, PCI and stent to mid RCA lesion during this catheterization. Had new onset a-fib during hospitalization, discharged on carvedilol , eliquis . Discharged on triple therapy with eliquis , asa, plavix  for 2-3 weeks then d/c asa.He needs to be scheduled for an endoscopy and colonoscopy but there is concern about interrupting his Eliquis  and Plavix  regimen due to the recent stent placement.   In addition,  he has noticed over the past 82-months dealing with solid food and liquid esophageal dysphagia episodes localized to level of xiphoid process. He feels like episodes have evenly between solids and liquids. He feels like things are hanging up at level of xiphoid process and takes >30 seconds to pass. There is no issue with pill dysphagia. He feels like GERD symptoms are well-controlled on once daily PPI. He has had dysphagia for years. Last EGD in 2020 showed no endoscopic abnormality to explain his complaint of dysphagia and empiric dilatation was not performed at this time. He has seed the [facility] failure clinic at [facility]. He is on Eliquis   and Clopidogrel  daily. He had stent placed in Sept 2024 by Dr. Parks Bollman to his RCA. He is on Eliquis  due to new diagnosis of atrial fibrillation with RVR back in September before heart cath. He is not sure how long he is going to be on Clopidogrel .  DG Esophagus 09/11/2023  FINDINGS: Swallowing: Laryngeal penetration with silent aspiration noted on initial lateral swallows which cleared when prompted to cough. Evaluation subsequently aborted.  Clinical Impression  Pt continues to report globus sensation as described above that is intermittent and resolved with regurgitation. He states that it can occur with both solids and liquids but not whole pills.   Pt presents with mild pharyngeal phase dysphagia that appears to be sensorimotor in nature resulting in intermittent deep penetration and 1 instance of silent aspiration when consuming thin liquids via cup and nectar thick liquids. Specifically, he has delayed swallow initiation to level of pyriform sinuses and decreased base of tongue strength. Pt's silent aspiration of thin liquid bolus occured prior to swallow initiation with bolus falling over base of tongue into airway.   Pt with no report of bolus sensation throughout study. While he did have a mildly wet cough that was present during th study, it was not in correlation to boluses presented. Initial findings and video of the study were provided to pt with follow up recommendation to go over aspiration precautions and possible target base of tongue strengthening. Pt agreeable.   Factors that may increase risk of adverse event in presence of aspiration Roderick Civatte & Jessy Morocco 2021):  GERD  Swallow Evaluation Recommendations Recommendations: PO diet PO Diet Recommendation:  Regular;Thin liquids (Level 0) Liquid Administration via: Cup;Straw Medication Administration: Whole meds with liquid    Kenyetta Fife B. Garlin Junker, M.S., CCC-SLP, Tree surgeon Certified Brain Injury  Specialist [facility]  [facility] Office [phone] Ascom [phone] Fax [phone]

## 2023-09-28 ENCOUNTER — Ambulatory Visit: Admitting: Pain Medicine

## 2023-09-29 ENCOUNTER — Ambulatory Visit: Attending: Gastroenterology | Admitting: Speech Pathology

## 2023-09-29 DIAGNOSIS — R1313 Dysphagia, pharyngeal phase: Secondary | ICD-10-CM | POA: Diagnosis present

## 2023-09-29 DIAGNOSIS — R1312 Dysphagia, oropharyngeal phase: Secondary | ICD-10-CM | POA: Diagnosis present

## 2023-09-29 NOTE — Therapy (Unsigned)
 OUTPATIENT SPEECH LANGUAGE PATHOLOGY  SWALLOW EVALUATION   Patient Name: Spencer Herring MRN: [id number] DOB:11-12-1955, 68 y.o., male Today's Date: 09/29/2023  PCP: Spencer Baumgarten, MD REFERRING PROVIDER: Landa Pine, PA   End of Session - 09/29/23 1716     Visit Number 1    Number of Visits 17    Date for SLP Re-Evaluation 11/24/23    Authorization Type [employer]    Progress Note Due on Visit 10    SLP Start Time 1530    SLP Stop Time  1600    SLP Time Calculation (min) 30 min    Activity Tolerance Patient tolerated treatment well             Past Medical History:  Diagnosis Date   Anemia    Asthma    COPD (chronic obstructive pulmonary disease) (HCC)    Morbid obesity (HCC)    Neuromuscular disorder (HCC)    Sleep apnea    Past Surgical History:  Procedure Laterality Date   BARIATRIC SURGERY N/A    COLONOSCOPY N/A 04/02/2015   Procedure: COLONOSCOPY;  Surgeon: Spencer Sager, MD;  Location: [facility] ENDOSCOPY;  Service: Endoscopy;  Laterality: N/A;   COLONOSCOPY WITH PROPOFOL  N/A 12/01/2014   Procedure: COLONOSCOPY WITH PROPOFOL ;  Surgeon: Spencer Sager, MD;  Location: [facility] ENDOSCOPY;  Service: Endoscopy;  Laterality: N/A;   COLONOSCOPY WITH PROPOFOL  N/A 10/27/2018   Procedure: COLONOSCOPY WITH PROPOFOL ;  Surgeon: Toledo, Alphonsus Jeans, MD;  Location: [facility] ENDOSCOPY;  Service: Gastroenterology;  Laterality: N/A;   CORONARY STENT INTERVENTION N/A 12/01/2017   Procedure: CORONARY STENT INTERVENTION;  Surgeon: Spencer Brace, MD;  Location: [facility] INVASIVE CV LAB;  Service: Cardiovascular;  Laterality: N/A;   CORONARY STENT INTERVENTION N/A 01/05/2023   Procedure: CORONARY STENT INTERVENTION;  Surgeon: Spencer Brace, MD;  Location: [facility] INVASIVE CV LAB;  Service: Cardiovascular;  Laterality: N/A;   ESOPHAGOGASTRODUODENOSCOPY (EGD) WITH PROPOFOL  N/A 10/27/2018   Procedure: ESOPHAGOGASTRODUODENOSCOPY (EGD) WITH PROPOFOL ;  Surgeon: Toledo,  Alphonsus Jeans, MD;  Location: [facility] ENDOSCOPY;  Service: Gastroenterology;  Laterality: N/A;   LEFT HEART CATH AND CORONARY ANGIOGRAPHY Left 12/01/2017   Procedure: LEFT HEART CATH AND CORONARY ANGIOGRAPHY;  Surgeon: Spencer Aid, MD;  Location: [facility] INVASIVE CV LAB;  Service: Cardiovascular;  Laterality: Left;   LEFT HEART CATH AND CORONARY ANGIOGRAPHY N/A 01/05/2023   Procedure: LEFT HEART CATH AND CORONARY ANGIOGRAPHY;  Surgeon: Spencer Brace, MD;  Location: [facility] INVASIVE CV LAB;  Service: Cardiovascular;  Laterality: N/A;   LOWER EXTREMITY ANGIOGRAPHY Right 12/17/2017   Procedure: LOWER EXTREMITY ANGIOGRAPHY;  Surgeon: Spencer College, MD;  Location: [facility] INVASIVE CV LAB;  Service: Cardiovascular;  Laterality: Right;   PNEUMONECTOMY Left    Patient Active Problem List   Diagnosis Date Noted   Chronic pain syndrome 08/31/2023   Pharmacologic therapy 08/31/2023   Disorder of skeletal system 08/31/2023   Problems influencing health status 08/31/2023   Persistent atrial fibrillation (HCC) 08/31/2023   Chronic anticoagulation (Eliquis  + Plavix ) 08/31/2023   Chronic foot pain (1ry area of Pain) (Bilateral) (L>R) 08/31/2023   Chronic knee pain (2ry area of Pain) (Right) 08/31/2023   Chronic shoulder pain (3ry area of Pain) (Bilateral) (R>L) 08/31/2023   Foot fracture, sequela (Left) 08/31/2023   Morbid obesity with BMI of 40.0-44.9, adult (HCC) 08/30/2023   Abdominal pain 08/30/2023   OSA (obstructive sleep apnea) 08/30/2023   HFrEF (heart failure with reduced ejection fraction) (HCC) 02/05/2023   Acute exacerbation of CHF (congestive  heart failure) (HCC) 01/01/2023   Hyperlipidemia 01/01/2023   Primary osteoarthritis involving multiple joints 09/04/2020   H/O bariatric surgery 06/17/2019   B12 deficiency 03/16/2019   Chronic ankle pain (1ry area of Pain) (Bilateral) 08/23/2018   DJD (degenerative joint disease) 08/23/2018   Localized edema 08/23/2018   Osteoarthritis of ankle 08/23/2018    Tibialis posterior tendinitis 08/23/2018   Swelling of ankle joint 08/17/2018   Iron deficiency anemia following bariatric surgery 06/08/2018   Essential hypertension 01/26/2018   Bilateral lower extremity edema 01/14/2018   Varicose veins of bilateral lower extremities with pain 01/14/2018   Morbid obesity (HCC) 12/15/2017   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) 12/15/2017   Coronary atherosclerosis of native coronary artery 12/10/2017   CAD (coronary artery disease) 12/01/2017   Angina decubitus (HCC) 11/24/2017   Stable angina pectoris (HCC) 11/03/2017   Hyperlipidemia, mixed 11/03/2017   Umbilical hernia, incarcerated 11/02/2017   Centrilobular emphysema (HCC) 08/31/2015   Plantar fasciitis 08/31/2015   Anemia 01/23/2015    ONSET DATE: aspiration initially identified on barium swallow on 05/16/225; Modified Barium Swallow on 09/25/2023 date of this referral 09/29/2023  REFERRING DIAG: R13.12 (ICD-10-CM) - Oropharyngeal dysphagia   THERAPY DIAG:  Dysphagia, pharyngeal phase  Rationale for Evaluation and Treatment Rehabilitation  SUBJECTIVE:   SUBJECTIVE STATEMENT: Pt known to this writer from Modified Barium Swallow Study on 09/25/2023 Pt accompanied by: self  PERTINENT HISTORY and DIAGNOSTIC FINDINGS:  Mr. Spencer Herring is a 68 y.o.male patient his PMH of CAD (s/p PCI and stent prox LAD 11/2017, s/p PCI and stent to OM2 01/2018, s/p PCI and stent to mid RCA 12/2022), HFrEF w/ EF 25-35% 12/2022, improved to 50% 03/2023 with GDMT, paroxysmal afib (onset 12/2022) on eliquis , HTN, HLD, OSA. Patient is here to establish care with general cardiology. Follows with advanced heart failure regularly last seen by Dr. Floria Herring 03/31/2023. Hx of hospitalization 12/2022 for SOB, fatigue, edema. Echo 01/02/23 showed severe LV dysfunction EF 20-25% with global hypokinesis. Underwent LHC 01/05/23 showing patent prior stents to LAD and OM2, PCI and stent to mid RCA lesion during this  catheterization. Had new onset a-fib during hospitalization, discharged on carvedilol , eliquis . Discharged on triple therapy with eliquis , asa, plavix  for 2-3 weeks then d/c asa.He needs to be scheduled for an endoscopy and colonoscopy but there is concern about interrupting his Eliquis  and Plavix  regimen due to the recent stent placement.    In addition,  he has noticed over the past 84-months dealing with solid food and liquid esophageal dysphagia episodes localized to level of xiphoid process. He feels like episodes have evenly between solids and liquids. He feels like things are hanging up at level of xiphoid process and takes >30 seconds to pass. There is no issue with pill dysphagia. He feels like GERD symptoms are well-controlled on once daily PPI. He has had dysphagia for years. Last EGD in 2020 showed no endoscopic abnormality to explain his complaint of dysphagia and empiric dilatation was not performed at this time. He has seed the heart failure clinic at [facility] He is on Eliquis  and Clopidogrel  daily. He had stent placed in Sept 2024 by Dr. Parks Bollman to his RCA. He is on Eliquis  due to new diagnosis of atrial fibrillation with RVR back in September before heart cath. He is not sure how long he is going to be on Clopidogrel .   DG Esophagus 09/11/2023  FINDINGS: Swallowing: Laryngeal penetration with silent aspiration noted on initial lateral swallows which cleared when prompted  to cough. Evaluation subsequently aborted.  MBSS 09/25/2023 Pt presents with mild pharyngeal phase dysphagia that appears to be sensorimotor in nature resulting in intermittent deep penetration and 1 instance of silent aspiration when consuming thin liquids via cup and nectar thick liquids. Specifically, he has delayed swallow initiation to level of pyriform sinuses and decreased base of tongue strength. Pt's silent aspiration of thin liquid bolus occured prior to swallow initiation with bolus falling over base of  tongue into airway.    PAIN:  Are you having pain? No  FALLS: Has patient fallen in last 6 months?  No  LIVING ENVIRONMENT: Lives with: lives with their family Lives in: House/apartment  PLOF:  Level of assistance: Independent with ADLs, Independent with IADLs Employment: Full-time employment   PATIENT GOALS  to improve swallow functions  OBJECTIVE:  RECOMMENDATIONS FROM OBJECTIVE SWALLOW STUDY (MBSS/FEES):  09/25/2023 Objective swallow impairments: mild pharyngeal phase dysphagia; sensorimotor - deep penetration and silent aspiration x 1 with thin liquids and nectar thick liquids  COGNITION: Overall cognitive status: Within functional limits for tasks assessed  ORAL MOTOR EXAMINATION Facial : WFL Lingual: WFL Velum: WFL Mandible: WFL Cough: WFL Voice: WFL   CLINICAL SWALLOW ASSESSMENT:   Current diet: regular and thin liquids Dentition: adequate natural dentition Feeding: able to feed self Consistencies tested: Thin Liquid: Presentation: Cup and Self-fed Oral Phase: WFL Pharyngeal Phase: WFL Regular: Presentation: By hand Oral Phase: WFL Pharyngeal Phase: WFL   Evaluation findings: Pt with confirmed silent aspiration of thin liquids  Aspiration risk factors:History of GERD and History of esophageal-related issues Overall aspiration risk:Mild Diet Recommendations: regular and thin liquids Precautions:Minimize environmental distractions, Slow rate, Small sips/bites, Seated upright 90 degrees, and Remain upright for at least 30 minutes after meals Supervision: Patient able to feed self Oral care recommendations:Oral care BID Follow-up recommendations: Therapy as outlined in treatment plan below     PATIENT REPORTED OUTCOME MEASURES (PROM): Reflux Symptom Index These are statements many people have used to describe their voices and the effects of their voices on their lives. In the last  month, how did the following problems affect you?   Hoarseness or a  problem with your voice 2=Moderate or slight problem  Clearing your throat 3+Moderate problem  Excess throat mucous or postnasal drip 3+Moderate problem  Difficulty swallowing food, liquids, or pills 1=Very mild problem  Coughing after you eat or after lying down 3+Moderate problem  Breathing difficulties or choking episodes 1=Very mild problem  Troublesome or annoying cough 2=Moderate or slight problem  Sensation of something sticking in your throat or a lump in your throat 2=Moderate or slight problem  Heartburn, chest pain, indigestion, or stomach acid coming up 2=Moderate or slight problem   Total: 19    Normative data suggests that an RSI of greater than or equal to 13 is clinically significant  Therefore, an RSI > 13 may be indicative of significant reflux disease.  TODAY'S TREATMENT:  Skilled treatment session targeted pt's dysphagia impairment. SLP facilitated session by providing the following interventions:  Introduced EMST in hopes of improving expiratory muscles and cough strength and also submental muscles (e.g., suprhyoid group - improved hylolaryngeal elevation which some studies have some improve initiation of the pharyngeal phase of swallowing. Pt able to perform exercise independently with resistance of EMST 150 increased to 55 cmH2O to achieve a self-reported effort level of 6 out of 10 when completing 3 sets of 8.   Video of recent Modified Barium Swallow Study was reviewed with pt,  idneitification of swallow delay and moemnts of silent aspiration identified. Pt voiced understanding. Suspect chronic GERD might be contributing factor for decreased sensitivity to aspiration. Pt continues to report some nuring sensations in addition to globus sensation (associated with regurgitation). Pt reports asking to re-initate use of PPI from [employer] (GI) but then reports that medicine was discontinued by another MD (cannot recall which MD).     PATIENT EDUCATION: Education  details: see above, ST POC Person educated: Patient Education method: Explanation Education comprehension: verbalized understanding  HOME EXERCISE PROGRAM:     EMST 3 times per day; perform 3 sets of 8   GOALS: Goals reviewed with patient? Yes  SHORT TERM GOALS: Target date: 10 sessions  With Mod I, pt will demonstrate understanding of result and recommendations of MBS by verbalizing salient points.   Baseline: Goal status: INITIAL  2.  Pt will complete 3 sets of 8 EMST exercises with EMST set at resistance to promote self-reported effort level of 8 out of 10.  Baseline:  Goal status: INITIAL  3.  Pt will perform pharyngeal strengthening exercises to improve timeliness and strength of pharyngeal swallow.  Baseline:  Goal status: INITIAL   LONG TERM GOALS: Target date: 11/24/2023  To determine optimal resistance levels for Respiratory Muscle Training (RMT) for improving increase hyolaryngeal elevation and strengthen cough for airway clearance, patient will participate in evaluation (and re-assessment as needed) of maximum expiratory pressure (MEP). Baseline:  Goal status: INITIAL  2.  With Mod I, pt will utilize safe swallowing strategies in order to reduce risk of aspiration, dehydration and weight loss with consuming regular textures and thin liquids. Baseline:  Goal status: INITIAL   ASSESSMENT:  CLINICAL IMPRESSION: Patient is a 68 y.o. old who was seen today for a clinical swallow evaluation following his Modified Barium Swallow Study that revealed mild pharyngeal phase dysphagia. Pt very responsive and engaged with therapy information and exercises.   See the above note for details.    OBJECTIVE IMPAIRMENTS include dysphagia. These impairments are limiting patient from safety when swallowing.and the ability to improve participation in further esophageal assessment.   Factors affecting potential to achieve goals and functional outcome are co-morbidities. Patient  will benefit from skilled SLP services to address above impairments and improve overall function.  REHAB POTENTIAL: Good  PLAN: SLP FREQUENCY: 1-2x/week  SLP DURATION: 8 weeks  PLANNED INTERVENTIONS: Aspiration precaution training, Pharyngeal strengthening exercises, Diet toleration management , Trials of upgraded texture/liquids, SLP instruction and feedback, Compensatory strategies, and Patient/family education   Daren Yeagle B. Garlin Junker, M.S., CCC-SLP, Tree surgeon Certified Brain Injury Specialist [facility]  [facility] Office [phone] Ascom [phone] Fax [phone]

## 2023-10-01 ENCOUNTER — Ambulatory Visit: Admitting: Speech Pathology

## 2023-10-01 DIAGNOSIS — R1313 Dysphagia, pharyngeal phase: Secondary | ICD-10-CM

## 2023-10-01 NOTE — Therapy (Unsigned)
 OUTPATIENT SPEECH LANGUAGE PATHOLOGY  SWALLOW TREATMENT NOTE   Patient Name: Spencer Herring MRN: [id number] DOB:09/04/1955, 68 y.o., male 89 Date: 10/01/2023  PCP: Spencer Baumgarten, MD REFERRING PROVIDER: Landa Pine, PA   End of Session - 10/01/23 1740     Visit Number 2    Number of Visits 17    Date for SLP Re-Evaluation 11/24/23    Authorization Type [employer]    Progress Note Due on Visit 10    SLP Start Time 1445    SLP Stop Time  1530    SLP Time Calculation (min) 45 min    Activity Tolerance Patient tolerated treatment well             Past Medical History:  Diagnosis Date   Anemia    Asthma    COPD (chronic obstructive pulmonary disease) (HCC)    Morbid obesity (HCC)    Neuromuscular disorder (HCC)    Sleep apnea    Past Surgical History:  Procedure Laterality Date   BARIATRIC SURGERY N/A    COLONOSCOPY N/A 04/02/2015   Procedure: COLONOSCOPY;  Surgeon: Spencer Sager, MD;  Location: [facility] ENDOSCOPY;  Service: Endoscopy;  Laterality: N/A;   COLONOSCOPY WITH PROPOFOL  N/A 12/01/2014   Procedure: COLONOSCOPY WITH PROPOFOL ;  Surgeon: Spencer Sager, MD;  Location: [facility] ENDOSCOPY;  Service: Endoscopy;  Laterality: N/A;   COLONOSCOPY WITH PROPOFOL  N/A 10/27/2018   Procedure: COLONOSCOPY WITH PROPOFOL ;  Surgeon: Toledo, Alphonsus Jeans, MD;  Location: [facility] ENDOSCOPY;  Service: Gastroenterology;  Laterality: N/A;   CORONARY STENT INTERVENTION N/A 12/01/2017   Procedure: CORONARY STENT INTERVENTION;  Surgeon: Spencer Brace, MD;  Location: [facility] INVASIVE CV LAB;  Service: Cardiovascular;  Laterality: N/A;   CORONARY STENT INTERVENTION N/A 01/05/2023   Procedure: CORONARY STENT INTERVENTION;  Surgeon: Spencer Brace, MD;  Location: [facility] INVASIVE CV LAB;  Service: Cardiovascular;  Laterality: N/A;   ESOPHAGOGASTRODUODENOSCOPY (EGD) WITH PROPOFOL  N/A 10/27/2018   Procedure: ESOPHAGOGASTRODUODENOSCOPY (EGD) WITH PROPOFOL ;  Surgeon: Toledo,  Alphonsus Jeans, MD;  Location: [facility] ENDOSCOPY;  Service: Gastroenterology;  Laterality: N/A;   LEFT HEART CATH AND CORONARY ANGIOGRAPHY Left 12/01/2017   Procedure: LEFT HEART CATH AND CORONARY ANGIOGRAPHY;  Surgeon: Spencer Aid, MD;  Location: [facility] INVASIVE CV LAB;  Service: Cardiovascular;  Laterality: Left;   LEFT HEART CATH AND CORONARY ANGIOGRAPHY N/A 01/05/2023   Procedure: LEFT HEART CATH AND CORONARY ANGIOGRAPHY;  Surgeon: Spencer Brace, MD;  Location: [facility] INVASIVE CV LAB;  Service: Cardiovascular;  Laterality: N/A;   LOWER EXTREMITY ANGIOGRAPHY Right 12/17/2017   Procedure: LOWER EXTREMITY ANGIOGRAPHY;  Surgeon: Spencer College, MD;  Location: [facility] INVASIVE CV LAB;  Service: Cardiovascular;  Laterality: Right;   PNEUMONECTOMY Left    Patient Active Problem List   Diagnosis Date Noted   Chronic pain syndrome 08/31/2023   Pharmacologic therapy 08/31/2023   Disorder of skeletal system 08/31/2023   Problems influencing health status 08/31/2023   Persistent atrial fibrillation (HCC) 08/31/2023   Chronic anticoagulation (Eliquis  + Plavix ) 08/31/2023   Chronic foot pain (1ry area of Pain) (Bilateral) (L>R) 08/31/2023   Chronic knee pain (2ry area of Pain) (Right) 08/31/2023   Chronic shoulder pain (3ry area of Pain) (Bilateral) (R>L) 08/31/2023   Foot fracture, sequela (Left) 08/31/2023   Morbid obesity with BMI of 40.0-44.9, adult (HCC) 08/30/2023   Abdominal pain 08/30/2023   OSA (obstructive sleep apnea) 08/30/2023   HFrEF (heart failure with reduced ejection fraction) (HCC) 02/05/2023   Acute exacerbation of CHF (  congestive heart failure) (HCC) 01/01/2023   Hyperlipidemia 01/01/2023   Primary osteoarthritis involving multiple joints 09/04/2020   H/O bariatric surgery 06/17/2019   B12 deficiency 03/16/2019   Chronic ankle pain (1ry area of Pain) (Bilateral) 08/23/2018   DJD (degenerative joint disease) 08/23/2018   Localized edema 08/23/2018   Osteoarthritis of ankle 08/23/2018    Tibialis posterior tendinitis 08/23/2018   Swelling of ankle joint 08/17/2018   Iron deficiency anemia following bariatric surgery 06/08/2018   Essential hypertension 01/26/2018   Bilateral lower extremity edema 01/14/2018   Varicose veins of bilateral lower extremities with pain 01/14/2018   Morbid obesity (HCC) 12/15/2017   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) 12/15/2017   Coronary atherosclerosis of native coronary artery 12/10/2017   CAD (coronary artery disease) 12/01/2017   Angina decubitus (HCC) 11/24/2017   Stable angina pectoris (HCC) 11/03/2017   Hyperlipidemia, mixed 11/03/2017   Umbilical hernia, incarcerated 11/02/2017   Centrilobular emphysema (HCC) 08/31/2015   Plantar fasciitis 08/31/2015   Anemia 01/23/2015    ONSET DATE: aspiration initially identified on barium swallow on 05/16/225; Modified Barium Swallow on 09/25/2023 date of this referral 09/29/2023  REFERRING DIAG: R13.12 (ICD-10-CM) - Oropharyngeal dysphagia   THERAPY DIAG:  Dysphagia, pharyngeal phase  Rationale for Evaluation and Treatment Rehabilitation  SUBJECTIVE:   SUBJECTIVE STATEMENT: Pt known to this writer from Modified Barium Swallow Study on 09/25/2023 Pt accompanied by: self  PERTINENT HISTORY and DIAGNOSTIC FINDINGS:  Mr. Spencer Herring is a 68 y.o.male patient his PMH of CAD (s/p PCI and stent prox LAD 11/2017, s/p PCI and stent to OM2 01/2018, s/p PCI and stent to mid RCA 12/2022), HFrEF w/ EF 25-35% 12/2022, improved to 50% 03/2023 with GDMT, paroxysmal afib (onset 12/2022) on eliquis , HTN, HLD, OSA. Patient is here to establish care with general cardiology. Follows with advanced heart failure regularly last seen by Dr. Floria Herring 03/31/2023. Hx of hospitalization 12/2022 for SOB, fatigue, edema. Echo 01/02/23 showed severe LV dysfunction EF 20-25% with global hypokinesis. Underwent LHC 01/05/23 showing patent prior stents to LAD and OM2, PCI and stent to mid RCA lesion during this  catheterization. Had new onset a-fib during hospitalization, discharged on carvedilol , eliquis . Discharged on triple therapy with eliquis , asa, plavix  for 2-3 weeks then d/c asa.He needs to be scheduled for an endoscopy and colonoscopy but there is concern about interrupting his Eliquis  and Plavix  regimen due to the recent stent placement.    In addition,  he has noticed over the past 35-months dealing with solid food and liquid esophageal dysphagia episodes localized to level of xiphoid process. He feels like episodes have evenly between solids and liquids. He feels like things are hanging up at level of xiphoid process and takes >30 seconds to pass. There is no issue with pill dysphagia. He feels like GERD symptoms are well-controlled on once daily PPI. He has had dysphagia for years. Last EGD in 2020 showed no endoscopic abnormality to explain his complaint of dysphagia and empiric dilatation was not performed at this time. He has seed the heart failure clinic at [facility]. He is on Eliquis  and Clopidogrel  daily. He had stent placed in Sept 2024 by Dr. Parks Bollman to his RCA. He is on Eliquis  due to new diagnosis of atrial fibrillation with RVR back in September before heart cath. He is not sure how long he is going to be on Clopidogrel .   DG Esophagus 09/11/2023  FINDINGS: Swallowing: Laryngeal penetration with silent aspiration noted on initial lateral swallows which cleared when  prompted to cough. Evaluation subsequently aborted.  MBSS 09/25/2023 Pt presents with mild pharyngeal phase dysphagia that appears to be sensorimotor in nature resulting in intermittent deep penetration and 1 instance of silent aspiration when consuming thin liquids via cup and nectar thick liquids. Specifically, he has delayed swallow initiation to level of pyriform sinuses and decreased base of tongue strength. Pt's silent aspiration of thin liquid bolus occured prior to swallow initiation with bolus falling over base of  tongue into airway.    PAIN:  Are you having pain? No  FALLS: Has patient fallen in last 6 months?  No  LIVING ENVIRONMENT: Lives with: lives with their family Lives in: House/apartment  PLOF:  Level of assistance: Independent with ADLs, Independent with IADLs Employment: Full-time employment   PATIENT GOALS  to improve swallow functions  OBJECTIVE:  RECOMMENDATIONS FROM OBJECTIVE SWALLOW STUDY (MBSS/FEES):  09/25/2023 Objective swallow impairments: mild pharyngeal phase dysphagia; sensorimotor - deep penetration and silent aspiration x 1 with thin liquids and nectar thick liquids  COGNITION: Overall cognitive status: Within functional limits for tasks assessed  ORAL MOTOR EXAMINATION Facial : WFL Lingual: WFL Velum: WFL Mandible: WFL Cough: WFL Voice: WFL   CLINICAL SWALLOW ASSESSMENT:   Current diet: regular and thin liquids Dentition: adequate natural dentition Feeding: able to feed self Consistencies tested: Thin Liquid: Presentation: Cup and Self-fed Oral Phase: WFL Pharyngeal Phase: WFL Regular: Presentation: By hand Oral Phase: WFL Pharyngeal Phase: WFL   Evaluation findings: Pt with confirmed silent aspiration of thin liquids  Aspiration risk factors:History of GERD and History of esophageal-related issues Overall aspiration risk:Mild Diet Recommendations: regular and thin liquids Precautions:Minimize environmental distractions, Slow rate, Small sips/bites, Seated upright 90 degrees, and Remain upright for at least 30 minutes after meals Supervision: Patient able to feed self Oral care recommendations:Oral care BID Follow-up recommendations: Therapy as outlined in treatment plan below     PATIENT REPORTED OUTCOME MEASURES (PROM): Reflux Symptom Index These are statements many people have used to describe their voices and the effects of their voices on their lives. In the last  month, how did the following problems affect you?   Hoarseness or a  problem with your voice 2=Moderate or slight problem  Clearing your throat 3+Moderate problem  Excess throat mucous or postnasal drip 3+Moderate problem  Difficulty swallowing food, liquids, or pills 1=Very mild problem  Coughing after you eat or after lying down 3+Moderate problem  Breathing difficulties or choking episodes 1=Very mild problem  Troublesome or annoying cough 2=Moderate or slight problem  Sensation of something sticking in your throat or a lump in your throat 2=Moderate or slight problem  Heartburn, chest pain, indigestion, or stomach acid coming up 2=Moderate or slight problem   Total: 19    Normative data suggests that an RSI of greater than or equal to 13 is clinically significant  Therefore, an RSI > 13 may be indicative of significant reflux disease.  TODAY'S TREATMENT:  Skilled treatment session targeted pt's dysphagia impairment. SLP facilitated session by providing the following interventions:  Introduced EMST in hopes of improving expiratory muscles and cough strength and also submental muscles (e.g., suprhyoid group - improved hylolaryngeal elevation which some studies have some improve initiation of the pharyngeal phase of swallowing. Pt able to perform exercise independently with resistance of EMST 150 increased to 55 cmH2O to achieve a self-reported effort level of 6 out of 10 when completing 3 sets of 8.   Video of recent Modified Barium Swallow Study was reviewed with  pt, idneitification of swallow delay and moemnts of silent aspiration identified. Pt voiced understanding. Suspect chronic GERD might be contributing factor for decreased sensitivity to aspiration. Pt continues to report some nuring sensations in addition to globus sensation (associated with regurgitation). Pt reports asking to re-initate use of PPI from [employer] (GI) but then reports that medicine was discontinued by another MD (cannot recall which MD).     PATIENT EDUCATION: Education  details: see above, ST POC Person educated: Patient Education method: Explanation Education comprehension: verbalized understanding  HOME EXERCISE PROGRAM:     EMST 3 times per day; perform 3 sets of 8   GOALS: Goals reviewed with patient? Yes  SHORT TERM GOALS: Target date: 10 sessions  With Mod I, pt will demonstrate understanding of result and recommendations of MBS by verbalizing salient points.   Baseline: Goal status: INITIAL  2.  Pt will complete 3 sets of 8 EMST exercises with EMST set at resistance to promote self-reported effort level of 8 out of 10.  Baseline:  Goal status: INITIAL  3.  Pt will perform pharyngeal strengthening exercises to improve timeliness and strength of pharyngeal swallow.  Baseline:  Goal status: INITIAL   LONG TERM GOALS: Target date: 11/24/2023  To determine optimal resistance levels for Respiratory Muscle Training (RMT) for improving increase hyolaryngeal elevation and strengthen cough for airway clearance, patient will participate in evaluation (and re-assessment as needed) of maximum expiratory pressure (MEP). Baseline:  Goal status: INITIAL  2.  With Mod I, pt will utilize safe swallowing strategies in order to reduce risk of aspiration, dehydration and weight loss with consuming regular textures and thin liquids. Baseline:  Goal status: INITIAL   ASSESSMENT:  CLINICAL IMPRESSION: Patient is a 68 y.o. old who was seen today for a clinical swallow evaluation following his Modified Barium Swallow Study that revealed mild pharyngeal phase dysphagia. Pt very responsive and engaged with therapy information and exercises.   See the above note for details.    OBJECTIVE IMPAIRMENTS include dysphagia. These impairments are limiting patient from safety when swallowing.and the ability to improve participation in further esophageal assessment.   Factors affecting potential to achieve goals and functional outcome are co-morbidities. Patient  will benefit from skilled SLP services to address above impairments and improve overall function.  REHAB POTENTIAL: Good  PLAN: SLP FREQUENCY: 1-2x/week  SLP DURATION: 8 weeks  PLANNED INTERVENTIONS: Aspiration precaution training, Pharyngeal strengthening exercises, Diet toleration management , Trials of upgraded texture/liquids, SLP instruction and feedback, Compensatory strategies, and Patient/family education   Zaquan Duffner B. Garlin Junker, M.S., CCC-SLP, Tree surgeon Certified Brain Injury Specialist [facility]  [facility] Rehabilitation Services Office [phone] Ascom [phone] Fax [phone]

## 2023-10-04 NOTE — Progress Notes (Unsigned)
 PROVIDER NOTE: Interpretation of information contained herein should be left to medically-trained personnel. Specific patient instructions are provided elsewhere under "Patient Instructions" section of medical record. This document was created in part using AI and STT-dictation technology, any transcriptional errors that may result from this process are unintentional.  Patient: Spencer Herring  Service: E/M   PCP: Antonio Baumgarten, MD  DOB: 15-Oct-1955  DOS: 10/05/2023  Provider: Candi Chafe, MD  MRN: [id number]  Delivery: Face-to-face  Specialty: Interventional Pain Management  Type: Established Patient  Setting: Ambulatory outpatient facility  Specialty designation: 09  Referring Prov.: Antonio Baumgarten, MD  Location: Outpatient office facility       Primary Reason(s) for Visit: Encounter for evaluation before starting new chronic pain management plan of care (Level of risk: moderate) CC: No chief complaint on file.  HPI  Spencer Herring is a 68 y.o. year old, male patient, who comes today for a follow-up evaluation to review the test results and decide on a treatment plan. 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); Bilateral lower extremity edema; Varicose veins of bilateral lower extremities with pain; Iron deficiency anemia following bariatric surgery; B12 deficiency; Acute exacerbation of CHF (congestive heart failure) (HCC); Hyperlipidemia; Morbid obesity with BMI of 40.0-44.9, adult (HCC); Abdominal pain; Anemia; Chronic ankle pain (1ry area of Pain) (Bilateral); Centrilobular emphysema (HCC); DJD (degenerative joint disease); Essential hypertension; H/O bariatric surgery; HFrEF (heart failure with reduced ejection fraction) (HCC); Localized edema; OSA (obstructive sleep apnea); Osteoarthritis of ankle; Plantar fasciitis; Primary osteoarthritis involving multiple joints; Swelling of ankle joint;  Tibialis posterior tendinitis; Umbilical hernia, incarcerated; Stable angina pectoris (HCC); Coronary atherosclerosis of native coronary artery; Hyperlipidemia, mixed; Chronic pain syndrome; Pharmacologic therapy; Disorder of skeletal system; Problems influencing health status; Persistent atrial fibrillation (HCC); Chronic anticoagulation (Eliquis  + Plavix ); Chronic foot pain (1ry area of Pain) (Bilateral) (L>R); Chronic knee pain (2ry area of Pain) (Right); Chronic shoulder pain (3ry area of Pain) (Bilateral) (R>L); and Foot fracture, sequela (Left) on their problem list. His primarily concern today is the No chief complaint on file.  Pain Assessment: Location:     Radiating:   Onset:   Duration:   Quality:   Severity:  /10 (subjective, self-reported pain score)  Effect on ADL:   Timing:   Modifying factors:   BP:    HR:    Spencer Herring comes in today for a follow-up visit after his initial evaluation on 08/31/2023. Today we went over the results of his tests. These were explained in "Layman's terms". During today's appointment we went over my diagnostic impression, as well as the proposed treatment plan.  ***:"Mansfield R Breece is a 68 year old male with severe osteoarthritis who presents with chronic bilateral foot pain and right knee pain. He was referred by his primary doctor and podiatrist for further evaluation of his chronic foot pain.   He experiences severe pain in his left foot, originating from the ankle, attributed to arthritis. The pain has persisted for 15 years and is progressively worsening. He has used molds, inserts, and a brace for the right foot, which initially provided relief but are now ineffective due to bone shifts. Steroid injections in both feet offered temporary relief for a few weeks. X-rays from July 15, 2023, show severe osteoarthritic changes in the left foot, with marked forefoot abduction, severe pes planus, and a severely arthritic subtalar joint. He has not  undergone any surgeries on his  feet.   He has right knee pain, which he attributes to limping due to his left foot pain. He has not had any x-rays, injections, or physical therapy for the knee.   He reports right shoulder pain, which is constant and severe enough to disrupt sleep. An MRI of the left shoulder from February 16, 2023, revealed multiple tears, including a complete tear of the supraspinatus tendon and a complete tear of the proximal long head of the biceps tendon. He has not had any surgeries or injections for the shoulders.   He has a history of a left ankle fracture from a fall two years ago, which initially resolved but the pain has since returned.   He has a history of bariatric surgery, having lost nearly 200 pounds from a previous weight of almost 400 pounds. He currently weighs 212 pounds. He takes Eliquis  and Plavix  for atrial fibrillation and coronary artery disease, and he uses a CPAP machine for obstructive sleep apnea.   No hip pain. Persistent pain in the right shoulder and right knee, and chronic pain in both feet."  ***  Discussed the use of AI scribe software for clinical note transcription with the patient, who gave verbal consent to proceed.  History of Present Illness          Patient presented with interventional treatment options. Spencer Herring was informed that I will not be providing medication management. Pharmacotherapy evaluation including recommendations may be offered, if specifically requested.   Controlled Substance Pharmacotherapy Assessment REMS (Risk Evaluation and Mitigation Strategy)  Opioid Analgesic: Oxycodone -Acetaminophn 7.5-325 ;Tramadol  Hcl 50 Mg Tablet  MME/day: 42.5 mg/day   Pill Count: None expected due to no prior prescriptions written by our practice. No notes on file  Pharmacokinetics: Liberation and absorption (onset of action): WNL Distribution (time to peak effect): WNL Metabolism and excretion (duration of action): WNL          Pharmacodynamics: Desired effects: Analgesia: Spencer Herring reports >50% benefit. Functional ability: Patient reports that medication allows him to accomplish basic ADLs Clinically meaningful improvement in function (CMIF): Sustained CMIF goals met Perceived effectiveness: Described as relatively effective, allowing for increase in activities of daily living (ADL) Undesirable effects: Side-effects or Adverse reactions: None reported Monitoring: [place] PMP: PDMP reviewed during this encounter. Online review of the past 75-month period previously conducted. Not applicable at this point since we have not taken over the patient's medication management yet. List of other Serum/Urine Drug Screening Test(s):  No results found for: "AMPHSCRSER", "BARBSCRSER", "BENZOSCRSER", "COCAINSCRSER", "COCAINSCRNUR", "PCPSCRSER", "THCSCRSER", "THCU", "CANNABQUANT", "OPIATESCRSER", "OXYSCRSER", "PROPOXSCRSER", "ETH", "CBDTHCR", "D8THCCBX", "D9THCCBX" List of all UDS test(s) done:  Lab Results  Component Value Date   SUMMARY FINAL 08/31/2023   Last UDS on record: Summary  Date Value Ref Range Status  08/31/2023 FINAL  Final    Comment:    ==================================================================== Compliance Drug Analysis, Ur ==================================================================== Test                             Result       Flag       Units  Drug Present and Declared for Prescription Verification   Alpha-hydroxyalprazolam        36           EXPECTED   ng/mg creat    Alpha-hydroxyalprazolam is an expected metabolite of alprazolam .    Source of alprazolam  is a scheduled prescription medication.    Oxycodone   286          EXPECTED   ng/mg creat   Oxymorphone                    596          EXPECTED   ng/mg creat   Noroxycodone                   1170         EXPECTED   ng/mg creat   Noroxymorphone                 80           EXPECTED   ng/mg creat    Sources of  oxycodone  are scheduled prescription medications.    Oxymorphone, noroxycodone, and noroxymorphone are expected    metabolites of oxycodone . Oxymorphone is also available as a    scheduled prescription medication.    Tramadol                        >6579        EXPECTED   ng/mg creat   O-Desmethyltramadol            >6579        EXPECTED   ng/mg creat   N-Desmethyltramadol            1318         EXPECTED   ng/mg creat    Source of tramadol  is a prescription medication. O-desmethyltramadol    and N-desmethyltramadol are expected metabolites of tramadol .    Gabapentin                      PRESENT      EXPECTED   Acetaminophen                   PRESENT      EXPECTED  Drug Present not Declared for Prescription Verification   Carboxy-THC                    364          UNEXPECTED ng/mg creat    Carboxy-THC is a metabolite of tetrahydrocannabinol (THC). Source of    THC is most commonly herbal marijuana or marijuana-based products,    but THC is also present in a scheduled prescription medication.    Trace amounts of THC can be present in hemp and cannabidiol (CBD)    products. This test is not intended to distinguish between delta-9-    tetrahydrocannabinol, the predominant form of THC in most herbal or    marijuana-based products, and delta-8-tetrahydrocannabinol.    Buprenorphine                  53           UNEXPECTED ng/mg creat   Norbuprenorphine               >1316        UNEXPECTED ng/mg creat    Source of buprenorphine is a scheduled prescription medication.    Norbuprenorphine is an expected metabolite of buprenorphine.    Naproxen                       PRESENT      UNEXPECTED   Guaifenesin  PRESENT      UNEXPECTED    Guaifenesin may be administered as an over-the-counter or    prescription drug; it may also be present as a breakdown product of    methocarbamol.  Drug Absent but Declared for Prescription Verification   Tizanidine                      Not  Detected UNEXPECTED    Tizanidine , as indicated in the declared medication list, is not    always detected even when used as directed.  ==================================================================== Test                      Result    Flag   Units      Ref Range   Creatinine              76               mg/dL      >=16 ==================================================================== Declared Medications:  The flagging and interpretation on this report are based on the  following declared medications.  Unexpected results may arise from  inaccuracies in the declared medications.   **Note: The testing scope of this panel includes these medications:   Alprazolam  (Xanax )  Gabapentin  (Neurontin )  Oxycodone  (Percocet)  Tramadol  (Ultram )   **Note: The testing scope of this panel does not include small to  moderate amounts of these reported medications:   Acetaminophen  (Percocet)  Tizanidine  (Zanaflex )   **Note: The testing scope of this panel does not include the  following reported medications:   Albuterol  (AccuNeb )  Apixaban  (Eliquis )  Atorvastatin  (Lipitor )  Budesonide  (Symbicort)  Carvedilol  (Coreg )  Clopidogrel  (Plavix )  Dapagliflozin  (Farxiga )  Formoterol (Symbicort)  Furosemide  (Lasix )  Iron  Losartan  (Cozaar )  Multivitamin  Nitroglycerin  (Nitrostat )  Pantoprazole  (Protonix )  Ranolazine  (Ranexa )  Sacubitril (Entresto)  Spironolactone  (Aldactone )  Valsartan (Entresto)  Vitamin C  ==================================================================== For clinical consultation, please call [phone]. ====================================================================    UDS interpretation: No unexpected findings.          Medication Assessment Form: Not applicable. No opioids. Treatment compliance: Not applicable Risk Assessment Profile: Aberrant behavior: See initial evaluations. None observed or detected today Comorbid factors increasing risk of  overdose: See initial evaluation. No additional risks detected today Opioid risk tool (ORT):     08/31/2023    2:23 PM  Opioid Risk   Alcohol 0  Illegal Drugs 0  Rx Drugs 0  Alcohol 0  Illegal Drugs 0  Rx Drugs 0  Age between 16-45 years  0  History of Preadolescent Sexual Abuse 0  Psychological Disease 0  Depression 0  Opioid Risk Tool Scoring 0  Opioid Risk Interpretation Low Risk    ORT Scoring interpretation table:  Score <3 = Low Risk for SUD  Score between 4-7 = Moderate Risk for SUD  Score >8 = High Risk for Opioid Abuse   Risk of substance use disorder (SUD): Low  Risk Mitigation Strategies:  Patient opioid safety counseling: No controlled substances prescribed. Patient-Prescriber Agreement (PPA): No agreement signed.  Controlled substance notification to other providers: None required. No opioid therapy.  Pharmacologic Plan: Non-opioid analgesic therapy offered. Interventional alternatives discussed.             Laboratory Chemistry Profile   Renal Lab Results  Component Value Date   BUN 29 (H) 08/31/2023   CREATININE 1.01 08/31/2023   BCR 29 (H) 08/31/2023   GFRAA >60 12/02/2017   GFRNONAA >60  01/06/2023   PROTEINUR 30 mg/dL 96/07/5407     Electrolytes Lab Results  Component Value Date   NA 138 08/31/2023   K 5.4 (H) 08/31/2023   CL 99 08/31/2023   CALCIUM  10.0 08/31/2023   MG 2.4 (H) 08/31/2023   PHOS 3.5 01/05/2013     Hepatic Lab Results  Component Value Date   AST 14 08/31/2023   ALT 23 05/04/2014   ALBUMIN 4.5 08/31/2023   ALKPHOS 107 08/31/2023   LIPASE 94 10/15/2012     ID Lab Results  Component Value Date   SARSCOV2NAA NEGATIVE 01/01/2023     Bone Lab Results  Component Value Date   25OHVITD1 34 08/31/2023   25OHVITD2 <1.0 08/31/2023   25OHVITD3 34 08/31/2023     Endocrine Lab Results  Component Value Date   GLUCOSE 91 08/31/2023   GLUCOSEU Negative 07/15/2013   HGBA1C 5.6 10/15/2012   TSH 1.75 10/15/2012      Neuropathy Lab Results  Component Value Date   VITAMINB12 574 08/31/2023   FOLATE 26.0 06/08/2018   HGBA1C 5.6 10/15/2012     CNS No results found for: "COLORCSF", "APPEARCSF", "RBCCOUNTCSF", "WBCCSF", "POLYSCSF", "LYMPHSCSF", "EOSCSF", "PROTEINCSF", "GLUCCSF", "JCVIRUS", "CSFOLI", "IGGCSF", "LABACHR", "ACETBL"   Inflammation (CRP: Acute  ESR: Chronic) Lab Results  Component Value Date   CRP 3 08/31/2023   ESRSEDRATE 10 08/31/2023     Rheumatology No results found for: "RF", "ANA", "LABURIC", "URICUR", "LYMEIGGIGMAB", "LYMEABIGMQN", "HLAB27"   Coagulation Lab Results  Component Value Date   INR 1.1 01/02/2023   LABPROT 14.3 01/02/2023   APTT 95 (H) 01/05/2023   PLT 232 01/06/2023     Cardiovascular Lab Results  Component Value Date   BNP 226.2 (H) 01/01/2023   TROPONINI < 0.02 05/04/2014   HGB 13.5 01/06/2023   HCT 39.7 01/06/2023     Screening Lab Results  Component Value Date   SARSCOV2NAA NEGATIVE 01/01/2023   COVIDSOURCE NASOPHARYNGEAL 10/22/2018     Cancer No results found for: "CEA", "CA125", "LABCA2"   Allergens No results found for: "ALMOND", "APPLE", "ASPARAGUS", "AVOCADO", "BANANA", "BARLEY", "BASIL", "BAYLEAF", "GREENBEAN", "LIMABEAN", "WHITEBEAN", "BEEFIGE", "REDBEET", "BLUEBERRY", "BROCCOLI", "CABBAGE", "MELON", "CARROT", "CASEIN", "CASHEWNUT", "CAULIFLOWER", "CELERY"     Note: Lab results reviewed.  Recent Diagnostic Imaging Review  Shoulder Imaging: Shoulder-L MR wo contrast: Results for orders placed during the hospital encounter of 02/06/23 MR SHOULDER LEFT WO CONTRAST  Narrative CLINICAL DATA:  Left shoulder pain for 3 months. Limited range of motion.  EXAM: MRI OF THE LEFT SHOULDER WITHOUT CONTRAST  TECHNIQUE: Multiplanar, multisequence MR imaging of the shoulder was performed. No intravenous contrast was administered.  COMPARISON:  None Available.  FINDINGS: Rotator cuff: Complete tear of the supraspinatus tendon with 4.6  cm of retraction. Moderate tendinosis of the infraspinatus tendon with a large full-thickness tear involving the anterior 2/3 of the tendon insertion. Teres minor tendon is intact. Moderate tendinosis of the subscapularis tendon with a partial-thickness tear of the superior fibers.  Muscles: Mild fatty atrophy of the supraspinatus muscle. Moderate fatty atrophy of the infraspinatus muscle. Mild muscle edema in the infraspinatus muscle likely reflecting mild muscle strain.  Biceps Long Head: Complete tear of the proximal long head of the biceps tendon.  Acromioclavicular Joint: Severe arthropathy of the acromioclavicular joint. Small amount of subacromial/subdeltoid bursal fluid.  Glenohumeral Joint: Moderate joint effusion. Mild partial-thickness cartilage loss of the glenohumeral joint.  Labrum: Grossly intact, but evaluation is limited by lack of intraarticular fluid/contrast.  Bones: No fracture or dislocation. No  aggressive osseous lesion.  Other: No fluid collection or hematoma.  IMPRESSION: 1. Complete tear of the supraspinatus tendon with 4.6 cm of retraction. 2. Moderate tendinosis of the infraspinatus tendon with a large full-thickness tear involving the anterior 2/3 of the tendon insertion. 3. Moderate tendinosis of the subscapularis tendon with a partial-thickness tear of the superior fibers. 4. Complete tear of the proximal long head of the biceps tendon.   Electronically Signed By: Onnie Bilis M.D. On: 02/16/2023 06:08  Shoulder-R DG: Results for orders placed during the hospital encounter of 08/31/23 DG Shoulder Right  Narrative CLINICAL DATA:  Right shoulder pain for 4 months after injury.  EXAM: RIGHT SHOULDER - 2+ VIEW  COMPARISON:  No prior right shoulder exams available.  FINDINGS: There is no evidence of fracture or dislocation. Acromioclavicular degenerative spurring. Trace inferior glenohumeral spurring. No erosions or focal bone  abnormality. Soft tissues are unremarkable.  IMPRESSION: Mild acromioclavicular and glenohumeral degenerative change.   Electronically Signed By: Chadwick Colonel M.D. On: 09/01/2023 11:26  Knee Imaging: Knee-R DG 4 views: Results for orders placed during the hospital encounter of 08/31/23 DG Knee Complete 4 Views Right  Narrative CLINICAL DATA:  Chronic pain of right knee.  Primary osteoarthritis.  EXAM: RIGHT KNEE - COMPLETE 4+ VIEW  COMPARISON:  None Available.  FINDINGS: Mild medial tibiofemoral joint space narrowing. Moderate tricompartmental peripheral spurring. No fracture. No evidence of erosion or focal bone abnormality. No significant knee joint effusion. No focal soft tissue abnormalities.  IMPRESSION: Moderate tricompartmental osteoarthritis.   Electronically Signed By: Chadwick Colonel M.D. On: 09/01/2023 11:27  Knee-L DG 4 views: Results for orders placed during the hospital encounter of 07/26/21 DG Knee Complete 4 Views Left  Narrative CLINICAL DATA:  Fall, left ankle and knee pain.  EXAM: LEFT KNEE - COMPLETE 4+ VIEW  COMPARISON:  None.  FINDINGS: No evidence of fracture, dislocation, or joint effusion. Moderate osteoarthritis of the medial compartment and mild osteoarthritis of the lateral and patellofemoral compartments. No focal bone abnormality. Soft tissues are unremarkable.  IMPRESSION: No acute osseous abnormality.  Tricompartmental osteoarthritis, most pronounced in the medial compartment.   Electronically Signed By: Mercie Stalker M.D. On: 07/26/2021 13:30  Ankle Imaging: Ankle-L DG Complete: Results for orders placed during the hospital encounter of 07/26/21 DG Ankle Complete Left  Narrative CLINICAL DATA:  Left ankle and knee pain.  Status post fall.  EXAM: LEFT ANKLE COMPLETE - 3+ VIEW  COMPARISON:  None.  FINDINGS: Acute, obliquely oriented intra-articular fracture is noted involving the lateral malleolus. The  fracture fragments are in near anatomic alignment. Diffuse soft tissue swelling noted.  IMPRESSION: 1. Acute, obliquely oriented intra-articular fracture involves the lateral malleolus. 2. Soft tissue swelling.   Electronically Signed By: Kimberley Penman M.D. On: 07/26/2021 13:31  Complexity Note: Imaging results reviewed.                         Meds   Current Outpatient Medications:    albuterol  (ACCUNEB ) 0.63 MG/3ML nebulizer solution, Take 1 ampule by nebulization every 4 (four) hours as needed for wheezing or shortness of breath., Disp: , Rfl:    albuterol  (VENTOLIN  HFA) 108 (90 Base) MCG/ACT inhaler, Inhale 2 puffs into the lungs every 4 (four) hours as needed for wheezing or shortness of breath., Disp: , Rfl:    ALPRAZolam  (XANAX ) 0.25 MG tablet, Take 0.25 mg by mouth at bedtime., Disp: , Rfl:    amiodarone (PACERONE) 200  MG tablet, Take 200 mg by mouth 2 (two) times daily., Disp: , Rfl:    apixaban  (ELIQUIS ) 5 MG TABS tablet, Take 1 tablet (5 mg total) by mouth 2 (two) times daily., Disp: 60 tablet, Rfl: 1   atorvastatin  (LIPITOR ) 40 MG tablet, Take 40 mg by mouth daily., Disp: , Rfl:    atorvastatin  (LIPITOR ) 80 MG tablet, Take 0.5 tablets (40 mg total) by mouth daily. (Patient not taking: Reported on 09/18/2023), Disp: 30 tablet, Rfl: 1   budesonide -formoterol (SYMBICORT) 160-4.5 MCG/ACT inhaler, Inhale 2 puffs into the lungs daily., Disp: , Rfl:    carvedilol  (COREG ) 25 MG tablet, Take 1 tablet (25 mg total) by mouth 2 (two) times daily with a meal., Disp: 60 tablet, Rfl: 1   clopidogrel  (PLAVIX ) 75 MG tablet, Take 75 mg by mouth daily., Disp: , Rfl:    ENTRESTO 49-51 MG, Take 0.5-1 tablets by mouth 2 (two) times daily. Take 0.5 tablet by mouth in the morning and 1 tablet at night, Disp: , Rfl:    FARXIGA  10 MG TABS tablet, Take 10 mg by mouth daily., Disp: , Rfl:    ferrous sulfate  325 (65 FE) MG tablet, Take 325 mg by mouth daily with breakfast., Disp: , Rfl:    furosemide   (LASIX ) 20 MG tablet, Take 1 tablet (20 mg total) by mouth daily., Disp: 30 tablet, Rfl: 1   gabapentin  (NEURONTIN ) 300 MG capsule, Take 300-600 mg by mouth See admin instructions. Take 300 mg by mouth in the morning and afternoon and take 600 mg at night, Disp: , Rfl:    naproxen sodium (ALEVE) 220 MG tablet, Take 220 mg by mouth daily as needed (pain)., Disp: , Rfl:    nitroGLYCERIN  (NITROSTAT ) 0.4 MG SL tablet, Place 0.4 mg under the tongue every 5 (five) minutes as needed. , Disp: , Rfl:    NON FORMULARY, Pt uses a c-pap nightly, Disp: , Rfl:    oxyCODONE -acetaminophen  (PERCOCET) 7.5-325 MG per tablet, Take 1 tablet by mouth 2 (two) times daily as needed for severe pain., Disp: , Rfl:    pantoprazole  (PROTONIX ) 40 MG tablet, Take 40 mg by mouth daily., Disp: , Rfl:    ranolazine  (RANEXA ) 500 MG 12 hr tablet, Take 500 mg by mouth 2 (two) times daily., Disp: , Rfl:    spironolactone  (ALDACTONE ) 25 MG tablet, Take 0.5 tablets (12.5 mg total) by mouth daily., Disp: 15 tablet, Rfl: 1   traMADol  (ULTRAM ) 50 MG tablet, Take 50 mg by mouth in the morning and at bedtime., Disp: , Rfl:   ROS  Constitutional: Denies any fever or chills Gastrointestinal: No reported hemesis, hematochezia, vomiting, or acute GI distress Musculoskeletal: Denies any acute onset joint swelling, redness, loss of ROM, or weakness Neurological: No reported episodes of acute onset apraxia, aphasia, dysarthria, agnosia, amnesia, paralysis, loss of coordination, or loss of consciousness  Allergies  Spencer Herring is allergic to latex.  PFSH  Drug: Spencer Herring  reports no history of drug use. Alcohol:  reports current alcohol use. Tobacco:  reports that he quit smoking about 24 years ago. His smoking use included cigarettes. He has never used smokeless tobacco. Medical:  has a past medical history of Anemia, Asthma, COPD (chronic obstructive pulmonary disease) (HCC), Morbid obesity (HCC), Neuromuscular disorder (HCC), and Sleep  apnea. Surgical: Spencer Herring  has a past surgical history that includes Bariatric Surgery (N/A); Colonoscopy with propofol  (N/A, 12/01/2014); Colonoscopy (N/A, 04/02/2015); Pneumonectomy (Left); LEFT HEART CATH AND CORONARY ANGIOGRAPHY (Left, 12/01/2017); CORONARY  STENT INTERVENTION (N/A, 12/01/2017); Lower Extremity Angiography (Right, 12/17/2017); Colonoscopy with propofol  (N/A, 10/27/2018); Esophagogastroduodenoscopy (egd) with propofol  (N/A, 10/27/2018); LEFT HEART CATH AND CORONARY ANGIOGRAPHY (N/A, 01/05/2023); and CORONARY STENT INTERVENTION (N/A, 01/05/2023). Family: family history includes Heart disease in his father and paternal grandfather; Stroke in his mother.  Constitutional Exam  General appearance: Well nourished, well developed, and well hydrated. In no apparent acute distress There were no vitals filed for this visit. BMI Assessment: Estimated body mass index is 38.78 kg/m as calculated from the following:   Height as of 08/31/23: 5\' 2"  (1.575 m).   Weight as of 08/31/23: 212 lb (96.2 kg).  BMI interpretation table: BMI level Category Range association with higher incidence of chronic pain  <18 kg/m2 Underweight   18.5-24.9 kg/m2 Ideal body weight   25-29.9 kg/m2 Overweight Increased incidence by 20%  30-34.9 kg/m2 Obese (Class I) Increased incidence by 68%  35-39.9 kg/m2 Severe obesity (Class II) Increased incidence by 136%  >40 kg/m2 Extreme obesity (Class III) Increased incidence by 254%   Patient's current BMI Ideal Body weight  There is no height or weight on file to calculate BMI. Patient weight not recorded   BMI Readings from Last 4 Encounters:  08/31/23 38.78 kg/m  01/05/23 36.65 kg/m  07/26/21 39.48 kg/m  04/30/20 38.37 kg/m   Wt Readings from Last 4 Encounters:  08/31/23 212 lb (96.2 kg)  01/05/23 213 lb 8 oz (96.8 kg)  07/26/21 230 lb (104.3 kg)  04/30/20 230 lb 9.6 oz (104.6 kg)    Psych/Mental status: Alert, oriented x 3 (person, place, & time)       Eyes:  PERLA Respiratory: No evidence of acute respiratory distress  Assessment & Plan  Primary Diagnosis & Pertinent Problem List: The primary encounter diagnosis was Chronic foot pain (1ry area of Pain) (Bilateral) (L>R). Diagnoses of Chronic knee pain (2ry area of Pain) (Right), Chronic shoulder pain (3ry area of Pain) (Bilateral) (R>L), and Chronic anticoagulation (Eliquis  + Plavix ) were also pertinent to this visit. Visit Diagnosis: 1. Chronic foot pain (1ry area of Pain) (Bilateral) (L>R)   2. Chronic knee pain (2ry area of Pain) (Right)   3. Chronic shoulder pain (3ry area of Pain) (Bilateral) (R>L)   4. Chronic anticoagulation (Eliquis  + Plavix )    Problems updated and reviewed during this visit: No problems updated.  Plan of Care  Assessment and Plan             Pharmacotherapy (Medications Ordered): No orders of the defined types were placed in this encounter.  Procedure Orders    No procedure(s) ordered today   Lab Orders  No laboratory test(s) ordered today   Imaging Orders  No imaging studies ordered today   Referral Orders  No referral(s) requested today    Pharmacological management:  Opioid Analgesics: I will not be prescribing any opioids at this time Membrane stabilizer: I will not be prescribing any at this time Muscle relaxant: I will not be prescribing any at this time NSAID: I will not be prescribing any at this time Other analgesic(s): I will not be prescribing any at this time      Interventional Therapies  Risk Factors  Considerations  Medical Comorbidities:  Eliquis  + Plavix  Anticoagulation: (Stop: 7-10 days  Restart: 6 hrs)  A-Fib  CAD w/ stent  CHF w/ decreased EF  HTN  OSA on Cpap  Pseudoaneurysm of femoral artery     Planned  Pending:      Under consideration:  Pending   Completed:   None at this time   Therapeutic  Palliative (PRN) options:   None established   Completed by other providers:   None reported        Provider-requested follow-up: No follow-ups on file. Recent Visits Date Type Provider Dept  08/31/23 Office Visit Renaldo Caroli, MD [facility]  Showing recent visits within past 90 days and meeting all other requirements Future Appointments Date Type Provider Dept  10/05/23 Appointment Renaldo Caroli, MD [facility]  Showing future appointments within next 90 days and meeting all other requirements   Primary Care Physician: Antonio Baumgarten, MD  Duration of encounter: *** minutes.  Total time on encounter, as per AMA guidelines included both the face-to-face and non-face-to-face time personally spent by the physician and/or other qualified health care professional(s) on the day of the encounter (includes time in activities that require the physician or other qualified health care professional and does not include time in activities normally performed by clinical staff). Physician's time may include the following activities when performed: Preparing to see the patient (e.g., pre-charting review of records, searching for previously ordered imaging, lab work, and nerve conduction tests) Review of prior analgesic pharmacotherapies. Reviewing PMP Interpreting ordered tests (e.g., lab work, imaging, nerve conduction tests) Performing post-procedure evaluations, including interpretation of diagnostic procedures Obtaining and/or reviewing separately obtained history Performing a medically appropriate examination and/or evaluation Counseling and educating the patient/family/caregiver Ordering medications, tests, or procedures Referring and communicating with other health care professionals (when not separately reported) Documenting clinical information in the electronic or other health record Independently interpreting results (not separately reported) and communicating results to the patient/ family/caregiver Care coordination (not separately reported)  Note  by: Candi Chafe, MD (TTS technology used. I apologize for any typographical errors that were not detected and corrected.) Date: 10/05/2023; Time: 12:16 PM

## 2023-10-05 ENCOUNTER — Ambulatory Visit: Attending: Pain Medicine | Admitting: Pain Medicine

## 2023-10-05 ENCOUNTER — Encounter: Payer: Self-pay | Admitting: Pain Medicine

## 2023-10-05 VITALS — BP 113/68 | HR 52 | Temp 97.5°F | Resp 16 | Ht 62.0 in | Wt 216.0 lb

## 2023-10-05 DIAGNOSIS — M25561 Pain in right knee: Secondary | ICD-10-CM | POA: Diagnosis not present

## 2023-10-05 DIAGNOSIS — Z7901 Long term (current) use of anticoagulants: Secondary | ICD-10-CM | POA: Diagnosis not present

## 2023-10-05 DIAGNOSIS — G8929 Other chronic pain: Secondary | ICD-10-CM | POA: Insufficient documentation

## 2023-10-05 DIAGNOSIS — M25512 Pain in left shoulder: Secondary | ICD-10-CM | POA: Insufficient documentation

## 2023-10-05 DIAGNOSIS — M25511 Pain in right shoulder: Secondary | ICD-10-CM | POA: Insufficient documentation

## 2023-10-05 DIAGNOSIS — M79671 Pain in right foot: Secondary | ICD-10-CM | POA: Insufficient documentation

## 2023-10-05 DIAGNOSIS — M79672 Pain in left foot: Secondary | ICD-10-CM | POA: Insufficient documentation

## 2023-10-05 DIAGNOSIS — M1711 Unilateral primary osteoarthritis, right knee: Secondary | ICD-10-CM | POA: Insufficient documentation

## 2023-10-05 NOTE — Patient Instructions (Signed)
 ______________________________________________________________________    TENS (Device can be purchased "online", without prescription. Search: "TENS 7000".) Transcutaneous electrical nerve stimulation (TENS) is a method of pain relief involving the use of a mild electrical current. A TENS machine is a small, battery-operated device that has leads connected to sticky pads called electrodes.   Rechargeable 9V batteries:     Electrode placement:   TENS UNIT SAFETY WARNING SHEET and INFORMATION INDICATIONS AND CONTRAINDICTIONS Read the operation manual before using the device. Freight forwarder ([place] ) restricts this device to sale by or on the order of a physician. Observe your physician's precise instructions and let him show you where to apply the electrodes. For a successful therapy, the correct application of the electrodes is an important factor. Carefully write down the settings your physician recommended. Indications for use This device is a prescription device and only for symptomatic relief of chronic intractable pain. Contraindications:   Any electrode placement that applies current to the carotid sinus (neck) region.   Patients with implanted electronic devices (for example, a pacemaker) or metallic implants should not undertake.   Any electrode placement that causes current to flow transcerebrally (through the head). The use of unit whenever pain symptoms are undiagnosed, unit etiology is determined.   The use of TENS whenever pain syndromes are undiagnosed, until etiology is established.  WARNINGS AND PRECAUTIONS  Warnings:   The device must be kept out of reach of children.   The safety of device for use during pregnancy or delivery has not been established.   Do not place electrodes on front of the throat. This may result in spasms of the laryngeal and pharyngeal muscles.   Do not place the electrodes over the carotid nerve (side of neck below ear).   The device is not effective for pain of  central origin (headaches).   The device may interfere with electronic monitoring equipment (such as ECG monitors and ECG alarms).   Electrodes should not be placed over the eyes, in the mouth, or internally.   These devices have no curative value.   TENS devices should be used only under the continued supervision of a physician.   TENS is a symptomatic treatment and as such suppresses the sensation of pain which would otherwise serve as a protective mechanism. Precautions/Adverse Reactions   Isolated cases of skin irritation may occur at the site of electrode placement following long-term application.   Stimulation should be stopped and electrodes removed until the cause of the irritation can be determined.   Effectiveness is highly dependent upon patient selection by a person qualified in the management of pain patients.   If the device treatment becomes ineffective or unpleasant, stimulation should be discontinued until reevaluation by a physician/clinician.   Always turn the device off before applying or removing electrodes.   Skin irritation and electrode burns are potential adverse reactions.  PURPOSE: A Transcutaneous Electrical Nerve Stimulator, or TENS, unit is designed to relieve post-operative, acute and chronic pain. It is used for pain caused by peripheral nerves and not central. TENS units are prescription-only devices.  OPERATION: TENS units work in a couple of ways. The first way they are thought to work is by a method called the [employer]. The [employer] states that our brains can only handle one stimulus at a time. When you have chronic pain, this pain signal is constantly being sent to your brain and recognized as pain. When an electrical stimulus is added to the area of pain the body feels  this electrical stimulus, and since the brain can only handle one thing at a time, the pain is not transmitted to the brain. The second method thought to be part of TENS unit's success is by way of  stimulating our own bodies to release their own natural painkillers. TENS units do not work for everyone and results may vary. Always follow the instructions and warnings in your user's manual.  USE: One of the most important tasks that must be performed is battery maintenance. If you are using a Engineering geologist, always fully charge it and fully deplete it before charging it again. These batteries can develop memories and by not performing this charging task correctly, your battery's life can be greatly diminished. If your battery does develop a memory you can help expand the memory by charging for 12 - 13 hours and then completely depleting the battery. Always prepare the skin before applying electrodes. Your skin should be clean and free of any lotions or creams. If you are using electrodes that use conductive gel, apply a small, even layer over the electrode. For carbon, self-adhesive electrodes, apply a drop of water to the electrodes before applying to the skin. The electrodes attach to the lead wires and then the TENS unit. Always grasp the connector and not the cord when inserting or removing. When making adjustments, always make sure the unit's channels (1 and 2) are in the OFF position. The actual settings should be recommended and prescribed by your physician. Medical equipment suppliers don'tset or instruct users as to user settings. When you are using the BURST mode, the unit delivers a series of quick pulses followed by a rest. This cycle repeats itself frequently. Always have channels OFF before changing modes.  For MODULATION mode, the stimulation automatically varies the width of the pulse.  For CONVENTIONAL mode, the stimulation is constant. After the settings have been fine-tuned, set the timer to 30 or 60 minutes. Your physician should also prescribe the use time. When the lights become dim, it means your batteries should be replaced or recharged.  ACCESSORIES: The  electrodes and lead wires can be obtained from your medical equipment supplier. Your medical equipment supplier can set up a recurring delivery to accommodate your needs. Electrodes should be replaced once a month and lead wires once every 6 months.  Video Tutorial https://youtu.be/[email or web address]?[email or web address]  ______________________________________________________________________

## 2023-10-05 NOTE — Progress Notes (Signed)
 Safety precautions to be maintained throughout the outpatient stay will include: orient to surroundings, keep bed in low position, maintain call bell within reach at all times, provide assistance with transfer out of bed and ambulation.

## 2023-10-06 ENCOUNTER — Ambulatory Visit: Admitting: Speech Pathology

## 2023-10-08 ENCOUNTER — Ambulatory Visit: Admitting: Speech Pathology

## 2023-10-08 DIAGNOSIS — R1312 Dysphagia, oropharyngeal phase: Secondary | ICD-10-CM

## 2023-10-08 DIAGNOSIS — R1313 Dysphagia, pharyngeal phase: Secondary | ICD-10-CM | POA: Diagnosis not present

## 2023-10-08 NOTE — Therapy (Signed)
 OUTPATIENT SPEECH LANGUAGE PATHOLOGY  SWALLOW TREATMENT NOTE   Patient Name: Spencer Herring MRN: [id number] DOB:03-09-1956, 68 y.o., male 30 Date: 10/08/2023  PCP: Antonio Baumgarten, MD REFERRING PROVIDER: Landa Pine, PA   End of Session - 10/08/23 1324     Visit Number 3    Number of Visits 17    Date for SLP Re-Evaluation 11/24/23    Authorization Type [employer]    Progress Note Due on Visit 10    SLP Start Time 1315    SLP Stop Time  1345    SLP Time Calculation (min) 30 min    Activity Tolerance Patient tolerated treatment well          Past Medical History:  Diagnosis Date   Anemia    Asthma    COPD (chronic obstructive pulmonary disease) (HCC)    Morbid obesity (HCC)    Neuromuscular disorder (HCC)    Sleep apnea    Past Surgical History:  Procedure Laterality Date   BARIATRIC SURGERY N/A    COLONOSCOPY N/A 04/02/2015   Procedure: COLONOSCOPY;  Surgeon: Luella Sager, MD;  Location: [facility] ENDOSCOPY;  Service: Endoscopy;  Laterality: N/A;   COLONOSCOPY WITH PROPOFOL  N/A 12/01/2014   Procedure: COLONOSCOPY WITH PROPOFOL ;  Surgeon: Luella Sager, MD;  Location: [facility] ENDOSCOPY;  Service: Endoscopy;  Laterality: N/A;   COLONOSCOPY WITH PROPOFOL  N/A 10/27/2018   Procedure: COLONOSCOPY WITH PROPOFOL ;  Surgeon: Toledo, Alphonsus Jeans, MD;  Location: [facility] ENDOSCOPY;  Service: Gastroenterology;  Laterality: N/A;   CORONARY STENT INTERVENTION N/A 12/01/2017   Procedure: CORONARY STENT INTERVENTION;  Surgeon: Percival Brace, MD;  Location: [facility] INVASIVE CV LAB;  Service: Cardiovascular;  Laterality: N/A;   CORONARY STENT INTERVENTION N/A 01/05/2023   Procedure: CORONARY STENT INTERVENTION;  Surgeon: Percival Brace, MD;  Location: [facility] INVASIVE CV LAB;  Service: Cardiovascular;  Laterality: N/A;   ESOPHAGOGASTRODUODENOSCOPY (EGD) WITH PROPOFOL  N/A 10/27/2018   Procedure: ESOPHAGOGASTRODUODENOSCOPY (EGD) WITH PROPOFOL ;  Surgeon: Toledo,  Alphonsus Jeans, MD;  Location: [facility] ENDOSCOPY;  Service: Gastroenterology;  Laterality: N/A;   LEFT HEART CATH AND CORONARY ANGIOGRAPHY Left 12/01/2017   Procedure: LEFT HEART CATH AND CORONARY ANGIOGRAPHY;  Surgeon: Michelle Aid, MD;  Location: [facility] INVASIVE CV LAB;  Service: Cardiovascular;  Laterality: Left;   LEFT HEART CATH AND CORONARY ANGIOGRAPHY N/A 01/05/2023   Procedure: LEFT HEART CATH AND CORONARY ANGIOGRAPHY;  Surgeon: Percival Brace, MD;  Location: [facility] INVASIVE CV LAB;  Service: Cardiovascular;  Laterality: N/A;   LOWER EXTREMITY ANGIOGRAPHY Right 12/17/2017   Procedure: LOWER EXTREMITY ANGIOGRAPHY;  Surgeon: Celso College, MD;  Location: [facility] INVASIVE CV LAB;  Service: Cardiovascular;  Laterality: Right;   PNEUMONECTOMY Left    Patient Active Problem List   Diagnosis Date Noted   Primary osteoarthritis of right knee 10/05/2023   Tricompartment osteoarthritis of right knee 10/05/2023   Chronic pain syndrome 08/31/2023   Pharmacologic therapy 08/31/2023   Disorder of skeletal system 08/31/2023   Problems influencing health status 08/31/2023   Persistent atrial fibrillation (HCC) 08/31/2023   Chronic anticoagulation (Eliquis  + Plavix ) 08/31/2023   Chronic foot pain (1ry area of Pain) (Bilateral) (L>R) 08/31/2023   Chronic knee pain (2ry area of Pain) (Right) 08/31/2023   Chronic shoulder pain (3ry area of Pain) (Bilateral) (R>L) 08/31/2023   Foot fracture, sequela (Left) 08/31/2023   Morbid obesity with BMI of 40.0-44.9, adult (HCC) 08/30/2023   Abdominal pain 08/30/2023   OSA (obstructive sleep apnea) 08/30/2023   HFrEF (heart  failure with reduced ejection fraction) (HCC) 02/05/2023   Acute exacerbation of CHF (congestive heart failure) (HCC) 01/01/2023   Hyperlipidemia 01/01/2023   Primary osteoarthritis involving multiple joints 09/04/2020   H/O bariatric surgery 06/17/2019   B12 deficiency 03/16/2019   Chronic ankle pain (1ry area of Pain) (Bilateral) 08/23/2018    DJD (degenerative joint disease) 08/23/2018   Localized edema 08/23/2018   Osteoarthritis of ankle 08/23/2018   Tibialis posterior tendinitis 08/23/2018   Swelling of ankle joint 08/17/2018   Iron deficiency anemia following bariatric surgery 06/08/2018   Essential hypertension 01/26/2018   Bilateral lower extremity edema 01/14/2018   Varicose veins of bilateral lower extremities with pain 01/14/2018   Morbid obesity (HCC) 12/15/2017   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) 12/15/2017   Coronary atherosclerosis of native coronary artery 12/10/2017   CAD (coronary artery disease) 12/01/2017   Angina decubitus (HCC) 11/24/2017   Stable angina pectoris (HCC) 11/03/2017   Hyperlipidemia, mixed 11/03/2017   Umbilical hernia, incarcerated 11/02/2017   Centrilobular emphysema (HCC) 08/31/2015   Plantar fasciitis 08/31/2015   Anemia 01/23/2015    ONSET DATE: aspiration initially identified on barium swallow on 05/16/225; Modified Barium Swallow on 09/25/2023 date of this referral 09/29/2023  REFERRING DIAG: R13.12 (ICD-10-CM) - Oropharyngeal dysphagia   THERAPY DIAG:  Dysphagia, oropharyngeal phase  Rationale for Evaluation and Treatment Rehabilitation  SUBJECTIVE:   PERTINENT HISTORY and DIAGNOSTIC FINDINGS:  Spencer Herring is a 68 y.o.male patient his PMH of CAD (s/p PCI and stent prox LAD 11/2017, s/p PCI and stent to OM2 01/2018, s/p PCI and stent to mid RCA 12/2022), HFrEF w/ EF 25-35% 12/2022, improved to 50% 03/2023 with GDMT, paroxysmal afib (onset 12/2022) on eliquis , HTN, HLD, OSA. Patient is here to establish care with general cardiology. Follows with advanced heart failure regularly last seen by Dr. Floria Hurst 03/31/2023. Hx of hospitalization 12/2022 for SOB, fatigue, edema. Echo 01/02/23 showed severe LV dysfunction EF 20-25% with global hypokinesis. Underwent LHC 01/05/23 showing patent prior stents to LAD and OM2, PCI and stent to mid RCA lesion during this  catheterization. Had new onset a-fib during hospitalization, discharged on carvedilol , eliquis . Discharged on triple therapy with eliquis , asa, plavix  for 2-3 weeks then d/c asa.He needs to be scheduled for an endoscopy and colonoscopy but there is concern about interrupting his Eliquis  and Plavix  regimen due to the recent stent placement.    In addition,  he has noticed over the past 54-months dealing with solid food and liquid esophageal dysphagia episodes localized to level of xiphoid process. He feels like episodes have evenly between solids and liquids. He feels like things are hanging up at level of xiphoid process and takes >30 seconds to pass. There is no issue with pill dysphagia. He feels like GERD symptoms are well-controlled on once daily PPI. He has had dysphagia for years. Last EGD in 2020 showed no endoscopic abnormality to explain his complaint of dysphagia and empiric dilatation was not performed at this time. He has seed the heart failure clinic at [facility]. He is on Eliquis  and Clopidogrel  daily. He had stent placed in Sept 2024 by Dr. Parks Bollman to his RCA. He is on Eliquis  due to new diagnosis of atrial fibrillation with RVR back in September before heart cath. He is not sure how long he is going to be on Clopidogrel .   DG Esophagus 09/11/2023  FINDINGS: Swallowing: Laryngeal penetration with silent aspiration noted on initial lateral swallows which cleared when prompted to cough. Evaluation subsequently aborted.  MBSS 09/25/2023 Pt presents with mild pharyngeal phase dysphagia that appears to be sensorimotor in nature resulting in intermittent deep penetration and 1 instance of silent aspiration when consuming thin liquids via cup and nectar thick liquids. Specifically, he has delayed swallow initiation to level of pyriform sinuses and decreased base of tongue strength. Pt's silent aspiration of thin liquid bolus occured prior to swallow initiation with bolus falling over base of  tongue into airway.    PAIN:  Are you having pain? No  FALLS: Has patient fallen in last 6 months?  No  LIVING ENVIRONMENT: Lives with: lives with their family Lives in: House/apartment  PLOF:  Level of assistance: Independent with ADLs, Independent with IADLs Employment: Full-time employment   PATIENT GOALS  to improve swallow functions  SUBJECTIVE STATEMENT: Pt missed session d/t work.  Pt accompanied by: self  OBJECTIVE:   TODAY'S TREATMENT:  Skilled treatment session targeted pt's dysphagia impairment. SLP facilitated session by providing the following interventions:  Pt states that he left EMST in his truck. He reports increased ability with instructions provided on increasing resistance mildly.   SLP introduced pharyngeal strengthening exercise - Masako. Pt able to produce 3 repetitions which is improvement over previous session. SLP introduced [employer] Against Resistance - pt able to produce but I have to think about it.   PATIENT EDUCATION: Education details: see above, ST POC Person educated: Patient Education method: Explanation Education comprehension: verbalized understanding  HOME EXERCISE PROGRAM:     EMST 3 times per day; perform 3 sets of 8  Pharyngeal strengthening - Masako, CTAR   GOALS: Goals reviewed with patient? Yes  SHORT TERM GOALS: Target date: 10 sessions  With Mod I, pt will demonstrate understanding of result and recommendations of MBS by verbalizing salient points.   Baseline: Goal status: INITIAL  2.  Pt will complete 3 sets of 8 EMST exercises with EMST set at resistance to promote self-reported effort level of 8 out of 10.  Baseline:  Goal status: INITIAL  3.  Pt will perform pharyngeal strengthening exercises to improve timeliness and strength of pharyngeal swallow.  Baseline:  Goal status: INITIAL   LONG TERM GOALS: Target date: 11/24/2023  To determine optimal resistance levels for Respiratory Muscle Training (RMT)  for improving increase hyolaryngeal elevation and strengthen cough for airway clearance, patient will participate in evaluation (and re-assessment as needed) of maximum expiratory pressure (MEP). Baseline:  Goal status: INITIAL  2.  With Mod I, pt will utilize safe swallowing strategies in order to reduce risk of aspiration, dehydration and weight loss with consuming regular textures and thin liquids. Baseline:  Goal status: INITIAL   ASSESSMENT:  CLINICAL IMPRESSION: Patient is a 68 y.o. old who was seen today for a dysphagia treatment following his Modified Barium Swallow Study that revealed mild pharyngeal phase dysphagia. Pt very responsive and engaged with therapy information and exercises.   See the above note for details.    OBJECTIVE IMPAIRMENTS include dysphagia. These impairments are limiting patient from safety when swallowing.and the ability to improve participation in further esophageal assessment.   Factors affecting potential to achieve goals and functional outcome are co-morbidities. Patient will benefit from skilled SLP services to address above impairments and improve overall function.  REHAB POTENTIAL: Good  PLAN: SLP FREQUENCY: 1-2x/week  SLP DURATION: 8 weeks  PLANNED INTERVENTIONS: Aspiration precaution training, Pharyngeal strengthening exercises, Diet toleration management , Trials of upgraded texture/liquids, SLP instruction and feedback, Compensatory strategies, and Patient/family education   Delora Gravatt B.  Garlin Junker, M.S., CCC-SLP, Tree surgeon Certified Brain Injury Specialist [facility]  [facility] Office [phone] Ascom [phone] Fax [phone]

## 2023-10-13 ENCOUNTER — Ambulatory Visit: Admitting: Speech Pathology

## 2023-10-20 ENCOUNTER — Ambulatory Visit: Admitting: Speech Pathology

## 2023-10-22 ENCOUNTER — Ambulatory Visit: Admitting: Speech Pathology

## 2023-10-22 DIAGNOSIS — R1313 Dysphagia, pharyngeal phase: Secondary | ICD-10-CM | POA: Diagnosis not present

## 2023-10-22 NOTE — Therapy (Signed)
 OUTPATIENT SPEECH LANGUAGE PATHOLOGY  SWALLOW TREATMENT NOTE   Patient Name: Spencer Herring MRN: [id number] DOB:[birth year], 68 y.o., male 55 Date: 10/22/2023  PCP: Tamra Leventhal, MD REFERRING PROVIDER: Twyla CHRISTELLA Primmer, PA   End of Session - 10/22/23 1437     Visit Number 4    Number of Visits 17    Date for SLP Re-Evaluation 11/24/23    Authorization Type [employer]    Progress Note Due on Visit 10    SLP Start Time 1435    SLP Stop Time  1520    SLP Time Calculation (min) 45 min    Activity Tolerance Patient tolerated treatment well          Past Medical History:  Diagnosis Date   Anemia    Asthma    COPD (chronic obstructive pulmonary disease) (HCC)    Morbid obesity (HCC)    Neuromuscular disorder (HCC)    Sleep apnea    Past Surgical History:  Procedure Laterality Date   BARIATRIC SURGERY N/A    COLONOSCOPY N/A 04/02/2015   Procedure: COLONOSCOPY;  Surgeon: Donnice Vaughn Manes, MD;  Location: [facility] ENDOSCOPY;  Service: Endoscopy;  Laterality: N/A;   COLONOSCOPY WITH PROPOFOL  N/A 12/01/2014   Procedure: COLONOSCOPY WITH PROPOFOL ;  Surgeon: Donnice Vaughn Manes, MD;  Location: [facility] ENDOSCOPY;  Service: Endoscopy;  Laterality: N/A;   COLONOSCOPY WITH PROPOFOL  N/A 10/27/2018   Procedure: COLONOSCOPY WITH PROPOFOL ;  Surgeon: Toledo, Ladell POUR, MD;  Location: [facility] ENDOSCOPY;  Service: Gastroenterology;  Laterality: N/A;   CORONARY STENT INTERVENTION N/A 12/01/2017   Procedure: CORONARY STENT INTERVENTION;  Surgeon: Ammon Blunt, MD;  Location: [facility] INVASIVE CV LAB;  Service: Cardiovascular;  Laterality: N/A;   CORONARY STENT INTERVENTION N/A 01/05/2023   Procedure: CORONARY STENT INTERVENTION;  Surgeon: Ammon Blunt, MD;  Location: [facility] INVASIVE CV LAB;  Service: Cardiovascular;  Laterality: N/A;   ESOPHAGOGASTRODUODENOSCOPY (EGD) WITH PROPOFOL  N/A 10/27/2018   Procedure: ESOPHAGOGASTRODUODENOSCOPY (EGD) WITH PROPOFOL ;  Surgeon: Toledo,  Ladell POUR, MD;  Location: [facility] ENDOSCOPY;  Service: Gastroenterology;  Laterality: N/A;   LEFT HEART CATH AND CORONARY ANGIOGRAPHY Left 12/01/2017   Procedure: LEFT HEART CATH AND CORONARY ANGIOGRAPHY;  Surgeon: Hester Wolm PARAS, MD;  Location: [facility] INVASIVE CV LAB;  Service: Cardiovascular;  Laterality: Left;   LEFT HEART CATH AND CORONARY ANGIOGRAPHY N/A 01/05/2023   Procedure: LEFT HEART CATH AND CORONARY ANGIOGRAPHY;  Surgeon: Ammon Blunt, MD;  Location: [facility] INVASIVE CV LAB;  Service: Cardiovascular;  Laterality: N/A;   LOWER EXTREMITY ANGIOGRAPHY Right 12/17/2017   Procedure: LOWER EXTREMITY ANGIOGRAPHY;  Surgeon: Marea Selinda RAMAN, MD;  Location: [facility] INVASIVE CV LAB;  Service: Cardiovascular;  Laterality: Right;   PNEUMONECTOMY Left    Patient Active Problem List   Diagnosis Date Noted   Primary osteoarthritis of right knee 10/05/2023   Tricompartment osteoarthritis of right knee 10/05/2023   Chronic pain syndrome 08/31/2023   Pharmacologic therapy 08/31/2023   Disorder of skeletal system 08/31/2023   Problems influencing health status 08/31/2023   Persistent atrial fibrillation (HCC) 08/31/2023   Chronic anticoagulation (Eliquis  + Plavix ) 08/31/2023   Chronic foot pain (1ry area of Pain) (Bilateral) (L>R) 08/31/2023   Chronic knee pain (2ry area of Pain) (Right) 08/31/2023   Chronic shoulder pain (3ry area of Pain) (Bilateral) (R>L) 08/31/2023   Foot fracture, sequela (Left) 08/31/2023   Morbid obesity with BMI of 40.0-44.9, adult (HCC) 08/30/2023   Abdominal pain 08/30/2023   OSA (obstructive sleep apnea) 08/30/2023   HFrEF (heart  failure with reduced ejection fraction) (HCC) 02/05/2023   Acute exacerbation of CHF (congestive heart failure) (HCC) 01/01/2023   Hyperlipidemia 01/01/2023   Primary osteoarthritis involving multiple joints 09/04/2020   H/O bariatric surgery 06/17/2019   B12 deficiency 03/16/2019   Chronic ankle pain (1ry area of Pain) (Bilateral) 08/23/2018    DJD (degenerative joint disease) 08/23/2018   Localized edema 08/23/2018   Osteoarthritis of ankle 08/23/2018   Tibialis posterior tendinitis 08/23/2018   Swelling of ankle joint 08/17/2018   Iron deficiency anemia following bariatric surgery 06/08/2018   Essential hypertension 01/26/2018   Bilateral lower extremity edema 01/14/2018   Varicose veins of bilateral lower extremities with pain 01/14/2018   Morbid obesity (HCC) 12/15/2017   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) 12/15/2017   Coronary atherosclerosis of native coronary artery 12/10/2017   CAD (coronary artery disease) 12/01/2017   Angina decubitus (HCC) 11/24/2017   Stable angina pectoris (HCC) 11/03/2017   Hyperlipidemia, mixed 11/03/2017   Umbilical hernia, incarcerated 11/02/2017   Centrilobular emphysema (HCC) 08/31/2015   Plantar fasciitis 08/31/2015   Anemia 01/23/2015    ONSET DATE: aspiration initially identified on barium swallow on 05/16/225; Modified Barium Swallow on 09/25/2023 date of this referral 09/29/2023  REFERRING DIAG: R13.12 (ICD-10-CM) - Oropharyngeal dysphagia   THERAPY DIAG:  Dysphagia, pharyngeal phase  Rationale for Evaluation and Treatment Rehabilitation  SUBJECTIVE:   PERTINENT HISTORY and DIAGNOSTIC FINDINGS:  Spencer Herring is a 68 y.o.male patient his PMH of CAD (s/p PCI and stent prox LAD 11/2017, s/p PCI and stent to OM2 01/2018, s/p PCI and stent to mid RCA 12/2022), HFrEF w/ EF 25-35% 12/2022, improved to 50% 03/2023 with GDMT, paroxysmal afib (onset 12/2022) on eliquis , HTN, HLD, OSA. Patient is here to establish care with general cardiology. Follows with advanced heart failure regularly last seen by Dr. Nanci Blanch 03/31/2023. Hx of hospitalization 12/2022 for SOB, fatigue, edema. Echo 01/02/23 showed severe LV dysfunction EF 20-25% with global hypokinesis. Underwent LHC 01/05/23 showing patent prior stents to LAD and OM2, PCI and stent to mid RCA lesion during this  catheterization. Had new onset a-fib during hospitalization, discharged on carvedilol , eliquis . Discharged on triple therapy with eliquis , asa, plavix  for 2-3 weeks then d/c asa.He needs to be scheduled for an endoscopy and colonoscopy but there is concern about interrupting his Eliquis  and Plavix  regimen due to the recent stent placement.    In addition,  he has noticed over the past 11-months dealing with solid food and liquid esophageal dysphagia episodes localized to level of xiphoid process. He feels like episodes have evenly between solids and liquids. He feels like things are hanging up at level of xiphoid process and takes >30 seconds to pass. There is no issue with pill dysphagia. He feels like GERD symptoms are well-controlled on once daily PPI. He has had dysphagia for years. Last EGD in 2020 showed no endoscopic abnormality to explain his complaint of dysphagia and empiric dilatation was not performed at this time. He has seed the heart failure clinic at [facility]. He is on Eliquis  and Clopidogrel  daily. He had stent placed in Sept 2024 by Dr. Ammon to his RCA. He is on Eliquis  due to new diagnosis of atrial fibrillation with RVR back in September before heart cath. He is not sure how long he is going to be on Clopidogrel .   DG Esophagus 09/11/2023  FINDINGS: Swallowing: Laryngeal penetration with silent aspiration noted on initial lateral swallows which cleared when prompted to cough. Evaluation subsequently aborted.  MBSS 09/25/2023 Pt presents with mild pharyngeal phase dysphagia that appears to be sensorimotor in nature resulting in intermittent deep penetration and 1 instance of silent aspiration when consuming thin liquids via cup and nectar thick liquids. Specifically, he has delayed swallow initiation to level of pyriform sinuses and decreased base of tongue strength. Pt's silent aspiration of thin liquid bolus occured prior to swallow initiation with bolus falling over base of  tongue into airway.    PAIN:  Are you having pain? No  FALLS: Has patient fallen in last 6 months?  No  LIVING ENVIRONMENT: Lives with: lives with their family Lives in: House/apartment  PLOF:  Level of assistance: Independent with ADLs, Independent with IADLs Employment: Full-time employment   PATIENT GOALS  to improve swallow functions  SUBJECTIVE STATEMENT: Pt missed a session d/t the heat Pt accompanied by: self  OBJECTIVE:   TODAY'S TREATMENT:  Skilled treatment session targeted pt's dysphagia impairment. SLP facilitated session by providing the following interventions:  Pt agreeable to attempting EMST I don't know how I will do because I can hardly breathe (related to heat) - pt able to perform 3 sets of 8 repetitions with device advanced to 75cm H2O to achieve a self-reported effort level of 7 out of 10  Pt asked when I lean my head back, it seems to go down easier than when I am just sipping why is that? Education provided on silent aspiration with pt reporting increased coughing (all the time) when consuming thin liquids - this might be sign of increased sensation to aspiration - will need to repeat MBSS to confirm - pt's main complaint I am still choking down here (pointing nipple line epigastric area) that seems to be getting worse.  Pt is using small bites, slow rate, alternating liquids and solids with no improvement in globus sensation   Pt able to produce 2 sets of 8 repetitions of Chin Tuck Against Resistance - he struggled with Masako but reports improvement in ability at home  Skilled observation was provided of pt consuming thin liquids via cup. Pt with increased cough response in 3 out of 8 swallows.   PATIENT EDUCATION: Education details: see above Person educated: Patient Education method: Explanation Education comprehension: verbalized understanding  HOME EXERCISE PROGRAM:     EMST 3 times per day; perform 3 sets of 8  Pharyngeal  strengthening - Masako, CTAR   GOALS: Goals reviewed with patient? Yes  SHORT TERM GOALS: Target date: 10 sessions  With Mod I, pt will demonstrate understanding of result and recommendations of MBS by verbalizing salient points.   Baseline: Goal status: INITIAL  2.  Pt will complete 3 sets of 8 EMST exercises with EMST set at resistance to promote self-reported effort level of 8 out of 10.  Baseline:  Goal status: INITIAL  3.  Pt will perform pharyngeal strengthening exercises to improve timeliness and strength of pharyngeal swallow.  Baseline:  Goal status: INITIAL   LONG TERM GOALS: Target date: 11/24/2023  To determine optimal resistance levels for Respiratory Muscle Training (RMT) for improving increase hyolaryngeal elevation and strengthen cough for airway clearance, patient will participate in evaluation (and re-assessment as needed) of maximum expiratory pressure (MEP). Baseline:  Goal status: INITIAL  2.  With Mod I, pt will utilize safe swallowing strategies in order to reduce risk of aspiration, dehydration and weight loss with consuming regular textures and thin liquids. Baseline:  Goal status: INITIAL   ASSESSMENT:  CLINICAL IMPRESSION: Patient is a 68 y.o.  old who was seen today for a dysphagia treatment following his Modified Barium Swallow Study that revealed mild pharyngeal phase dysphagia. Pt very responsive and engaged with therapy information and exercises.   Will continue ST POT with repeat MBSS planned for early August. See the above note for details.    OBJECTIVE IMPAIRMENTS include dysphagia. These impairments are limiting patient from safety when swallowing.and the ability to improve participation in further esophageal assessment.   Factors affecting potential to achieve goals and functional outcome are co-morbidities. Patient will benefit from skilled SLP services to address above impairments and improve overall function.  REHAB POTENTIAL:  Good  PLAN: SLP FREQUENCY: 1-2x/week  SLP DURATION: 8 weeks  PLANNED INTERVENTIONS: Aspiration precaution training, Pharyngeal strengthening exercises, Diet toleration management , Trials of upgraded texture/liquids, SLP instruction and feedback, Compensatory strategies, and Patient/family education   Hairo Garraway B. Rubbie, M.S., CCC-SLP, Tree surgeon Certified Brain Injury Specialist [facility]  [facility] Rehabilitation Services Office [phone] Ascom [phone] Fax [phone]

## 2023-10-27 ENCOUNTER — Encounter: Admitting: Speech Pathology

## 2023-10-29 ENCOUNTER — Ambulatory Visit: Admitting: Speech Pathology

## 2023-11-04 ENCOUNTER — Ambulatory Visit: Attending: Gastroenterology | Admitting: Speech Pathology

## 2023-11-04 DIAGNOSIS — R1313 Dysphagia, pharyngeal phase: Secondary | ICD-10-CM | POA: Insufficient documentation

## 2023-11-04 NOTE — Therapy (Signed)
 OUTPATIENT SPEECH LANGUAGE PATHOLOGY  SWALLOW TREATMENT NOTE   Patient Name: Spencer Herring MRN: [id number] DOB:02/11/1956, 68 y.o., male 52 Date: 11/04/2023  PCP: Tamra Leventhal, MD REFERRING PROVIDER: Twyla CHRISTELLA Primmer, PA   End of Session - 11/04/23 1526     Visit Number 5    Number of Visits 17    Date for SLP Re-Evaluation 11/24/23    Authorization Type [employer]    Progress Note Due on Visit 10    SLP Start Time 1530    SLP Stop Time  1600    SLP Time Calculation (min) 30 min    Activity Tolerance Patient tolerated treatment well          Past Medical History:  Diagnosis Date   Anemia    Asthma    COPD (chronic obstructive pulmonary disease) (HCC)    Morbid obesity (HCC)    Neuromuscular disorder (HCC)    Sleep apnea    Past Surgical History:  Procedure Laterality Date   BARIATRIC SURGERY N/A    COLONOSCOPY N/A 04/02/2015   Procedure: COLONOSCOPY;  Surgeon: Donnice Vaughn Manes, MD;  Location: [facility] ENDOSCOPY;  Service: Endoscopy;  Laterality: N/A;   COLONOSCOPY WITH PROPOFOL  N/A 12/01/2014   Procedure: COLONOSCOPY WITH PROPOFOL ;  Surgeon: Donnice Vaughn Manes, MD;  Location: [facility] ENDOSCOPY;  Service: Endoscopy;  Laterality: N/A;   COLONOSCOPY WITH PROPOFOL  N/A 10/27/2018   Procedure: COLONOSCOPY WITH PROPOFOL ;  Surgeon: Toledo, Ladell POUR, MD;  Location: [facility] ENDOSCOPY;  Service: Gastroenterology;  Laterality: N/A;   CORONARY STENT INTERVENTION N/A 12/01/2017   Procedure: CORONARY STENT INTERVENTION;  Surgeon: Ammon Blunt, MD;  Location: [facility] INVASIVE CV LAB;  Service: Cardiovascular;  Laterality: N/A;   CORONARY STENT INTERVENTION N/A 01/05/2023   Procedure: CORONARY STENT INTERVENTION;  Surgeon: Ammon Blunt, MD;  Location: [facility] INVASIVE CV LAB;  Service: Cardiovascular;  Laterality: N/A;   ESOPHAGOGASTRODUODENOSCOPY (EGD) WITH PROPOFOL  N/A 10/27/2018   Procedure: ESOPHAGOGASTRODUODENOSCOPY (EGD) WITH PROPOFOL ;  Surgeon: Toledo,  Ladell POUR, MD;  Location: [facility] ENDOSCOPY;  Service: Gastroenterology;  Laterality: N/A;   LEFT HEART CATH AND CORONARY ANGIOGRAPHY Left 12/01/2017   Procedure: LEFT HEART CATH AND CORONARY ANGIOGRAPHY;  Surgeon: Hester Wolm PARAS, MD;  Location: [facility] INVASIVE CV LAB;  Service: Cardiovascular;  Laterality: Left;   LEFT HEART CATH AND CORONARY ANGIOGRAPHY N/A 01/05/2023   Procedure: LEFT HEART CATH AND CORONARY ANGIOGRAPHY;  Surgeon: Ammon Blunt, MD;  Location: [facility] INVASIVE CV LAB;  Service: Cardiovascular;  Laterality: N/A;   LOWER EXTREMITY ANGIOGRAPHY Right 12/17/2017   Procedure: LOWER EXTREMITY ANGIOGRAPHY;  Surgeon: Marea Selinda RAMAN, MD;  Location: [facility] INVASIVE CV LAB;  Service: Cardiovascular;  Laterality: Right;   PNEUMONECTOMY Left    Patient Active Problem List   Diagnosis Date Noted   Primary osteoarthritis of right knee 10/05/2023   Tricompartment osteoarthritis of right knee 10/05/2023   Chronic pain syndrome 08/31/2023   Pharmacologic therapy 08/31/2023   Disorder of skeletal system 08/31/2023   Problems influencing health status 08/31/2023   Persistent atrial fibrillation (HCC) 08/31/2023   Chronic anticoagulation (Eliquis  + Plavix ) 08/31/2023   Chronic foot pain (1ry area of Pain) (Bilateral) (L>R) 08/31/2023   Chronic knee pain (2ry area of Pain) (Right) 08/31/2023   Chronic shoulder pain (3ry area of Pain) (Bilateral) (R>L) 08/31/2023   Foot fracture, sequela (Left) 08/31/2023   Morbid obesity with BMI of 40.0-44.9, adult (HCC) 08/30/2023   Abdominal pain 08/30/2023   OSA (obstructive sleep apnea) 08/30/2023   HFrEF (heart  failure with reduced ejection fraction) (HCC) 02/05/2023   Acute exacerbation of CHF (congestive heart failure) (HCC) 01/01/2023   Hyperlipidemia 01/01/2023   Primary osteoarthritis involving multiple joints 09/04/2020   H/O bariatric surgery 06/17/2019   B12 deficiency 03/16/2019   Chronic ankle pain (1ry area of Pain) (Bilateral) 08/23/2018    DJD (degenerative joint disease) 08/23/2018   Localized edema 08/23/2018   Osteoarthritis of ankle 08/23/2018   Tibialis posterior tendinitis 08/23/2018   Swelling of ankle joint 08/17/2018   Iron deficiency anemia following bariatric surgery 06/08/2018   Essential hypertension 01/26/2018   Bilateral lower extremity edema 01/14/2018   Varicose veins of bilateral lower extremities with pain 01/14/2018   Morbid obesity (HCC) 12/15/2017   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) 12/15/2017   Coronary atherosclerosis of native coronary artery 12/10/2017   CAD (coronary artery disease) 12/01/2017   Angina decubitus (HCC) 11/24/2017   Stable angina pectoris (HCC) 11/03/2017   Hyperlipidemia, mixed 11/03/2017   Umbilical hernia, incarcerated 11/02/2017   Centrilobular emphysema (HCC) 08/31/2015   Plantar fasciitis 08/31/2015   Anemia 01/23/2015    ONSET DATE: aspiration initially identified on barium swallow on 05/16/225; Modified Barium Swallow on 09/25/2023 date of this referral 09/29/2023  REFERRING DIAG: R13.12 (ICD-10-CM) - Oropharyngeal dysphagia   THERAPY DIAG:  Dysphagia, pharyngeal phase  Rationale for Evaluation and Treatment Rehabilitation  SUBJECTIVE:   PERTINENT HISTORY and DIAGNOSTIC FINDINGS:  Mr. Bair is a 68 y.o.male patient his PMH of CAD (s/p PCI and stent prox LAD 11/2017, s/p PCI and stent to OM2 01/2018, s/p PCI and stent to mid RCA 12/2022), HFrEF w/ EF 25-35% 12/2022, improved to 50% 03/2023 with GDMT, paroxysmal afib (onset 12/2022) on eliquis , HTN, HLD, OSA. Patient is here to establish care with general cardiology. Follows with advanced heart failure regularly last seen by Dr. Nanci Blanch 03/31/2023. Hx of hospitalization 12/2022 for SOB, fatigue, edema. Echo 01/02/23 showed severe LV dysfunction EF 20-25% with global hypokinesis. Underwent LHC 01/05/23 showing patent prior stents to LAD and OM2, PCI and stent to mid RCA lesion during this  catheterization. Had new onset a-fib during hospitalization, discharged on carvedilol , eliquis . Discharged on triple therapy with eliquis , asa, plavix  for 2-3 weeks then d/c asa.He needs to be scheduled for an endoscopy and colonoscopy but there is concern about interrupting his Eliquis  and Plavix  regimen due to the recent stent placement.    In addition,  he has noticed over the past 1-months dealing with solid food and liquid esophageal dysphagia episodes localized to level of xiphoid process. He feels like episodes have evenly between solids and liquids. He feels like things are hanging up at level of xiphoid process and takes >30 seconds to pass. There is no issue with pill dysphagia. He feels like GERD symptoms are well-controlled on once daily PPI. He has had dysphagia for years. Last EGD in 2020 showed no endoscopic abnormality to explain his complaint of dysphagia and empiric dilatation was not performed at this time. He has seed the heart failure clinic at [facility]. He is on Eliquis  and Clopidogrel  daily. He had stent placed in Sept 2024 by Dr. Ammon to his RCA. He is on Eliquis  due to new diagnosis of atrial fibrillation with RVR back in September before heart cath. He is not sure how long he is going to be on Clopidogrel .   DG Esophagus 09/11/2023  FINDINGS: Swallowing: Laryngeal penetration with silent aspiration noted on initial lateral swallows which cleared when prompted to cough. Evaluation subsequently aborted.  MBSS 09/25/2023 Pt presents with mild pharyngeal phase dysphagia that appears to be sensorimotor in nature resulting in intermittent deep penetration and 1 instance of silent aspiration when consuming thin liquids via cup and nectar thick liquids. Specifically, he has delayed swallow initiation to level of pyriform sinuses and decreased base of tongue strength. Pt's silent aspiration of thin liquid bolus occured prior to swallow initiation with bolus falling over base of  tongue into airway.    PAIN:  Are you having pain? No  FALLS: Has patient fallen in last 6 months?  No  LIVING ENVIRONMENT: Lives with: lives with their family Lives in: House/apartment  PLOF:  Level of assistance: Independent with ADLs, Independent with IADLs Employment: Full-time employment   PATIENT GOALS  to improve swallow functions  SUBJECTIVE STATEMENT: Pt missed a session d/t work last week Pt accompanied by: self  OBJECTIVE:   TODAY'S TREATMENT:  Skilled treatment session targeted pt's dysphagia impairment. SLP facilitated session by providing the following interventions:  I left my little tool in the truck and I wasn't about to go back and get it (d/t heat) pt referring to EMST 75 device - he reports that difficulty level is about the same   He reports being able to complete the [facility] but states you are putting me on the spot when this writer asked pt to complete reps within session. Pt with more attempts and task tolerance than previous sessions because he states I know I can do it, I do it at home Pt unable to complete swallow when performing despite maximal effort during today's session   Skilled observation was provided of pt consuming thin liquids via cup. Pt with no overt s/s of aspiration (prior history of silent aspiration  - pt reports I do think that it is helping because I am not coughing as much  Continuous to report a gas bubble down there (pointing to his chest) that won't allow things to go dow, if I can even burp it will allow it go to down, usually I can't burp I have to allow it all to come back up. He reports not rhyme or reason to it, reports that thicker protein shakes have a more difficult tie going down - if I eat too fast or too big of a swallow then it won't go down.   PATIENT EDUCATION: Education details: see above Person educated: Patient Education method: Explanation Education comprehension: verbalized  understanding  HOME EXERCISE PROGRAM:     EMST 3 times per day; perform 3 sets of 8  Pharyngeal strengthening - Masako, CTAR   GOALS: Goals reviewed with patient? Yes  SHORT TERM GOALS: Target date: 10 sessions  With Mod I, pt will demonstrate understanding of result and recommendations of MBS by verbalizing salient points.   Baseline: Goal status: INITIAL  2.  Pt will complete 3 sets of 8 EMST exercises with EMST set at resistance to promote self-reported effort level of 8 out of 10.  Baseline:  Goal status: INITIAL  3.  Pt will perform pharyngeal strengthening exercises to improve timeliness and strength of pharyngeal swallow.  Baseline:  Goal status: INITIAL   LONG TERM GOALS: Target date: 11/24/2023  To determine optimal resistance levels for Respiratory Muscle Training (RMT) for improving increase hyolaryngeal elevation and strengthen cough for airway clearance, patient will participate in evaluation (and re-assessment as needed) of maximum expiratory pressure (MEP). Baseline:  Goal status: INITIAL  2.  With Mod I, pt will utilize safe swallowing strategies in  order to reduce risk of aspiration, dehydration and weight loss with consuming regular textures and thin liquids. Baseline:  Goal status: INITIAL   ASSESSMENT:  CLINICAL IMPRESSION: Patient is a 68 y.o. old who was seen today for a dysphagia treatment following his Modified Barium Swallow Study that revealed mild pharyngeal phase dysphagia. Pt very responsive and engaged with therapy information and exercises.   Will continue ST POT with ST to request MBSS planned for early August. Will reduce ST sessions as pt is independent in knowing how to complete exercises.  See the above note for details.    OBJECTIVE IMPAIRMENTS include dysphagia. These impairments are limiting patient from safety when swallowing.and the ability to improve participation in further esophageal assessment.   Factors affecting potential to  achieve goals and functional outcome are co-morbidities. Patient will benefit from skilled SLP services to address above impairments and improve overall function.  REHAB POTENTIAL: Good  PLAN: SLP FREQUENCY: 1-2x/week  SLP DURATION: 8 weeks  PLANNED INTERVENTIONS: Aspiration precaution training, Pharyngeal strengthening exercises, Diet toleration management , Trials of upgraded texture/liquids, SLP instruction and feedback, Compensatory strategies, and Patient/family education   Jeter Tomey B. Rubbie, M.S., CCC-SLP, Tree surgeon Certified Brain Injury Specialist [facility]  [facility] Services Office [phone] Ascom [phone] Fax [phone]

## 2023-11-11 ENCOUNTER — Ambulatory Visit: Admitting: Speech Pathology

## 2023-11-18 ENCOUNTER — Encounter: Admitting: Speech Pathology

## 2023-11-25 ENCOUNTER — Ambulatory Visit: Attending: Internal Medicine

## 2023-11-25 ENCOUNTER — Encounter: Admitting: Speech Pathology

## 2023-11-25 DIAGNOSIS — G4736 Sleep related hypoventilation in conditions classified elsewhere: Secondary | ICD-10-CM | POA: Insufficient documentation

## 2023-11-25 DIAGNOSIS — I4891 Unspecified atrial fibrillation: Secondary | ICD-10-CM | POA: Insufficient documentation

## 2023-11-25 DIAGNOSIS — G4733 Obstructive sleep apnea (adult) (pediatric): Secondary | ICD-10-CM | POA: Diagnosis not present

## 2023-11-25 DIAGNOSIS — G4710 Hypersomnia, unspecified: Secondary | ICD-10-CM | POA: Diagnosis present

## 2023-12-01 ENCOUNTER — Ambulatory Visit (INDEPENDENT_AMBULATORY_CARE_PROVIDER_SITE_OTHER): Payer: Self-pay | Admitting: Sleep Medicine

## 2023-12-01 DIAGNOSIS — G4733 Obstructive sleep apnea (adult) (pediatric): Secondary | ICD-10-CM | POA: Diagnosis not present

## 2023-12-01 NOTE — Telephone Encounter (Signed)
 Please notify patient that PSG revealed moderate to severe OSA, recommend proceeding with APAP therapy set to 4-20 cm H2O, EPR 3 with the Airtouch N30i nasal mask.

## 2023-12-07 ENCOUNTER — Other Ambulatory Visit: Payer: Self-pay | Admitting: Gastroenterology

## 2023-12-07 DIAGNOSIS — R1312 Dysphagia, oropharyngeal phase: Secondary | ICD-10-CM

## 2023-12-11 ENCOUNTER — Ambulatory Visit
Admission: RE | Admit: 2023-12-11 | Discharge: 2023-12-11 | Disposition: A | Discharge status: Home / Self Care | Source: Ambulatory Visit | Attending: Gastroenterology | Admitting: Gastroenterology

## 2023-12-11 DIAGNOSIS — R1312 Dysphagia, oropharyngeal phase: Secondary | ICD-10-CM | POA: Diagnosis present

## 2023-12-14 NOTE — Procedures (Signed)
 Modified Barium Swallow Study  Patient Details  Name: Spencer Herring MRN: [id number] Date of Birth: 04-28-1956  Today's Date: 12/14/2023  Modified Barium Swallow completed.  Full report located under Chart Review in the Imaging Section.  History of Present Illness Mr. Stecklein is a 68 y.o.male patient his PMH of CAD (s/p PCI and stent prox LAD 11/2017, s/p PCI and stent to OM2 01/2018, s/p PCI and stent to mid RCA 12/2022), HFrEF w/ EF 25-35% 12/2022, improved to 50% 03/2023 with GDMT, paroxysmal afib (onset 12/2022) on eliquis , HTN, HLD, OSA. Patient is here to establish care with general cardiology. Follows with advanced heart failure regularly last seen by Dr. Nanci Blanch 03/31/2023. Hx of hospitalization 12/2022 for SOB, fatigue, edema. Echo 01/02/23 showed severe LV dysfunction EF 20-25% with global hypokinesis. Underwent LHC 01/05/23 showing patent prior stents to LAD and OM2, PCI and stent to mid RCA lesion during this catheterization. Had new onset a-fib during hospitalization, discharged on carvedilol , eliquis . Discharged on triple therapy with eliquis , asa, plavix  for 2-3 weeks then d/c asa.He needs to be scheduled for an endoscopy and colonoscopy but there is concern about interrupting his Eliquis  and Plavix  regimen due to the recent stent placement.      In addition,  he has noticed over the past 30-months dealing with solid food and liquid esophageal dysphagia episodes localized to level of xiphoid process. He feels like episodes have evenly between solids and liquids. He feels like things are hanging up at level of xiphoid process and takes >30 seconds to pass. There is no issue with pill dysphagia. He feels like GERD symptoms are well-controlled on once daily PPI. He has had dysphagia for years. Last EGD in 2020 showed no endoscopic abnormality to explain his complaint of dysphagia and empiric dilatation was not performed at this time. He has seed the heart failure clinic at [facility]. He is on Eliquis   and Clopidogrel  daily. He had stent placed in Sept 2024 by Dr. Ammon to his RCA. He is on Eliquis  due to new diagnosis of atrial fibrillation with RVR back in September before heart cath. He is not sure how long he is going to be on Clopidogrel .     DG Esophagus 09/11/2023   FINDINGS:  Swallowing: Laryngeal penetration with silent aspiration noted on  initial lateral swallows which cleared when prompted to cough.  Evaluation subsequently aborted.   Clinical Impression Pt presents for repeat Modified Barium Swallow Study (initial study 09/25/2023 - revealed silent aspiration; pt participated in Outpatient ST services targeting pharyngeal strengthening). Pt presents with improved oropharyngeal abilities. As a result his risk of aspiration is reduced when consuming thin liquids via single cup sips. Pt's pharyngeal response is more timely and demonstrates greater motor response. When consuming consecutive cup sips of thin liquids, the second swallow was observed to penetration laryngeal vestibule but aspiration was not observed during the second sip. At this time, recommend pt adhere to general aspiration precautions and consume single small sips of thin liquids via cup. All education has been completed and no further services are indicated at this time. Factors that may increase risk of adverse event in presence of aspiration Noe & Lianne 2021):  N/a  Swallow Evaluation Recommendations Recommendations: PO diet PO Diet Recommendation: Regular;Thin liquids (Level 0) Liquid Administration via: Cup Medication Administration: Whole meds with liquid Supervision: Patient able to self-feed Swallowing strategies  : Minimize environmental distractions;Slow rate;Small bites/sips Postural changes: Position pt fully upright for meals;Stay upright 30-60 min after meals Oral  care recommendations: Oral care BID (2x/day)    Bunny Kleist B. Rubbie, M.S., CCC-SLP, Tree surgeon Certified Brain  Injury Specialist [facility]  [facility] Rehabilitation Services Office [phone] Ascom [phone] Fax [phone]

## 2024-01-21 ENCOUNTER — Encounter: Payer: Self-pay | Admitting: Urgent Care

## 2024-01-21 ENCOUNTER — Other Ambulatory Visit: Payer: Self-pay | Admitting: Orthopedic Surgery

## 2024-01-21 DIAGNOSIS — M25511 Pain in right shoulder: Secondary | ICD-10-CM

## 2024-01-21 DIAGNOSIS — S46001A Unspecified injury of muscle(s) and tendon(s) of the rotator cuff of right shoulder, initial encounter: Secondary | ICD-10-CM

## 2024-01-21 DIAGNOSIS — M25311 Other instability, right shoulder: Secondary | ICD-10-CM

## 2024-01-29 ENCOUNTER — Ambulatory Visit
Admission: RE | Admit: 2024-01-29 | Discharge: 2024-01-29 | Disposition: A | Discharge status: Home / Self Care | Source: Ambulatory Visit | Attending: Orthopedic Surgery | Admitting: Orthopedic Surgery

## 2024-01-29 DIAGNOSIS — M25311 Other instability, right shoulder: Secondary | ICD-10-CM

## 2024-01-29 DIAGNOSIS — M25511 Pain in right shoulder: Secondary | ICD-10-CM

## 2024-01-29 DIAGNOSIS — S46001A Unspecified injury of muscle(s) and tendon(s) of the rotator cuff of right shoulder, initial encounter: Secondary | ICD-10-CM

## 2024-03-01 ENCOUNTER — Encounter: Payer: Self-pay | Admitting: Internal Medicine

## 2024-03-09 ENCOUNTER — Encounter
Admission: RE | Disposition: A | Discharge status: Home / Self Care | Payer: Self-pay | Source: Home / Self Care | Attending: Internal Medicine

## 2024-03-09 ENCOUNTER — Ambulatory Visit: Admitting: Certified Registered Nurse Anesthetist

## 2024-03-09 ENCOUNTER — Ambulatory Visit
Admission: RE | Admit: 2024-03-09 | Discharge: 2024-03-09 | Disposition: A | Discharge status: Home / Self Care | Attending: Internal Medicine | Admitting: Internal Medicine

## 2024-03-09 ENCOUNTER — Encounter: Payer: Self-pay | Admitting: Internal Medicine

## 2024-03-09 DIAGNOSIS — Z7901 Long term (current) use of anticoagulants: Secondary | ICD-10-CM | POA: Diagnosis not present

## 2024-03-09 DIAGNOSIS — I5022 Chronic systolic (congestive) heart failure: Secondary | ICD-10-CM | POA: Diagnosis not present

## 2024-03-09 DIAGNOSIS — K5730 Diverticulosis of large intestine without perforation or abscess without bleeding: Secondary | ICD-10-CM | POA: Insufficient documentation

## 2024-03-09 DIAGNOSIS — Z955 Presence of coronary angioplasty implant and graft: Secondary | ICD-10-CM | POA: Insufficient documentation

## 2024-03-09 DIAGNOSIS — Z6836 Body mass index (BMI) 36.0-36.9, adult: Secondary | ICD-10-CM | POA: Insufficient documentation

## 2024-03-09 DIAGNOSIS — Z87891 Personal history of nicotine dependence: Secondary | ICD-10-CM | POA: Diagnosis not present

## 2024-03-09 DIAGNOSIS — G4733 Obstructive sleep apnea (adult) (pediatric): Secondary | ICD-10-CM | POA: Diagnosis not present

## 2024-03-09 DIAGNOSIS — Z860101 Personal history of adenomatous and serrated colon polyps: Secondary | ICD-10-CM | POA: Diagnosis not present

## 2024-03-09 DIAGNOSIS — K3189 Other diseases of stomach and duodenum: Secondary | ICD-10-CM | POA: Diagnosis not present

## 2024-03-09 DIAGNOSIS — Q438 Other specified congenital malformations of intestine: Secondary | ICD-10-CM | POA: Insufficient documentation

## 2024-03-09 DIAGNOSIS — I110 Hypertensive heart disease with heart failure: Secondary | ICD-10-CM | POA: Insufficient documentation

## 2024-03-09 DIAGNOSIS — E66813 Obesity, class 3: Secondary | ICD-10-CM | POA: Diagnosis not present

## 2024-03-09 DIAGNOSIS — Z1211 Encounter for screening for malignant neoplasm of colon: Secondary | ICD-10-CM | POA: Insufficient documentation

## 2024-03-09 DIAGNOSIS — J449 Chronic obstructive pulmonary disease, unspecified: Secondary | ICD-10-CM | POA: Insufficient documentation

## 2024-03-09 DIAGNOSIS — K259 Gastric ulcer, unspecified as acute or chronic, without hemorrhage or perforation: Secondary | ICD-10-CM | POA: Insufficient documentation

## 2024-03-09 DIAGNOSIS — I480 Paroxysmal atrial fibrillation: Secondary | ICD-10-CM | POA: Diagnosis not present

## 2024-03-09 DIAGNOSIS — Z79899 Other long term (current) drug therapy: Secondary | ICD-10-CM | POA: Diagnosis not present

## 2024-03-09 DIAGNOSIS — Z9884 Bariatric surgery status: Secondary | ICD-10-CM | POA: Insufficient documentation

## 2024-03-09 DIAGNOSIS — R1314 Dysphagia, pharyngoesophageal phase: Secondary | ICD-10-CM | POA: Diagnosis not present

## 2024-03-09 DIAGNOSIS — K640 First degree hemorrhoids: Secondary | ICD-10-CM | POA: Insufficient documentation

## 2024-03-09 DIAGNOSIS — Z8711 Personal history of peptic ulcer disease: Secondary | ICD-10-CM | POA: Diagnosis not present

## 2024-03-09 DIAGNOSIS — I420 Dilated cardiomyopathy: Secondary | ICD-10-CM | POA: Diagnosis not present

## 2024-03-09 DIAGNOSIS — I25119 Atherosclerotic heart disease of native coronary artery with unspecified angina pectoris: Secondary | ICD-10-CM | POA: Insufficient documentation

## 2024-03-09 HISTORY — PX: COLONOSCOPY: SHX5424

## 2024-03-09 HISTORY — DX: Atherosclerotic heart disease of native coronary artery without angina pectoris: I25.10

## 2024-03-09 HISTORY — DX: Angina pectoris, unspecified: I20.9

## 2024-03-09 HISTORY — PX: ESOPHAGOGASTRODUODENOSCOPY: SHX5428

## 2024-03-09 HISTORY — DX: Essential (primary) hypertension: I10

## 2024-03-09 LAB — KOH PREP
KOH Prep: NONE SEEN
Special Requests: NORMAL

## 2024-03-09 SURGERY — EGD (ESOPHAGOGASTRODUODENOSCOPY)
Anesthesia: General

## 2024-03-09 MED ORDER — SODIUM CHLORIDE 0.9 % IV SOLN: INTRAVENOUS | Status: DC

## 2024-03-09 MED ORDER — GLYCOPYRROLATE 0.2 MG/ML IJ SOLN
INTRAMUSCULAR | Status: DC | PRN
  Administered 2024-03-09: .2 mg via INTRAVENOUS

## 2024-03-09 MED ORDER — LIDOCAINE HCL (CARDIAC) PF 100 MG/5ML IV SOSY
PREFILLED_SYRINGE | INTRAVENOUS | Status: DC | PRN
  Administered 2024-03-09: 50 mg via INTRAVENOUS

## 2024-03-09 MED ORDER — PROPOFOL 10 MG/ML IV BOLUS
INTRAVENOUS | Status: DC | PRN
  Administered 2024-03-09: 60 mg via INTRAVENOUS
  Administered 2024-03-09: 150 ug/kg/min via INTRAVENOUS
  Administered 2024-03-09: 100 mg via INTRAVENOUS

## 2024-03-09 MED ORDER — DEXMEDETOMIDINE HCL IN NACL 80 MCG/20ML IV SOLN
INTRAVENOUS | Status: DC | PRN
  Administered 2024-03-09: 12 ug via INTRAVENOUS

## 2024-03-09 NOTE — Anesthesia Postprocedure Evaluation (Signed)
 Anesthesia Post Note  Patient: Spencer Herring  Procedure(s) Performed: EGD (ESOPHAGOGASTRODUODENOSCOPY) COLONOSCOPY  Patient location during evaluation: PACU Anesthesia Type: General Level of consciousness: awake and alert Pain management: satisfactory to patient Vital Signs Assessment: post-procedure vital signs reviewed and stable Respiratory status: spontaneous breathing Cardiovascular status: stable Anesthetic complications: no   No notable events documented.   Last Vitals:  Vitals:   03/09/24 1059 03/09/24 1109  BP: 107/79 119/78  Pulse:  66  Resp: 13 15  Temp:    SpO2: 100% 98%    Last Pain:  Vitals:   03/09/24 1049  TempSrc: Temporal  PainSc: 0-No pain                 VAN STAVEREN,Junice Fei

## 2024-03-09 NOTE — Interval H&P Note (Signed)
 History and Physical Interval Note:  03/09/2024 9:57 AM  Gearold R Propst  has presented today for surgery, with the diagnosis of dysphagia aden colon polyp.  The various methods of treatment have been discussed with the patient and family. After consideration of risks, benefits and other options for treatment, the patient has consented to  Procedure(s): EGD (ESOPHAGOGASTRODUODENOSCOPY) (N/A) COLONOSCOPY (N/A) as a surgical intervention.  The patient's history has been reviewed, patient examined, no change in status, stable for surgery.  I have reviewed the patient's chart and labs.  Questions were answered to the patient's satisfaction.     [place], Michell Kader

## 2024-03-09 NOTE — H&P (Signed)
 Outpatient short stay form Pre-procedure 03/09/2024 9:55 AM Kaitelyn Jamison K. Aundria, M.D.  Primary Physician: Tamra Leventhal, M.D.  Reason for visit:  Esophageal dysphagia, s/p sleeve gastrectomy, history of adenomatous colon polyps, history of peptic ulcer disease.  History of present illness:  68 y/o Caucasian male with a PMH of obesity (BMI 36), HTN, HLD, anxiety, HFrEF, dilated cardiomyopathy, CAD s/p PCI to LAD 10/2017, PCI to OM 2 01/2018, and PCI/DES to mid RCA 01/05/2023, OSA on CPAP, COPD, hx of bariatric surgery, and PAF on chronic anticoagulation presents to the [facility] for chief complaint of esophageal dysphagia and high-risk colon cancer surveillance     EGD 10/27/2018 - no endoscopic abnormality evident in esophagus to explain patient's complaint of dysphagia, 2 cm hiatal hernia, no evidence of stenosis, stricture, signs of external compression or lower esophageal sphincter tone abnormalities in the entire esophagus, gastritis, one non-bleeding superficial gastric ulcer with no stigmata of bleeding found in antrum, one non-bleeding superficial gastric ulcer with no stigmata of bleeding found at the pylorus, evidence of sleeve gastrectomy characterized by healthy appearing mucosa   CSY 10/27/2018 - left-sided diverticulosis, one 7 mm SSP removed from transverse colon, one 4 mm TA removed from rectum, tattoo seen in distal ascending colon tattoo site appeared normal, non-bleeding Grade I IH   Medications Prior to Admission  Medication Sig Dispense Refill Last Dose/Taking   apixaban  (ELIQUIS ) 5 MG TABS tablet Take 1 tablet (5 mg total) by mouth 2 (two) times daily. 60 tablet 1 03/06/2024   carvedilol  (COREG ) 25 MG tablet Take 1 tablet (25 mg total) by mouth 2 (two) times daily with a meal. 60 tablet 1 Past Week   furosemide  (LASIX ) 20 MG tablet Take 1 tablet (20 mg total) by mouth daily. (Patient taking differently: Take 40 mg by mouth daily.) 30 tablet 1 Past Week   spironolactone   (ALDACTONE ) 25 MG tablet Take 0.5 tablets (12.5 mg total) by mouth daily. 15 tablet 1 Past Week   albuterol  (ACCUNEB ) 0.63 MG/3ML nebulizer solution Take 1 ampule by nebulization every 4 (four) hours as needed for wheezing or shortness of breath.      albuterol  (VENTOLIN  HFA) 108 (90 Base) MCG/ACT inhaler Inhale 2 puffs into the lungs every 4 (four) hours as needed for wheezing or shortness of breath.      ALPRAZolam  (XANAX ) 0.25 MG tablet Take 0.25 mg by mouth at bedtime.      amiodarone (PACERONE) 200 MG tablet Take 200 mg by mouth 2 (two) times daily. (Patient not taking: Reported on 03/09/2024)   Not Taking   atorvastatin  (LIPITOR ) 40 MG tablet Take 40 mg by mouth daily.      atorvastatin  (LIPITOR ) 80 MG tablet Take 0.5 tablets (40 mg total) by mouth daily. (Patient not taking: Reported on 09/18/2023) 30 tablet 1    budesonide -formoterol (SYMBICORT) 160-4.5 MCG/ACT inhaler Inhale 2 puffs into the lungs daily. (Patient not taking: Reported on 03/09/2024)   Not Taking   clopidogrel  (PLAVIX ) 75 MG tablet Take 75 mg by mouth daily. (Patient not taking: Reported on 03/09/2024)   Completed Course   ENTRESTO 49-51 MG Take 0.5-1 tablets by mouth 2 (two) times daily. Take 0.5 tablet by mouth in the morning and 1 tablet at night      FARXIGA  10 MG TABS tablet Take 10 mg by mouth daily.      ferrous sulfate  325 (65 FE) MG tablet Take 325 mg by mouth daily with breakfast.   03/04/2024   gabapentin  (NEURONTIN )  300 MG capsule Take 300-600 mg by mouth See admin instructions. Take 300 mg by mouth in the morning and afternoon and take 600 mg at night      naproxen sodium (ALEVE) 220 MG tablet Take 220 mg by mouth daily as needed (pain).      nitroGLYCERIN  (NITROSTAT ) 0.4 MG SL tablet Place 0.4 mg under the tongue every 5 (five) minutes as needed.       NON FORMULARY Pt uses a c-pap nightly      oxyCODONE -acetaminophen  (PERCOCET) 7.5-325 MG per tablet Take 1 tablet by mouth 2 (two) times daily as needed for severe  pain.      pantoprazole  (PROTONIX ) 40 MG tablet Take 40 mg by mouth daily.      ranolazine  (RANEXA ) 500 MG 12 hr tablet Take 500 mg by mouth 2 (two) times daily.      traMADol  (ULTRAM ) 50 MG tablet Take 50 mg by mouth in the morning and at bedtime. (Patient not taking: Reported on 03/09/2024)   Not Taking     Allergies  Allergen Reactions   Latex Rash     Past Medical History:  Diagnosis Date   Anemia    Angina pectoris    Asthma    COPD (chronic obstructive pulmonary disease) (HCC)    Coronary artery disease    Hypertension    Morbid obesity (HCC)    Neuromuscular disorder (HCC)    Sleep apnea     Review of systems:  Otherwise negative.    Physical Exam  Gen: Alert, oriented. Appears stated age.  HEENT: [place]/AT. PERRLA. Lungs: CTA, no wheezes. CV: RR nl S1, S2. Abd: soft, benign, no masses. BS+ Ext: No edema. Pulses 2+    Planned procedures: Proceed with EGD and colonoscopy. The patient understands the nature of the planned procedure, indications, risks, alternatives and potential complications including but not limited to bleeding, infection, perforation, damage to internal organs and possible oversedation/side effects from anesthesia. The patient agrees and gives consent to proceed.  Please refer to procedure notes for findings, recommendations and patient disposition/instructions.     Hodges Treiber K. Aundria, M.D. Gastroenterology 03/09/2024  9:55 AM

## 2024-03-09 NOTE — Op Note (Signed)
 [facility] Gastroenterology Patient Name: Spencer Herring Procedure Date: 03/09/2024 9:56 AM MRN: [id number] Account #: [id number] Date of Birth: 1955/09/02 Admit Type: Outpatient Age: 68 Room: [facility] ENDO ROOM 2 Gender: Male Note Status: Finalized Instrument Name: Upper GI Scope [id number] Procedure:             Upper GI endoscopy Indications:           Esophageal dysphagia, Personal history of peptic ulcer                         disease Providers:             Brealynn Contino K. Aundria MD, MD Referring MD:          Tamra Leventhal, MD (Referring MD) Medicines:             Propofol  per Anesthesia Complications:         No immediate complications. Estimated blood loss:                         Minimal. Procedure:             Pre-Anesthesia Assessment:                        - The risks and benefits of the procedure and the                         sedation options and risks were discussed with the                         patient. All questions were answered and informed                         consent was obtained.                        - Patient identification and proposed procedure were                         verified prior to the procedure by the nurse. The                         procedure was verified in the procedure room.                        - ASA Grade Assessment: III - A patient with severe                         systemic disease.                        - After reviewing the risks and benefits, the patient                         was deemed in satisfactory condition to undergo the                         procedure.                        After obtaining informed consent,  the endoscope was                         passed under direct vision. Throughout the procedure,                         the patient's blood pressure, pulse, and oxygen                         saturations were monitored continuously. The Endoscope                         was introduced through  the mouth, and advanced to the                         third part of duodenum. The upper GI endoscopy was                         accomplished without difficulty. The patient tolerated                         the procedure well. Findings:      Patchy, white plaques were found in the entire esophagus. Cells for KOH       were obtained by brushing.      The exam of the esophagus was otherwise normal.      Two non-bleeding cratered gastric ulcers with no stigmata of bleeding       were found in the gastric antrum. The largest lesion was 16 mm in       largest dimension. Biopsies were taken with a cold forceps for       histology. Estimated blood loss was minimal.      The exam of the stomach was otherwise normal.      The examined duodenum was normal. Impression:            - Esophageal plaques were found, consistent with                         candidiasis. Cells for cytology obtained.                        - Non-bleeding gastric ulcers with no stigmata of                         bleeding. Biopsied.                        - Normal examined duodenum. Recommendation:        - Await pathology results.                        - Use Protonix  (pantoprazole ) 40 mg PO daily.                        - No aspirin , ibuprofen, naproxen, or other                         non-steroidal anti-inflammatory drugs.                        -  Resume Eliquis  (apixaban ) at prior dose tomorrow.                         Refer to managing physician for further adjustment of                         therapy.                        - Proceed with colonoscopy Procedure Code(s):     --- Professional ---                        [phone], Esophagogastroduodenoscopy, flexible,                         transoral; with biopsy, single or multiple Diagnosis Code(s):     --- Professional ---                        Z87.11, Personal history of peptic ulcer disease                        R13.14, Dysphagia, pharyngoesophageal phase                         K25.9, Gastric ulcer, unspecified as acute or chronic,                         without hemorrhage or perforation                        K22.9, Disease of esophagus, unspecified CPT copyright 2022 American [employer]. All rights reserved. The codes documented in this report are preliminary and upon coder review may  be revised to meet current compliance requirements. [name] MD, MD 03/09/2024 10:24:25 AM This report has been signed electronically. Number of Addenda: 0 Note Initiated On: 03/09/2024 9:56 AM Estimated Blood Loss:  Estimated blood loss was minimal.      [facility]

## 2024-03-09 NOTE — Anesthesia Preprocedure Evaluation (Signed)
 Anesthesia Evaluation  Patient identified by MRN, date of birth, ID band Patient awake    Reviewed: Allergy & Precautions, NPO status , Patient's Chart, lab work & pertinent test results  Airway Mallampati: III  TM Distance: >3 FB Neck ROM: full    Dental  (+) Missing, Poor Dentition   Pulmonary neg pulmonary ROS, sleep apnea and Continuous Positive Airway Pressure Ventilation , COPD, former smoker   Pulmonary exam normal  + decreased breath sounds      Cardiovascular Exercise Tolerance: Good hypertension, Pt. on medications + angina  + CAD and +CHF  negative cardio ROS Normal cardiovascular exam+ dysrhythmias Atrial Fibrillation  Rhythm:Regular     Neuro/Psych negative neurological ROS  negative psych ROS   GI/Hepatic negative GI ROS, Neg liver ROS,,,  Endo/Other  negative endocrine ROS  Class 3 obesity  Renal/GU negative Renal ROS  negative genitourinary   Musculoskeletal  (+) Arthritis ,    Abdominal   Peds negative pediatric ROS (+)  Hematology negative hematology ROS (+) Blood dyscrasia, anemia   Anesthesia Other Findings Past Medical History: No date: Anemia No date: Angina pectoris No date: Asthma No date: COPD (chronic obstructive pulmonary disease) (HCC) No date: Coronary artery disease No date: Hypertension No date: Morbid obesity (HCC) No date: Neuromuscular disorder (HCC) No date: Sleep apnea  Past Surgical History: No date: BARIATRIC SURGERY; N/A 04/02/2015: COLONOSCOPY; N/A     Comment:  Procedure: COLONOSCOPY;  Surgeon: Donnice Vaughn Manes,               MD;  Location: [facility] ENDOSCOPY;  Service: Endoscopy;                Laterality: N/A; 12/01/2014: COLONOSCOPY WITH PROPOFOL ; N/A     Comment:  Procedure: COLONOSCOPY WITH PROPOFOL ;  Surgeon: Donnice Vaughn Manes, MD;  Location: [facility] ENDOSCOPY;  Service:               Endoscopy;  Laterality: N/A; 10/27/2018: COLONOSCOPY WITH  PROPOFOL ; N/A     Comment:  Procedure: COLONOSCOPY WITH PROPOFOL ;  Surgeon: Toledo,               Ladell POUR, MD;  Location: [facility] ENDOSCOPY;  Service:               Gastroenterology;  Laterality: N/A; No date: CORONARY ANGIOPLASTY 12/01/2017: CORONARY STENT INTERVENTION; N/A     Comment:  Procedure: CORONARY STENT INTERVENTION;  Surgeon:               Ammon Blunt, MD;  Location: [facility] INVASIVE CV               LAB;  Service: Cardiovascular;  Laterality: N/A; 01/05/2023: CORONARY STENT INTERVENTION; N/A     Comment:  Procedure: CORONARY STENT INTERVENTION;  Surgeon:               Ammon Blunt, MD;  Location: [facility] INVASIVE CV               LAB;  Service: Cardiovascular;  Laterality: N/A; 10/27/2018: ESOPHAGOGASTRODUODENOSCOPY (EGD) WITH PROPOFOL ; N/A     Comment:  Procedure: ESOPHAGOGASTRODUODENOSCOPY (EGD) WITH               PROPOFOL ;  Surgeon: Toledo, Ladell POUR, MD;  Location:               [facility] ENDOSCOPY;  Service: Gastroenterology;  Laterality:  N/A; 12/01/2017: LEFT HEART CATH AND CORONARY ANGIOGRAPHY; Left     Comment:  Procedure: LEFT HEART CATH AND CORONARY ANGIOGRAPHY;                Surgeon: Hester Wolm PARAS, MD;  Location: [facility] INVASIVE               CV LAB;  Service: Cardiovascular;  Laterality: Left; 01/05/2023: LEFT HEART CATH AND CORONARY ANGIOGRAPHY; N/A     Comment:  Procedure: LEFT HEART CATH AND CORONARY ANGIOGRAPHY;                Surgeon: Ammon Blunt, MD;  Location: [facility]               INVASIVE CV LAB;  Service: Cardiovascular;  Laterality:               N/A; 12/17/2017: LOWER EXTREMITY ANGIOGRAPHY; Right     Comment:  Procedure: LOWER EXTREMITY ANGIOGRAPHY;  Surgeon: Marea Selinda RAMAN, MD;  Location: [facility] INVASIVE CV LAB;  Service:               Cardiovascular;  Laterality: Right; No date: PNEUMONECTOMY; Left  BMI    Body Mass Index: 37.86 kg/m      Reproductive/Obstetrics negative OB ROS                               Anesthesia Physical Anesthesia Plan  ASA: 3  Anesthesia Plan: General   Post-op Pain Management:    Induction: Intravenous  PONV Risk Score and Plan: Propofol  infusion and TIVA  Airway Management Planned: Natural Airway and Nasal Cannula  Additional Equipment:   Intra-op Plan:   Post-operative Plan:   Informed Consent: I have reviewed the patients History and Physical, chart, labs and discussed the procedure including the risks, benefits and alternatives for the proposed anesthesia with the patient or authorized representative who has indicated his/her understanding and acceptance.     Dental Advisory Given  Plan Discussed with: CRNA  Anesthesia Plan Comments:         Anesthesia Quick Evaluation

## 2024-03-09 NOTE — Op Note (Signed)
 [facility] Gastroenterology Patient Name: Spencer Herring Procedure Date: 03/09/2024 9:55 AM MRN: [id number] Account #: [id number] Date of Birth: 08/26/1955 Admit Type: Outpatient Age: 68 Room: [facility] ENDO ROOM 2 Gender: Male Note Status: Finalized Instrument Name: Colon Scope [id number] Procedure:             Colonoscopy Indications:           High risk colon cancer surveillance: Personal history                         of non-advanced adenoma, High risk colon cancer                         surveillance: Personal history of sessile serrated                         colon polyp (less than 10 mm in size) with no dysplasia Providers:             Aliya Sol K. Aundria MD, MD Referring MD:          Tamra Leventhal, MD (Referring MD) Medicines:             Propofol  per Anesthesia Complications:         No immediate complications. Estimated blood loss: None. Procedure:             Pre-Anesthesia Assessment:                        - Prior to the procedure, a History and Physical was                         performed, and patient medications, allergies and                         sensitivities were reviewed. The patient's tolerance                         of previous anesthesia was reviewed.                        - The risks and benefits of the procedure and the                         sedation options and risks were discussed with the                         patient. All questions were answered and informed                         consent was obtained.                        - Patient identification and proposed procedure were                         verified prior to the procedure by the nurse. The                         procedure was verified in the procedure room.                        -  ASA Grade Assessment: III - A patient with severe                         systemic disease.                        - After reviewing the risks and benefits, the patient                          was deemed in satisfactory condition to undergo the                         procedure.                        After obtaining informed consent, the colonoscope was                         passed under direct vision. Throughout the procedure,                         the patient's blood pressure, pulse, and oxygen                         saturations were monitored continuously. The                         Colonoscope was introduced through the anus and                         advanced to the the cecum, identified by appendiceal                         orifice and ileocecal valve. The colonoscopy was                         technically difficult and complex due to a redundant                         colon and significant looping. Successful completion                         of the procedure was aided by applying abdominal                         pressure. The patient tolerated the procedure well.                         The quality of the bowel preparation was adequate. The                         ileocecal valve, appendiceal orifice, and rectum were                         photographed. Findings:      The perianal and digital rectal examinations were normal. Pertinent       negatives include normal sphincter tone and no palpable rectal lesions.      Multiple medium-mouthed and small-mouthed diverticula were found in the  sigmoid colon.      Non-bleeding internal hemorrhoids were found during retroflexion. The       hemorrhoids were Grade I (internal hemorrhoids that do not prolapse).      The exam was otherwise without abnormality. Impression:            - Diverticulosis in the sigmoid colon.                        - Non-bleeding internal hemorrhoids.                        - The examination was otherwise normal.                        - No specimens collected. Recommendation:        - Patient has a contact number available for                         emergencies. The signs and  symptoms of potential                         delayed complications were discussed with the patient.                         Return to normal activities tomorrow. Written                         discharge instructions were provided to the patient.                        - Resume Eliquis  (apixaban ) at prior dose tomorrow.                         Refer to managing physician for further adjustment of                         therapy.                        - Repeat colonoscopy in 10 years for screening                         purposes.                        - Follow up with Jonette Primmer, PA-C in the GI office.                         [phone]                        - Telephone GI office to schedule appointment in 2                         months.                        - Use Protonix  (pantoprazole ) 40 mg PO daily.                        - No  aspirin , ibuprofen, naproxen, or other                         non-steroidal anti-inflammatory drugs.                        - The findings and recommendations were discussed with                         the patient. Procedure Code(s):     --- Professional ---                        H9894, Colorectal cancer screening; colonoscopy on                         individual at high risk Diagnosis Code(s):     --- Professional ---                        K57.30, Diverticulosis of large intestine without                         perforation or abscess without bleeding                        Z86.010, Personal history of colonic polyps                        K64.0, First degree hemorrhoids CPT copyright 2022 American Medical Association. All rights reserved. The codes documented in this report are preliminary and upon coder review may  be revised to meet current compliance requirements. [name] MD, MD 03/09/2024 10:48:28 AM This report has been signed electronically. Number of Addenda: 0 Note Initiated On: 03/09/2024 9:55 AM Scope Withdrawal  Time: 0 hours 6 minutes 24 seconds  Total Procedure Duration: 0 hours 16 minutes 9 seconds  Estimated Blood Loss:  Estimated blood loss: none.      [facility]

## 2024-03-09 NOTE — Brief Op Note (Signed)
Esophageal brushing for KOH/Candida sent to lab

## 2024-03-09 NOTE — Transfer of Care (Signed)
 Immediate Anesthesia Transfer of Care Note  Patient: Spencer Herring  Procedure(s) Performed: EGD (ESOPHAGOGASTRODUODENOSCOPY) COLONOSCOPY  Patient Location: Endoscopy Unit  Anesthesia Type:General  Level of Consciousness: awake, alert , and oriented  Airway & Oxygen Therapy: Patient Spontanous Breathing  Post-op Assessment: Report given to RN and Post -op Vital signs reviewed and stable  Post vital signs: Reviewed and stable  Last Vitals:  Vitals Value Taken Time  BP 86/72 03/09/24 10:49  Temp    Pulse 83 03/09/24 10:49  Resp 12 03/09/24 10:49  SpO2 97 % 03/09/24 10:49  Vitals shown include unfiled device data.  Last Pain:  Vitals:   03/09/24 0857  TempSrc: Temporal  PainSc: 6          Complications: No notable events documented.

## 2024-03-09 NOTE — Anesthesia Procedure Notes (Signed)
 Date/Time: 03/09/2024 10:07 AM  Performed by: Dominica Krabbe, CRNAPre-anesthesia Checklist: Patient identified, Emergency Drugs available, Suction available, Patient being monitored and Timeout performed Patient Re-evaluated:Patient Re-evaluated prior to induction Oxygen Delivery Method: Nasal cannula Preoxygenation: Pre-oxygenation with 100% oxygen Induction Type: IV induction

## 2024-03-10 LAB — SURGICAL PATHOLOGY
